# Patient Record
Sex: Male | Born: 1945 | Race: White | Hispanic: No | Marital: Married | Smoking: Never smoker
Health system: Southern US, Community
[De-identification: ages and names within clinical notes are randomized; demographics above are authoritative.]

## PROBLEM LIST (undated history)

## (undated) DIAGNOSIS — T4145XA Adverse effect of unspecified anesthetic, initial encounter: Secondary | ICD-10-CM

## (undated) DIAGNOSIS — R112 Nausea with vomiting, unspecified: Secondary | ICD-10-CM

## (undated) DIAGNOSIS — K8020 Calculus of gallbladder without cholecystitis without obstruction: Secondary | ICD-10-CM

## (undated) DIAGNOSIS — K219 Gastro-esophageal reflux disease without esophagitis: Secondary | ICD-10-CM

## (undated) DIAGNOSIS — Z9889 Other specified postprocedural states: Secondary | ICD-10-CM

## (undated) DIAGNOSIS — T8859XA Other complications of anesthesia, initial encounter: Secondary | ICD-10-CM

## (undated) DIAGNOSIS — B20 Human immunodeficiency virus [HIV] disease: Secondary | ICD-10-CM

## (undated) HISTORY — PX: MELANOMA EXCISION: SHX5266

## (undated) HISTORY — PX: EYE SURGERY: SHX253

## (undated) HISTORY — PX: INNER EAR SURGERY: SHX679

## (undated) HISTORY — PX: HERNIA REPAIR: SHX51

---

## 2001-11-27 ENCOUNTER — Encounter (INDEPENDENT_AMBULATORY_CARE_PROVIDER_SITE_OTHER): Payer: Self-pay | Admitting: *Deleted

## 2001-11-27 ENCOUNTER — Ambulatory Visit (HOSPITAL_COMMUNITY): Admission: RE | Admit: 2001-11-27 | Discharge: 2001-11-27 | Payer: Self-pay | Admitting: Internal Medicine

## 2001-11-27 ENCOUNTER — Encounter: Payer: Self-pay | Admitting: Internal Medicine

## 2001-11-27 ENCOUNTER — Encounter: Admission: RE | Admit: 2001-11-27 | Discharge: 2001-11-27 | Payer: Self-pay | Admitting: Internal Medicine

## 2001-11-27 LAB — CONVERTED CEMR LAB: CD4 Count: 110 microliters

## 2001-12-25 ENCOUNTER — Encounter: Admission: RE | Admit: 2001-12-25 | Discharge: 2001-12-25 | Payer: Self-pay | Admitting: Internal Medicine

## 2001-12-25 ENCOUNTER — Ambulatory Visit (HOSPITAL_COMMUNITY): Admission: RE | Admit: 2001-12-25 | Discharge: 2001-12-25 | Payer: Self-pay | Admitting: Internal Medicine

## 2002-01-08 ENCOUNTER — Encounter: Admission: RE | Admit: 2002-01-08 | Discharge: 2002-01-08 | Payer: Self-pay | Admitting: Internal Medicine

## 2002-03-19 ENCOUNTER — Encounter: Admission: RE | Admit: 2002-03-19 | Discharge: 2002-03-19 | Payer: Self-pay | Admitting: Internal Medicine

## 2002-03-19 ENCOUNTER — Ambulatory Visit (HOSPITAL_COMMUNITY): Admission: RE | Admit: 2002-03-19 | Discharge: 2002-03-19 | Payer: Self-pay | Admitting: Internal Medicine

## 2002-04-02 ENCOUNTER — Encounter: Admission: RE | Admit: 2002-04-02 | Discharge: 2002-04-02 | Payer: Self-pay | Admitting: Internal Medicine

## 2002-05-16 ENCOUNTER — Ambulatory Visit (HOSPITAL_COMMUNITY): Admission: RE | Admit: 2002-05-16 | Discharge: 2002-05-16 | Payer: Self-pay | Admitting: Internal Medicine

## 2002-05-16 ENCOUNTER — Encounter: Admission: RE | Admit: 2002-05-16 | Discharge: 2002-05-16 | Payer: Self-pay | Admitting: Internal Medicine

## 2002-06-04 ENCOUNTER — Encounter: Admission: RE | Admit: 2002-06-04 | Discharge: 2002-06-04 | Payer: Self-pay | Admitting: Internal Medicine

## 2002-07-16 ENCOUNTER — Ambulatory Visit (HOSPITAL_COMMUNITY): Admission: RE | Admit: 2002-07-16 | Discharge: 2002-07-16 | Payer: Self-pay | Admitting: Internal Medicine

## 2002-07-16 ENCOUNTER — Encounter: Admission: RE | Admit: 2002-07-16 | Discharge: 2002-07-16 | Payer: Self-pay | Admitting: Internal Medicine

## 2002-07-30 ENCOUNTER — Encounter: Admission: RE | Admit: 2002-07-30 | Discharge: 2002-07-30 | Payer: Self-pay | Admitting: Internal Medicine

## 2002-10-15 ENCOUNTER — Encounter: Admission: RE | Admit: 2002-10-15 | Discharge: 2002-10-15 | Payer: Self-pay | Admitting: Internal Medicine

## 2002-10-15 ENCOUNTER — Ambulatory Visit (HOSPITAL_COMMUNITY): Admission: RE | Admit: 2002-10-15 | Discharge: 2002-10-15 | Payer: Self-pay | Admitting: Internal Medicine

## 2002-10-29 ENCOUNTER — Encounter: Admission: RE | Admit: 2002-10-29 | Discharge: 2002-10-29 | Payer: Self-pay | Admitting: Internal Medicine

## 2003-02-11 ENCOUNTER — Encounter: Payer: Self-pay | Admitting: Internal Medicine

## 2003-02-11 ENCOUNTER — Encounter: Admission: RE | Admit: 2003-02-11 | Discharge: 2003-02-11 | Payer: Self-pay | Admitting: Internal Medicine

## 2003-02-25 ENCOUNTER — Encounter: Admission: RE | Admit: 2003-02-25 | Discharge: 2003-02-25 | Payer: Self-pay | Admitting: Internal Medicine

## 2003-06-24 ENCOUNTER — Encounter: Payer: Self-pay | Admitting: Internal Medicine

## 2003-06-24 ENCOUNTER — Ambulatory Visit (HOSPITAL_COMMUNITY): Admission: RE | Admit: 2003-06-24 | Discharge: 2003-06-24 | Payer: Self-pay | Admitting: Internal Medicine

## 2003-06-24 ENCOUNTER — Encounter: Admission: RE | Admit: 2003-06-24 | Discharge: 2003-06-24 | Payer: Self-pay | Admitting: Internal Medicine

## 2003-10-09 ENCOUNTER — Encounter (INDEPENDENT_AMBULATORY_CARE_PROVIDER_SITE_OTHER): Payer: Self-pay | Admitting: Specialist

## 2003-10-09 ENCOUNTER — Ambulatory Visit (HOSPITAL_COMMUNITY): Admission: RE | Admit: 2003-10-09 | Discharge: 2003-10-09 | Payer: Self-pay | Admitting: Gastroenterology

## 2003-11-18 ENCOUNTER — Ambulatory Visit (HOSPITAL_COMMUNITY): Admission: RE | Admit: 2003-11-18 | Discharge: 2003-11-18 | Payer: Self-pay | Admitting: Internal Medicine

## 2003-11-18 ENCOUNTER — Encounter: Admission: RE | Admit: 2003-11-18 | Discharge: 2003-11-18 | Payer: Self-pay | Admitting: Infectious Diseases

## 2003-12-02 ENCOUNTER — Encounter: Admission: RE | Admit: 2003-12-02 | Discharge: 2003-12-02 | Payer: Self-pay | Admitting: Internal Medicine

## 2004-06-08 ENCOUNTER — Encounter: Admission: RE | Admit: 2004-06-08 | Discharge: 2004-06-08 | Payer: Self-pay | Admitting: Internal Medicine

## 2004-06-08 ENCOUNTER — Ambulatory Visit (HOSPITAL_COMMUNITY): Admission: RE | Admit: 2004-06-08 | Discharge: 2004-06-08 | Payer: Self-pay | Admitting: Internal Medicine

## 2004-06-29 ENCOUNTER — Ambulatory Visit: Payer: Self-pay | Admitting: Internal Medicine

## 2004-11-16 ENCOUNTER — Ambulatory Visit (HOSPITAL_COMMUNITY): Admission: RE | Admit: 2004-11-16 | Discharge: 2004-11-16 | Payer: Self-pay | Admitting: Internal Medicine

## 2004-11-16 ENCOUNTER — Ambulatory Visit: Payer: Self-pay | Admitting: Internal Medicine

## 2004-11-30 ENCOUNTER — Ambulatory Visit: Payer: Self-pay | Admitting: Internal Medicine

## 2005-05-17 ENCOUNTER — Ambulatory Visit: Payer: Self-pay | Admitting: Internal Medicine

## 2005-05-17 ENCOUNTER — Ambulatory Visit (HOSPITAL_COMMUNITY): Admission: RE | Admit: 2005-05-17 | Discharge: 2005-05-17 | Payer: Self-pay | Admitting: Internal Medicine

## 2005-05-31 ENCOUNTER — Ambulatory Visit: Payer: Self-pay | Admitting: Internal Medicine

## 2005-08-30 ENCOUNTER — Encounter (INDEPENDENT_AMBULATORY_CARE_PROVIDER_SITE_OTHER): Payer: Self-pay | Admitting: Specialist

## 2005-08-30 ENCOUNTER — Ambulatory Visit (HOSPITAL_COMMUNITY): Admission: RE | Admit: 2005-08-30 | Discharge: 2005-08-30 | Payer: Self-pay | Admitting: Gastroenterology

## 2005-11-08 ENCOUNTER — Ambulatory Visit: Payer: Self-pay | Admitting: Internal Medicine

## 2005-11-08 ENCOUNTER — Encounter (INDEPENDENT_AMBULATORY_CARE_PROVIDER_SITE_OTHER): Payer: Self-pay | Admitting: *Deleted

## 2005-11-08 LAB — CONVERTED CEMR LAB
CD4 Count: 220 microliters
HIV 1 RNA Quant: 399 copies/mL

## 2005-11-22 ENCOUNTER — Ambulatory Visit: Payer: Self-pay | Admitting: Internal Medicine

## 2006-02-27 ENCOUNTER — Inpatient Hospital Stay (HOSPITAL_COMMUNITY): Admission: RE | Admit: 2006-02-27 | Discharge: 2006-03-05 | Payer: Self-pay | Admitting: Surgery

## 2006-02-27 ENCOUNTER — Encounter (INDEPENDENT_AMBULATORY_CARE_PROVIDER_SITE_OTHER): Payer: Self-pay | Admitting: *Deleted

## 2006-03-01 ENCOUNTER — Ambulatory Visit: Payer: Self-pay | Admitting: Infectious Diseases

## 2006-05-22 ENCOUNTER — Encounter (INDEPENDENT_AMBULATORY_CARE_PROVIDER_SITE_OTHER): Payer: Self-pay | Admitting: *Deleted

## 2006-05-22 ENCOUNTER — Encounter: Admission: RE | Admit: 2006-05-22 | Discharge: 2006-05-22 | Payer: Self-pay | Admitting: Internal Medicine

## 2006-05-22 ENCOUNTER — Ambulatory Visit: Payer: Self-pay | Admitting: Internal Medicine

## 2006-05-22 LAB — CONVERTED CEMR LAB
CD4 Count: 270 microliters
HIV 1 RNA Quant: 49 copies/mL

## 2006-06-13 ENCOUNTER — Ambulatory Visit: Payer: Self-pay | Admitting: Internal Medicine

## 2006-06-13 DIAGNOSIS — D126 Benign neoplasm of colon, unspecified: Secondary | ICD-10-CM

## 2006-11-07 ENCOUNTER — Encounter: Admission: RE | Admit: 2006-11-07 | Discharge: 2006-11-07 | Payer: Self-pay | Admitting: Internal Medicine

## 2006-11-07 ENCOUNTER — Ambulatory Visit: Payer: Self-pay | Admitting: Internal Medicine

## 2006-11-07 ENCOUNTER — Encounter (INDEPENDENT_AMBULATORY_CARE_PROVIDER_SITE_OTHER): Payer: Self-pay | Admitting: *Deleted

## 2006-11-07 LAB — CONVERTED CEMR LAB
ALT: 35 units/L (ref 0–53)
AST: 26 units/L (ref 0–37)
Albumin: 4.4 g/dL (ref 3.5–5.2)
Alkaline Phosphatase: 90 units/L (ref 39–117)
BUN: 10 mg/dL (ref 6–23)
Basophils Absolute: 0 10*3/uL (ref 0.0–0.1)
Basophils Relative: 0 % (ref 0–1)
CD4 Count: 210 microliters
CO2: 24 meq/L (ref 19–32)
Calcium: 8.7 mg/dL (ref 8.4–10.5)
Chloride: 106 meq/L (ref 96–112)
Cholesterol: 139 mg/dL (ref 0–200)
Creatinine, Ser: 1.12 mg/dL (ref 0.40–1.50)
Eosinophils Absolute: 0.1 10*3/uL (ref 0.0–0.7)
Eosinophils Relative: 2 % (ref 0–5)
Glucose, Bld: 107 mg/dL — ABNORMAL HIGH (ref 70–99)
HCT: 38.3 % — ABNORMAL LOW (ref 39.0–52.0)
HDL: 37 mg/dL — ABNORMAL LOW (ref >39)
HIV 1 RNA Quant: 86 copies/mL
HIV 1 RNA Quant: 86 copies/mL — ABNORMAL HIGH (ref <50)
HIV-1 RNA Quant, Log: 1.93 — ABNORMAL HIGH (ref <1.70)
Hemoglobin: 13.4 g/dL (ref 13.0–17.0)
LDL Cholesterol: 40 mg/dL (ref 0–99)
Lymphocytes Relative: 21 % (ref 12–46)
Lymphs Abs: 0.8 10*3/uL (ref 0.7–3.3)
MCHC: 35 g/dL (ref 30.0–36.0)
MCV: 102.7 fL — ABNORMAL HIGH (ref 78.0–100.0)
Monocytes Absolute: 0.2 10*3/uL (ref 0.2–0.7)
Monocytes Relative: 5 % (ref 3–11)
Neutro Abs: 2.8 10*3/uL (ref 1.7–7.7)
Neutrophils Relative %: 71 % (ref 43–77)
Platelets: 138 10*3/uL — ABNORMAL LOW (ref 150–400)
Potassium: 4.3 meq/L (ref 3.5–5.3)
RBC: 3.73 M/uL — ABNORMAL LOW (ref 4.22–5.81)
RDW: 14.9 % — ABNORMAL HIGH (ref 11.5–14.0)
Sodium: 143 meq/L (ref 135–145)
Total Bilirubin: 0.8 mg/dL (ref 0.3–1.2)
Total CHOL/HDL Ratio: 3.8
Total Protein: 6.7 g/dL (ref 6.0–8.3)
Triglycerides: 309 mg/dL — ABNORMAL HIGH (ref <150)
VLDL: 62 mg/dL — ABNORMAL HIGH (ref 0–40)
WBC: 3.9 10*3/uL — ABNORMAL LOW (ref 4.0–10.5)

## 2006-11-21 ENCOUNTER — Ambulatory Visit: Payer: Self-pay | Admitting: Internal Medicine

## 2006-12-04 ENCOUNTER — Encounter (INDEPENDENT_AMBULATORY_CARE_PROVIDER_SITE_OTHER): Payer: Self-pay | Admitting: *Deleted

## 2006-12-04 LAB — CONVERTED CEMR LAB

## 2006-12-17 ENCOUNTER — Encounter (INDEPENDENT_AMBULATORY_CARE_PROVIDER_SITE_OTHER): Payer: Self-pay | Admitting: *Deleted

## 2007-02-05 ENCOUNTER — Telehealth: Payer: Self-pay | Admitting: Internal Medicine

## 2007-06-12 ENCOUNTER — Ambulatory Visit: Payer: Self-pay | Admitting: Internal Medicine

## 2007-06-12 ENCOUNTER — Encounter: Admission: RE | Admit: 2007-06-12 | Discharge: 2007-06-12 | Payer: Self-pay | Admitting: Internal Medicine

## 2007-06-12 DIAGNOSIS — K219 Gastro-esophageal reflux disease without esophagitis: Secondary | ICD-10-CM | POA: Insufficient documentation

## 2007-06-12 DIAGNOSIS — Z21 Asymptomatic human immunodeficiency virus [HIV] infection status: Secondary | ICD-10-CM | POA: Insufficient documentation

## 2007-06-12 DIAGNOSIS — Z9889 Other specified postprocedural states: Secondary | ICD-10-CM | POA: Insufficient documentation

## 2007-06-12 DIAGNOSIS — B20 Human immunodeficiency virus [HIV] disease: Secondary | ICD-10-CM

## 2007-06-12 DIAGNOSIS — N400 Enlarged prostate without lower urinary tract symptoms: Secondary | ICD-10-CM

## 2007-06-12 DIAGNOSIS — Z8709 Personal history of other diseases of the respiratory system: Secondary | ICD-10-CM | POA: Insufficient documentation

## 2007-06-12 LAB — CONVERTED CEMR LAB
Albumin: 4.7 g/dL (ref 3.5–5.2)
BUN: 20 mg/dL (ref 6–23)
CO2: 24 meq/L (ref 19–32)
Calcium: 8.6 mg/dL (ref 8.4–10.5)
Chloride: 102 meq/L (ref 96–112)
Glucose, Bld: 109 mg/dL — ABNORMAL HIGH (ref 70–99)
HIV 1 RNA Quant: 140 copies/mL — ABNORMAL HIGH (ref <50)
HIV-1 RNA Quant, Log: 2.15 — ABNORMAL HIGH (ref <1.70)
Hemoglobin: 13.1 g/dL (ref 13.0–17.0)
Lymphs Abs: 0.8 10*3/uL (ref 0.7–3.3)
Monocytes Relative: 6 % (ref 3–11)
Neutro Abs: 3.1 10*3/uL (ref 1.7–7.7)
Neutrophils Relative %: 75 % (ref 43–77)
Potassium: 4.2 meq/L (ref 3.5–5.3)
RBC: 3.56 M/uL — ABNORMAL LOW (ref 4.22–5.81)
WBC: 4.1 10*3/uL (ref 4.0–10.5)

## 2007-06-26 ENCOUNTER — Ambulatory Visit: Payer: Self-pay | Admitting: Internal Medicine

## 2007-08-13 ENCOUNTER — Telehealth: Payer: Self-pay | Admitting: Internal Medicine

## 2007-11-27 ENCOUNTER — Ambulatory Visit: Payer: Self-pay | Admitting: Internal Medicine

## 2007-11-27 ENCOUNTER — Encounter: Admission: RE | Admit: 2007-11-27 | Discharge: 2007-11-27 | Payer: Self-pay | Admitting: Internal Medicine

## 2007-11-27 LAB — CONVERTED CEMR LAB
AST: 27 units/L (ref 0–37)
Albumin: 4.5 g/dL (ref 3.5–5.2)
Alkaline Phosphatase: 77 units/L (ref 39–117)
BUN: 15 mg/dL (ref 6–23)
Calcium: 8.8 mg/dL (ref 8.4–10.5)
Chloride: 103 meq/L (ref 96–112)
Creatinine, Ser: 1.23 mg/dL (ref 0.40–1.50)
Glucose, Bld: 116 mg/dL — ABNORMAL HIGH (ref 70–99)
HDL: 32 mg/dL — ABNORMAL LOW (ref >39)
MCV: 101.9 fL — ABNORMAL HIGH (ref 78.0–100.0)
Platelets: 139 10*3/uL — ABNORMAL LOW (ref 150–400)
Sodium: 142 meq/L (ref 135–145)
Total CHOL/HDL Ratio: 4.6
Triglycerides: 402 mg/dL — ABNORMAL HIGH (ref <150)
WBC: 4.3 10*3/uL (ref 4.0–10.5)

## 2007-12-06 ENCOUNTER — Encounter (INDEPENDENT_AMBULATORY_CARE_PROVIDER_SITE_OTHER): Payer: Self-pay | Admitting: *Deleted

## 2007-12-31 ENCOUNTER — Ambulatory Visit: Payer: Self-pay | Admitting: Internal Medicine

## 2007-12-31 DIAGNOSIS — E785 Hyperlipidemia, unspecified: Secondary | ICD-10-CM

## 2008-01-04 ENCOUNTER — Encounter: Payer: Self-pay | Admitting: Internal Medicine

## 2008-01-14 ENCOUNTER — Telehealth: Payer: Self-pay | Admitting: Internal Medicine

## 2008-02-05 ENCOUNTER — Encounter: Payer: Self-pay | Admitting: Internal Medicine

## 2008-06-17 ENCOUNTER — Ambulatory Visit: Payer: Self-pay | Admitting: Internal Medicine

## 2008-06-17 LAB — CONVERTED CEMR LAB
BUN: 23 mg/dL (ref 6–23)
CO2: 26 meq/L (ref 19–32)
Glucose, Bld: 99 mg/dL (ref 70–99)
HIV 1 RNA Quant: 460 copies/mL — ABNORMAL HIGH (ref <50)
HIV-1 RNA Quant, Log: 2.66 — ABNORMAL HIGH (ref <1.70)
LDL Cholesterol: 52 mg/dL (ref 0–99)
Potassium: 4.2 meq/L (ref 3.5–5.3)
Sodium: 141 meq/L (ref 135–145)
Total CHOL/HDL Ratio: 4.6
VLDL: 66 mg/dL — ABNORMAL HIGH (ref 0–40)

## 2008-07-01 ENCOUNTER — Ambulatory Visit: Payer: Self-pay | Admitting: Internal Medicine

## 2008-09-24 ENCOUNTER — Ambulatory Visit: Payer: Self-pay | Admitting: Internal Medicine

## 2008-09-24 LAB — CONVERTED CEMR LAB
HIV 1 RNA Quant: 173 copies/mL — ABNORMAL HIGH (ref <48)
HIV-1 RNA Quant, Log: 2.24 — ABNORMAL HIGH (ref <1.68)

## 2008-10-10 HISTORY — PX: POLYPECTOMY: SHX149

## 2008-10-21 ENCOUNTER — Ambulatory Visit: Payer: Self-pay | Admitting: Internal Medicine

## 2009-03-27 ENCOUNTER — Ambulatory Visit: Payer: Self-pay | Admitting: Internal Medicine

## 2009-03-27 LAB — CONVERTED CEMR LAB
AST: 27 units/L (ref 0–37)
Albumin: 4.9 g/dL (ref 3.5–5.2)
Alkaline Phosphatase: 81 units/L (ref 39–117)
BUN: 16 mg/dL (ref 6–23)
Creatinine, Ser: 1.37 mg/dL (ref 0.40–1.50)
Eosinophils Relative: 1 % (ref 0–5)
GFR calc non Af Amer: 53 mL/min — ABNORMAL LOW (ref >60)
Glucose, Bld: 137 mg/dL — ABNORMAL HIGH (ref 70–99)
HCT: 36 % — ABNORMAL LOW (ref 39.0–52.0)
HDL: 38 mg/dL — ABNORMAL LOW (ref >39)
Hemoglobin: 12.6 g/dL — ABNORMAL LOW (ref 13.0–17.0)
LDL Cholesterol: 51 mg/dL (ref 0–99)
Lymphocytes Relative: 20 % (ref 12–46)
Lymphs Abs: 0.8 10*3/uL (ref 0.7–4.0)
Monocytes Relative: 5 % (ref 3–12)
Platelets: 120 10*3/uL — ABNORMAL LOW (ref 150–400)
RBC: 3.44 M/uL — ABNORMAL LOW (ref 4.22–5.81)
Total Bilirubin: 0.8 mg/dL (ref 0.3–1.2)
Total CHOL/HDL Ratio: 3.5
Triglycerides: 219 mg/dL — ABNORMAL HIGH (ref <150)
WBC: 3.9 10*3/uL — ABNORMAL LOW (ref 4.0–10.5)

## 2009-04-09 ENCOUNTER — Ambulatory Visit: Payer: Self-pay | Admitting: Internal Medicine

## 2009-04-09 DIAGNOSIS — R7309 Other abnormal glucose: Secondary | ICD-10-CM

## 2009-10-15 ENCOUNTER — Ambulatory Visit: Payer: Self-pay | Admitting: Internal Medicine

## 2009-10-15 LAB — CONVERTED CEMR LAB
Albumin: 4.1 g/dL (ref 3.5–5.2)
CO2: 23 meq/L (ref 19–32)
Cholesterol: 125 mg/dL (ref 0–200)
Glucose, Bld: 118 mg/dL — ABNORMAL HIGH (ref 70–99)
HDL: 31 mg/dL — ABNORMAL LOW (ref >39)
HIV 1 RNA Quant: <48 copies/mL (ref <48)
Lymphocytes Relative: 22 % (ref 12–46)
Lymphs Abs: 0.8 10*3/uL (ref 0.7–4.0)
Monocytes Relative: 6 % (ref 3–12)
Neutro Abs: 2.8 10*3/uL (ref 1.7–7.7)
Neutrophils Relative %: 71 % (ref 43–77)
Potassium: 4.5 meq/L (ref 3.5–5.3)
RBC: 3.77 M/uL — ABNORMAL LOW (ref 4.22–5.81)
Sodium: 139 meq/L (ref 135–145)
Total Bilirubin: 0.7 mg/dL (ref 0.3–1.2)
Total CHOL/HDL Ratio: 4
Total Protein: 6.6 g/dL (ref 6.0–8.3)
Triglycerides: 286 mg/dL — ABNORMAL HIGH (ref <150)
WBC: 3.9 10*3/uL — ABNORMAL LOW (ref 4.0–10.5)

## 2009-11-10 ENCOUNTER — Ambulatory Visit: Payer: Self-pay | Admitting: Internal Medicine

## 2010-03-16 ENCOUNTER — Encounter (INDEPENDENT_AMBULATORY_CARE_PROVIDER_SITE_OTHER): Payer: Self-pay | Admitting: *Deleted

## 2010-07-07 ENCOUNTER — Encounter: Payer: Self-pay | Admitting: Internal Medicine

## 2010-07-08 ENCOUNTER — Encounter: Payer: Self-pay | Admitting: Internal Medicine

## 2010-07-13 ENCOUNTER — Ambulatory Visit: Payer: Self-pay | Admitting: Internal Medicine

## 2010-07-13 LAB — CONVERTED CEMR LAB
BUN: 15 mg/dL (ref 6–23)
Chloride: 108 meq/L (ref 96–112)
Glucose, Bld: 96 mg/dL (ref 70–99)
LDL Cholesterol: 32 mg/dL (ref 0–99)
Potassium: 4 meq/L (ref 3.5–5.3)
Triglycerides: 305 mg/dL — ABNORMAL HIGH (ref <150)
VLDL: 61 mg/dL — ABNORMAL HIGH (ref 0–40)

## 2010-07-27 ENCOUNTER — Ambulatory Visit: Payer: Self-pay | Admitting: Internal Medicine

## 2010-08-30 ENCOUNTER — Encounter (INDEPENDENT_AMBULATORY_CARE_PROVIDER_SITE_OTHER): Payer: Self-pay | Admitting: *Deleted

## 2010-11-09 NOTE — Miscellaneous (Signed)
Summary: clinical update/ryan white  Clinical Lists Changes  Observations: Added new observation of RW VITAL STA: Active (03/16/2010 16:30) Added new observation of PCTFPL: 587.53  (03/16/2010 16:30) Added new observation of YEARLYEXPEN: 61443  (03/16/2010 16:30) Added new observation of HOUSEINCOME: 15400  (03/16/2010 16:30) Added new observation of FINASSESSDT: 11/20/2009  (03/16/2010 16:30)

## 2010-11-09 NOTE — Miscellaneous (Signed)
  Clinical Lists Changes  Observations: Added new observation of YEARAIDSPOS: 2003  (08/30/2010 13:42)

## 2010-11-09 NOTE — Assessment & Plan Note (Signed)
Summary: Brett Sims   CC:  follow-up visit.  History of Present Illness:   He is in for his routine visit.  He has not changed any of his medications and is tolerating them well.  He does not recall missing a single dose since his last visit he continues to take one dose of Phenergan daily two counteract nausea related to his medications.  He also continues to take his Aciphex which he says controls his GERD symptoms.  Preventive Screening-Counseling & Management  Alcohol-Tobacco     Alcohol drinks/day: <1     Alcohol type: wine     Smoking Status: never     Passive Smoke Exposure: no  Caffeine-Diet-Exercise     Caffeine use/day: yes     Does Patient Exercise: yes     Type of exercise: treadmilkl, weights     Exercise (avg: min/session): >60     Times/week: 3  Hep-HIV-STD-Contraception     HIV Risk: no risk noted  Safety-Violence-Falls     Seat Belt Use: yes  Comments: condoms declines      Sexual History:  currently monogamous and no change.        Drug Use:  never.     Prior Medication List:  ZERIT 40 MG CAPS (STAVUDINE) Take 1 capsule by mouth two times a day TRUVADA 200-300 MG TABS (EMTRICITABINE-TENOFOVIR) Take 1 tablet by mouth once a day VIRAMUNE 200 MG  TABS (NEVIRAPINE) Take 1 tablet by mouth two times a day ACIPHEX 20 MG  TBEC (RABEPRAZOLE SODIUM) Take 1 tablet by mouth once a day PROMETHAZINE HCL 12.5 MG  TABS (PROMETHAZINE HCL) Take 1 tablet by mouth four times a day as needed IMODIUM A-D 2 MG  TABS (LOPERAMIDE HCL) Take 1 tablet by mouth once a day as needed PROCHLORPERAZINE MALEATE 5 MG  TABS (PROCHLORPERAZINE MALEATE) Take 1 tablet by mouth at bedtime as needed   Updated Prior Medication List: ZERIT 40 MG CAPS (STAVUDINE) Take 1 capsule by mouth two times a day TRUVADA 200-300 MG TABS (EMTRICITABINE-TENOFOVIR) Take 1 tablet by mouth once a day VIRAMUNE 200 MG  TABS (NEVIRAPINE) Take 1 tablet by mouth two times a day ACIPHEX 20 MG  TBEC (RABEPRAZOLE SODIUM)  Take 1 tablet by mouth once a day PROMETHAZINE HCL 12.5 MG  TABS (PROMETHAZINE HCL) Take 1 tablet by mouth four times a day as needed IMODIUM A-D 2 MG  TABS (LOPERAMIDE HCL) Take 1 tablet by mouth once a day as needed PROCHLORPERAZINE MALEATE 5 MG  TABS (PROCHLORPERAZINE MALEATE) Take 1 tablet by mouth at bedtime as needed  Current Allergies (reviewed today): ! ERYTHROMYCIN ! SULFA ! CODEINE ! * SEAFOOD Social History: Sexual History:  currently monogamous, no change  Vital Signs:  Patient profile:   65 year old male Height:      71 inches (180.34 cm) Weight:      177.4 pounds (80.64 kg) BMI:     24.83 Temp:     97.1 degrees F (36.17 degrees C) oral Pulse rate:   87 / minute BP sitting:   137 / 87  (right arm) Cuff size:   large  Vitals Entered By: Jennet Maduro RN (November 10, 2009 10:05 AM) CC: follow-up visit Is Patient Diabetic? No Pain Assessment Patient in pain? no      Nutritional Status BMI of 19 -24 = normal Nutritional Status Detail appetite "good"  Have you ever been in a relationship where you felt threatened, Forti or afraid?No   Does patient  need assistance? Functional Status Self care Ambulation Normal Comments missed one dose of rx two wks ago.   Physical Exam  General:  alert and well-nourished.   Mouth:  good dentition and pharynx pink and moist.   Lungs:  normal breath sounds.  no crackles and no wheezes.   Heart:  normal rate, regular rhythm, and no murmur.   Abdomen:  soft, non-tender, normal bowel sounds, and no masses.   Skin:  no rashes.          Medication Adherence: 11/10/2009   Adherence to medications reviewed with patient. Counseling to provide adequate adherence provided                                 Impression & Recommendations:  Problem # 1:  HIV DISEASE (ICD-042) He has not had significant CD4 reconstitution in the last several years but his viral load is low and stable so will not make any changes.  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 Diagnostics Reviewed:  HIV: CDC-defined AIDS (07/01/2008)   CD4: 210 (10/16/2009)   WBC: 3.9 (10/15/2009)   Hgb: 13.8 (10/15/2009)   HCT: 42.7 (10/15/2009)   Platelets: 102 (10/15/2009) HIV-1 RNA: <48 copies/mL (10/15/2009)   HBSAg: NO (12/04/2006)  Problem # 2:  HYPERGLYCEMIA (ICD-790.29)  He has mild chronic hyperglycemia without evidence of diabetes.  His hemoglobin A1c last summer was 4.2. Orders: Est. Patient Level IV (13086)  Labs Reviewed: Creat: 1.16 (10/15/2009)     Problem # 3:  HYPERLIPIDEMIA (ICD-272.4) He has mild dyslipidemia. We will follow this at this time. Orders: Est. Patient Level IV (57846)  Other Orders: Future Orders: T-CD4SP ([facility]) (CD4SP) ... 05/09/2010 T-HIV Viral Load [id number]) ... 05/09/2010 T-Basic Metabolic Panel [id number]) ... 05/09/2010 T-Lipid Profile [id number]) ... 05/09/2010  Patient Instructions: 1)  Please schedule a follow-up appointment in 6 months. Process Orders Check Orders Results:     [facility]: ABN not required for this insurance Tests Sent for requisitioning (November 10, 2009 10:23 AM):     05/09/2010: [facility] -- T-HIV Viral Load [phone] (signed)     05/09/2010: [facility] -- T-Basic Metabolic Panel [phone] (signed)     05/09/2010: [facility] -- T-Lipid Profile [phone] (signed)         Medication Adherence: 11/10/2009   Adherence to medications reviewed with patient. Counseling to provide adequate adherence provided                                Influenza Immunization History:    Influenza # 1:  Historical (07/06/2009)

## 2010-11-09 NOTE — Assessment & Plan Note (Signed)
Summary: f/u [mkj]   CC:  follow-up visit.  History of Present Illness: Brett Sims is in for his routine visit.  As usual he never misses a single dose of his medications.  He is enjoying his retirement.  He remains very active with traveling and is a regular visitor to the gym.  Preventive Screening-Counseling & Management  Alcohol-Tobacco     Alcohol drinks/day: <1     Alcohol type: wine     Smoking Status: never     Passive Smoke Exposure: no  Caffeine-Diet-Exercise     Caffeine use/day: yes     Does Patient Exercise: yes     Type of exercise: treadmilkl, weights     Exercise (avg: min/session): >60     Times/week: 3  Hep-HIV-STD-Contraception     HIV Risk: no risk noted  Safety-Violence-Falls     Seat Belt Use: yes  Comments: declined condoms      Drug Use:  never.     Prior Medication List:  ZERIT 40 MG CAPS (STAVUDINE) Take 1 capsule by mouth two times a day TRUVADA 200-300 MG TABS (EMTRICITABINE-TENOFOVIR) Take 1 tablet by mouth once a day VIRAMUNE 200 MG  TABS (NEVIRAPINE) Take 1 tablet by mouth two times a day ACIPHEX 20 MG  TBEC (RABEPRAZOLE SODIUM) Take 1 tablet by mouth once a day PROMETHAZINE HCL 12.5 MG  TABS (PROMETHAZINE HCL) Take 1 tablet by mouth four times a day as needed IMODIUM A-D 2 MG  TABS (LOPERAMIDE HCL) Take 1 tablet by mouth once a day as needed PROCHLORPERAZINE MALEATE 5 MG  TABS (PROCHLORPERAZINE MALEATE) Take 1 tablet by mouth at bedtime as needed   Current Allergies (reviewed today): ! ERYTHROMYCIN ! SULFA ! CODEINE ! * SEAFOOD Vital Signs:  Patient profile:   65 year old male Height:      71 inches (180.34 cm) Weight:      176.25 pounds (80.11 kg) BMI:     24.67 Temp:     98. degrees F (36.67 degrees C) oral Pulse rate:   91 / minute BP sitting:   143 / 85  (left arm) Cuff size:   regular  Vitals Entered By: Jennet Maduro RN (July 27, 2010 10:26 AM) CC: follow-up visit Is Patient Diabetic? No Pain Assessment Patient in  pain? no      Nutritional Status BMI of 19 -24 = normal Nutritional Status Detail appetite "very good"  Have you ever been in a relationship where you felt threatened, Sims or afraid?No   Does patient need assistance? Functional Status Self care Ambulation Normal Comments no missed doses of rxes   Physical Exam  General:  alert and overweight-appearing.  he has a little more central adiposity. Mouth:  good dentition and pharynx pink and moist.   Lungs:  normal breath sounds.  no crackles and no wheezes.   Heart:  normal rate, regular rhythm, and no murmur.      Impression & Recommendations:  Problem # 1:  HIV DISEASE (ICD-042) His infection remains under excellent control.  I will not make any changes today. Diagnostics Reviewed:  HIV: CDC-defined AIDS (07/01/2008)   CD4: 310 (07/15/2010)   WBC: 3.9 (10/15/2009)   Hgb: 13.8 (10/15/2009)   HCT: 42.7 (10/15/2009)   Platelets: 102 (10/15/2009) HIV-1 RNA: <20 copies/mL (07/13/2010)   HBSAg: NO (12/04/2006)  Problem # 2:  HYPERLIPIDEMIA (ICD-272.4) His triglyceride level remains elevated.  I suggested that he try fish oil capsules.  I will repeat his levels before his next  visit. Orders: Est. Patient Level III (86578)  Other Orders: Influenza Vaccine NON MCR (46962) Future Orders: T-CD4SP ([facility]) (CD4SP) ... 01/23/2011 T-HIV Viral Load [id number]) ... 01/23/2011 T-Comprehensive Metabolic Panel [id number]) ... 01/23/2011 T-CBC w/Diff ([id number]) ... 01/23/2011 T-RPR (Syphilis) [id number]) ... 01/23/2011 T-Lipid Profile [id number]) ... 01/23/2011  Patient Instructions: 1)  Please schedule a follow-up appointment in 6 months.    Influenza Vaccine    Vaccine Type: Fluvax Non-MCR    Site: left deltoid    Mfr: novartis    Dose: 0.5 ml    Route: IM    Given by: Jennet Maduro RN    Exp. Date: 01/09/2011    Lot #: [id number]  Flu Vaccine Consent Questions    Do you have a history of severe allergic reactions  to this vaccine? no    Any prior history of allergic reactions to egg and/or gelatin? no    Do you have a sensitivity to the preservative Thimersol? no    Do you have a past history of Guillan-Barre Syndrome? no    Do you currently have an acute febrile illness? no    Have you ever had a severe reaction to latex? no    Vaccine information given and explained to patient? yes

## 2010-11-09 NOTE — Medication Information (Signed)
Summary: [facility]   Imported By: Florinda Marker 07/08/2010 16:03:04  _____________________________________________________________________  External Attachment:    Type:   Image     Comment:   External Document

## 2010-11-09 NOTE — Medication Information (Signed)
Summary: [employer]   Imported By: Florinda Marker 07/08/2010 15:07:41  _____________________________________________________________________  External Attachment:    Type:   Image     Comment:   External Document

## 2010-12-22 LAB — T-HELPER CELL (CD4) - (RCID CLINIC ONLY): CD4 T Cell Abs: 310 uL — ABNORMAL LOW (ref 400–2700)

## 2011-01-16 LAB — T-HELPER CELL (CD4) - (RCID CLINIC ONLY): CD4 T Cell Abs: 210 uL — ABNORMAL LOW (ref 400–2700)

## 2011-01-17 LAB — T-HELPER CELL (CD4) - (RCID CLINIC ONLY): CD4 % Helper T Cell: 24 % — ABNORMAL LOW (ref 33–55)

## 2011-02-25 NOTE — Op Note (Signed)
NAME:  Brett Sims, Brett Sims NO.:  [id number]   MEDICAL RECORD NO.:  [id number]          PATIENT TYPE:  INP   LOCATION:  1421                         FACILITY:  [facility]   PHYSICIAN:  Thornton Park. Daphine Deutscher, MD  DATE OF BIRTH:  02-Jun-1946   DATE OF PROCEDURE:  02/27/2006  DATE OF DISCHARGE:                                 OPERATIVE REPORT   PREOPERATIVE DIAGNOSIS:  Villous adenoma of the transverse colon.   POSTOPERATIVE DIAGNOSIS:  Villous adenoma of the transverse colon.   PROCEDURE:  Laparoscopic partial transverse colectomy.   SURGEON:  Danise Edge, M.D.   ASSISTANT:  None.   DESCRIPTION OF PROCEDURE:  Brett Sims underwent a colonoscopic  localization both with tattooing and an x-ray today  prior to this procedure  which was sort of put off until later in the day in the day so we could get  the repeat colonoscopy.  This area was tattooed, and x-ray showed this to  probably be in the transverse colon more toward the hepatic flexure than the  splenic flexure were it was initially thought.   Access was obtained in the abdomen using an OptiVu technique through a 5-mm  left subcostal port and then two were placed below that.  The abdomen was  insufflated.  This was accomplished without difficulty.  The tattooed area  was noted and was very prominent in the transverse colon.  This tended to be  more toward the hepatic flexure, and I went ahead and took down the hepatic  flexure using a harmonic scalpel.  The patient has very friable tissue, and  this may reflect his underlying disease.  However, I did mobilize the  transverse colon in took the omentum down off the area of the tattooing.  We  went ahead and mobilized colon and felt like it was fairly redundant and  would move and easily come up to the anterior abdominal wall, even with the  abdomen distended.  I then marked an area overlying this where I deflated  the abdomen and made a small transverse incision.  I  put in the wound  retractor which also served as a barrier to the fascia.  I brought out the  colon easily.  I used a harmonic scalpel proximally and distally to free the  ends and get beneath it and then it divide with a GIA.  I felt like I had at  least a 2-cm margin on either side of the area.  As it turned out, when it  was inspected, it was probably about a 2.5-cm proximal margin and about a 4-  cm distal margin.  I went through the mesentery with Harmonic scalpel, and  bleeding was controlled.  Specimen was removed, opened and checked, and the  lesion was seen within it.   The bowel ends were then approximated with 3-0 silks posteriorly.  The  staple lines were I divided with the GIA were cut.  An inner layer of  running 4-0 PDS was used in a running locking fashion, carried anteriorly in  a canal Mayo fashion to complete  an inner layer anastomosis.  The anterior  layer of 4-0 silks were applied.  It was essentially not much of mesenteric  defect to close.  Two-layer anastomosis was completed without spillage and  looked hemostatic and appeared to be airtight as well.   I irrigated, and there was no bleeding noted.  I removed the wound protector  and changed my gloves.  I then irrigated again.  I injected the subfascial  region with 10 cc of Marcaine.  I then closed the wound with a running #1  PDS from either side and in two layers of #1 PDS.  Wounds were then closed  with staples.  I had previously withdrawn the 5-mm trocars, and these wounds  were closed with staples as well.  The patient seemed to tolerate the  procedure well and was taken to recovery room in satisfactory condition.      Thornton Park Daphine Deutscher, MD  Electronically Signed     MBM/MEDQ  D:  02/27/2006  T:  02/28/2006  Job:  [id number]   cc:   Danise Edge, M.D.  Fax: [phone]   Cliffton Asters, M.D.  Fax: [phone]

## 2011-02-25 NOTE — Discharge Summary (Signed)
NAME:  Brett Sims, Brett Sims NO.:  [id number]   MEDICAL RECORD NO.:  [id number]          PATIENT TYPE:  INP   LOCATION:  1421                         FACILITY:  [facility]   PHYSICIAN:  Thornton Park. Daphine Deutscher, MD  DATE OF BIRTH:  03/05/1946   DATE OF ADMISSION:  02/27/2006  DATE OF DISCHARGE:  03/05/2006                                 DISCHARGE SUMMARY   PREOPERATIVE DIAGNOSIS:  Villous adenoma of the transverse colon.   DISCHARGE DIAGNOSIS:  Villous adenoma 1.5 cm, no high-grade dysplasia or  malignancy identified, with negative surgical margins.   PROCEDURE:  Laparoscopically assisted transverse colectomy.   COURSE IN THE HOSPITAL:  Brett Sims is a 65 year old gentleman who is  being successfully treated for HIV disease, but on colonoscopy found to have  a mass in the transverse colon.  This area was tattooed, as it was felt to  be unresectable colonoscopically.  The patient was brought in to the  hospital and underwent a laparoscopically assisted transverse colectomy.  He  did have some fevers postop, and Dr. Lenn Sink treated with vancomycin  and Primaxin.  He stayed on these for few days, and actually there was some  question of whether he had cellulitis in the wound, but this seemed to clear  up.  He began passing gas and actually did very well.  He was discharged  home on Mar 05, 2006 with no erythema of the wound and off antibiotics, and  he was passing gas and taking p.o.'s.  He will be followed up in the office  in 7-10 days.      Thornton Park Daphine Deutscher, MD  Electronically Signed     MBM/MEDQ  D:  04/10/2006  T:  04/10/2006  Job:  [id number]

## 2011-02-25 NOTE — Op Note (Signed)
NAME:  Brett Sims, Brett Sims NO.:  [id number]   MEDICAL RECORD NO.:  [id number]                   PATIENT TYPE:  AMB   LOCATION:  ENDO                                 FACILITY:  [facility]   PHYSICIAN:  Danise Edge, M.D.                DATE OF BIRTH:  11-08-1945   DATE OF PROCEDURE:  10/09/2003  DATE OF DISCHARGE:                                 OPERATIVE REPORT   PROCEDURE PERFORMED:  Esophagogastroduodenoscopy.   ENDOSCOPIST:  Charolett Bumpers, M.D.   INDICATIONS FOR PROCEDURE:  Brett Sims is a 65 year old male born 1945/12/19.  Brett Sims has unexplained nausea and vomiting.  He has chronic  gastroesophageal reflux and is currently taking double dose proton pump  inhibitor therapy.  He denies heartburn, dysphagia or odynophagia.   PREMEDICATION:  Versed 7.5 mg, Demerol 50 mg.   DESCRIPTION OF PROCEDURE:  After obtaining informed consent, Brett Sims was  placed in the left lateral decubitus position.  I administered intravenous  Demerol and intravenous Versed to achieve conscious sedation for the  procedure.  The patient's blood pressure, oxygen saturations and cardiac  rhythm were monitored throughout the procedure and documented in the medical  record.   The Olympus gastroscope was passed through the posterior hypopharynx and the  proximal esophagus without difficulty.  The hypopharynx, larynx and vocal  cords appeared normal.   Esophagoscopy:  The proximal, mid and lower segments of the esophageal  mucosa appear normal.  The squamocolumnar junction and the esophagogastric  junction are noted at 40 cm from the incisor teeth.   Gastroscopy:  Retroflex view of the gastric cardia reveals an inflamed  appearing isolated fold in the gastric cardia which was biopsied.  The  gastric fundus appears normal on retroflexion.  The gastric body, antrum and  pylorus appear normal.  A biopsy was taken from the distal gastric antrum  for CLO test.   Duodenoscopy:  The duodenal bulb, mid duodenum and distal duodenum appear  normal.  Multiple biopsies were taken from the second-third portion of the  duodenum.   ASSESSMENT:  Essentially normal esophagogastroduodenoscopy.  A small  inflamed appearing fold in the gastric cardia was biopsied but I suspect it  has no clinical significance.  A biopsy was taken from the distal gastric  antrum for CLO test.  Small bowel biopsies were obtained.                                               Danise Edge, M.D.    MJ/MEDQ  D:  10/09/2003  T:  10/09/2003  Job:  [id number]   cc:   Cliffton Asters, M.D.  [address] [place]  [place] [address]  Fax: [phone]

## 2011-02-25 NOTE — Op Note (Signed)
NAME:  Brett Sims, Brett Sims NO.:  [id number]   MEDICAL RECORD NO.:  [id number]          PATIENT TYPE:  INP   LOCATION:  1421                         FACILITY:  [facility]   PHYSICIAN:  Danise Edge, M.D.   DATE OF BIRTH:  November 28, 1945   DATE OF PROCEDURE:  02/27/2006  DATE OF DISCHARGE:                                 OPERATIVE REPORT   PROCEDURE:  Proctocolonoscopy to the hepatic flexure.   PROCEDURE INDICATIONS:  Brett Sims is a 65 year old male, born  June 23, 1943, __________ with polypectomy to prevent colon cancer.  At  70 cm from the anal verge, probably in the area of the splenic flexure, a 3-  cm sessile polyp was lifted by submucosal saline injection and removed in  piecemeal fashion with the electrocautery snare, __________.   At 1 year, a repeat colonoscopy was performed revealing residual polypoid  tissue at 70 cm from the anal verge.  As much of the polyp as I could was  removed with the electrocautery snare and again returned villous adenoma.   A third __________ villous tissue, which I felt could not be completely  removed endoscopically, and the area was tattooed with [place] ink in  preparation for surgery.   Brett Sims is scheduled for surgery today.  Preoperatively, Dr. Wenda Low  requested to be a repeat tattooing at a polyp site and requested that an x-  ray be done of the colonoscope at the level of the polyp.   ENDOSCOPIST:  Danise Edge, M.D.   PREMEDICATION:  Fentanyl 100 mcg, Versed 10 mg.   PROCEDURE:  After obtaining informed consent, Brett Sims was placed in the  left lateral decubitus position.  I administered intravenous fentanyl and  intravenous Versed to achieve conscious sedation for the procedure.  The  patient's blood pressure, oxygen saturation and cardiac rhythm were  monitored throughout the procedure and documented in the medical record.   Anal inspection was normal.  The Olympus adjustable pediatric colonoscope  was introduced into the rectum and advanced to the hepatic flexure.  Colonic  preparation for the exam today was satisfactory.   RECTUM:  Normal.   SIGMOID COLON AND DESCENDING COLON:  Left colonic diverticulosis.   SPLENIC FLEXURE:  At approximately 70 cm from the anal verge, in the area of  the splenic flexure, a 1.5-cm sessile polyp was encountered associated with  [place] ink tattooing.  Further tattooing was performed in a circumferential  manner at the level of the polyp.  A KUB x-ray was then obtained with the  tip of the endoscope at the level of the polyp.   TRANSVERSE COLON:  Normal.   HEPATIC FLEXURE:  Normal.   ASSESSMENT:  Villous adenoma of the colon at the splenic flexure.           ______________________________  Danise Edge, M.D.     MJ/MEDQ  D:  02/27/2006  T:  02/28/2006  Job:  [id number]   cc:   Thornton Park Daphine Deutscher, MD  [address]  [place]  [place] [address]

## 2011-02-25 NOTE — Op Note (Signed)
NAME:  Brett Sims, Brett Sims NO.:  [id number]   MEDICAL RECORD NO.:  [id number]          PATIENT TYPE:  AMB   LOCATION:  ENDO                         FACILITY:  [facility]   PHYSICIAN:  Danise Edge, M.D.   DATE OF BIRTH:  [birth year]   DATE OF PROCEDURE:  08/30/2005  DATE OF DISCHARGE:                                 OPERATIVE REPORT   PROCEDURE:  Colonoscopy with polypectomy.   INDICATIONS FOR PROCEDURE:  Mr. Inri Sobieski is a 65 year old male born 1946/08/03.  Mr. Grandison is scheduled to undergo a screening colonoscopy with  polypectomy to prevent colon cancer.   ENDOSCOPIST:  Danise Edge, M.D.   PREMEDICATION:  Versed 10 mg, Demerol 100 mg.   PROCEDURE:  After obtaining informed consent, Mr. Stepanek was placed in the  left lateral decubitus position.  I administered intravenous Demerol and  intravenous Versed to achieve conscious sedation for the procedure.  The  patient's blood pressure, oxygen saturation, and cardiac rhythm were  monitored throughout the procedure and documented in the medical record.   Anal inspection was normal.  Digital rectal exam reveals a nonnodular  prostate.  The Olympus adjustable pediatric colonoscope was introduced into  the rectum and easily advanced to the cecum.  A normal-appearing ileocecal  valve and appendiceal orifice were identified.  Colonic preparation for the  exam today was excellent.   RECTUM:  Normal.  Retroflexed view of the distal rectum normal.   SIGMOID COLON/DESCENDING COLON:  At 60-70 cm from the anal verge, a 3 cm  sessile polyp behind a mucosal fold was visualized.  The polyp was lifted by  submucosal saline injection.  The polyp was removed with the electrocautery  snare in a piecemeal fashion.  The polypectomy site was tattooed with [place]  ink.   SPLENIC FLEXURE:  Normal.   TRANSVERSE COLON:  Normal.   HEPATIC FLEXURE:  Normal.   ASCENDING COLON:  Normal.   CECUM AND ILEOCECAL VALVE:  Normal.   ASSESSMENT:  A 3 cm sessile polyp at 60-70 cm from the anal verge in the  left colon was lifted by submucosal saline injection.  Removed with the  electrocautery snare in a piecemeal fashion and tattooed with [place] ink.           ______________________________  Danise Edge, M.D.     MJ/MEDQ  D:  08/30/2005  T:  08/30/2005  Job:  [id number]   cc:   Cliffton Asters, M.D.  Fax: [phone]   Thora Lance, M.D.  Fax: [phone]

## 2011-03-15 ENCOUNTER — Other Ambulatory Visit (INDEPENDENT_AMBULATORY_CARE_PROVIDER_SITE_OTHER): Payer: BC Managed Care – PPO

## 2011-03-15 ENCOUNTER — Other Ambulatory Visit: Payer: Self-pay | Admitting: Internal Medicine

## 2011-03-15 DIAGNOSIS — B20 Human immunodeficiency virus [HIV] disease: Secondary | ICD-10-CM

## 2011-03-15 DIAGNOSIS — Z113 Encounter for screening for infections with a predominantly sexual mode of transmission: Secondary | ICD-10-CM

## 2011-03-15 LAB — URINALYSIS, ROUTINE W REFLEX MICROSCOPIC
Bilirubin Urine: NEGATIVE
Hgb urine dipstick: NEGATIVE
Ketones, ur: NEGATIVE mg/dL
Protein, ur: NEGATIVE mg/dL
Urobilinogen, UA: 0.2 mg/dL (ref 0.0–1.0)

## 2011-03-16 LAB — COMPLETE METABOLIC PANEL WITH GFR
ALT: 35 U/L (ref 0–53)
Alkaline Phosphatase: 74 U/L (ref 39–117)
CO2: 28 mEq/L (ref 19–32)
Creat: 1.18 mg/dL (ref 0.50–1.35)
GFR, Est African American: >60 mL/min (ref >60)
Total Bilirubin: 0.9 mg/dL (ref 0.3–1.2)

## 2011-03-16 LAB — CBC WITH DIFFERENTIAL/PLATELET
Eosinophils Absolute: 0 10*3/uL (ref 0.0–0.7)
Hemoglobin: 13.1 g/dL (ref 13.0–17.0)
Lymphs Abs: 1 10*3/uL (ref 0.7–4.0)
MCH: 37.4 pg — ABNORMAL HIGH (ref 26.0–34.0)
Monocytes Relative: 6 % (ref 3–12)
Neutro Abs: 2.6 10*3/uL (ref 1.7–7.7)
Neutrophils Relative %: 67 % (ref 43–77)
RBC: 3.5 MIL/uL — ABNORMAL LOW (ref 4.22–5.81)

## 2011-03-16 LAB — LIPID PANEL
Cholesterol: 120 mg/dL (ref 0–200)
Total CHOL/HDL Ratio: 3.9 Ratio
Triglycerides: 272 mg/dL — ABNORMAL HIGH (ref <150)
VLDL: 54 mg/dL — ABNORMAL HIGH (ref 0–40)

## 2011-03-16 LAB — T-HELPER CELL (CD4) - (RCID CLINIC ONLY): CD4 % Helper T Cell: 27 % — ABNORMAL LOW (ref 33–55)

## 2011-03-16 LAB — URINALYSIS, MICROSCOPIC ONLY
Bacteria, UA: NONE SEEN
Casts: NONE SEEN

## 2011-03-16 LAB — GC/CHLAMYDIA PROBE AMP, URINE
Chlamydia, Swab/Urine, PCR: NEGATIVE
GC Probe Amp, Urine: NEGATIVE

## 2011-03-16 LAB — RPR

## 2011-03-17 ENCOUNTER — Other Ambulatory Visit: Payer: Self-pay | Admitting: *Deleted

## 2011-03-17 DIAGNOSIS — B20 Human immunodeficiency virus [HIV] disease: Secondary | ICD-10-CM

## 2011-03-17 LAB — HIV-1 RNA QUANT-NO REFLEX-BLD: HIV-1 RNA Quant, Log: <1.3 {Log} (ref <1.30)

## 2011-03-17 MED ORDER — STAVUDINE 40 MG PO CAPS: 40.0000 mg | ORAL_CAPSULE | Freq: Two times a day (BID) | ORAL | Status: DC

## 2011-03-29 ENCOUNTER — Ambulatory Visit (INDEPENDENT_AMBULATORY_CARE_PROVIDER_SITE_OTHER): Payer: BC Managed Care – PPO | Admitting: Internal Medicine

## 2011-03-29 ENCOUNTER — Encounter: Payer: Self-pay | Admitting: Internal Medicine

## 2011-03-29 VITALS — BP 124/82 | HR 81 | Temp 97.9°F | Ht 70.0 in | Wt 176.5 lb

## 2011-03-29 DIAGNOSIS — B20 Human immunodeficiency virus [HIV] disease: Secondary | ICD-10-CM

## 2011-03-29 DIAGNOSIS — E785 Hyperlipidemia, unspecified: Secondary | ICD-10-CM

## 2011-03-29 DIAGNOSIS — K219 Gastro-esophageal reflux disease without esophagitis: Secondary | ICD-10-CM

## 2011-03-29 NOTE — Assessment & Plan Note (Signed)
His infection is under good control when he is having slow CD4 reconstitution. I will continue his current regimen.

## 2011-03-29 NOTE — Progress Notes (Signed)
  Subjective:    Patient ID: Brett Sims, male    DOB: 1946-01-29, 65 y.o.   MRN: [id number]  HPI Brett Sims is in for his routine visit. He denies missing any doses of his HIV medications since his last visit. He denies having any new problems or concerns. He has been enjoying retirement. He recently traveled to [place] and visited Brett Sims. He enjoys working in his garden and working out.    Review of Systems     Objective:   Physical Exam  Constitutional: No distress.       His peripheral lipoatrophy and central adiposity is more noticeable.  HENT:  Mouth/Throat: Oropharynx is clear and moist. No oropharyngeal exudate.  Cardiovascular: Normal rate, regular rhythm and normal heart sounds.   No murmur heard. Pulmonary/Chest: Breath sounds normal. He has no wheezes. He has no rales.  Psychiatric: He has a normal mood and affect.          Assessment & Plan:

## 2011-03-29 NOTE — Assessment & Plan Note (Signed)
His triglycerides are improving since the institution of fish oil.

## 2011-03-29 NOTE — Assessment & Plan Note (Signed)
His reflux is well controlled with AcipHex and his morning nausea has improved significantly since he retired.

## 2011-04-25 ENCOUNTER — Other Ambulatory Visit: Payer: Self-pay | Admitting: *Deleted

## 2011-04-25 DIAGNOSIS — G4700 Insomnia, unspecified: Secondary | ICD-10-CM

## 2011-04-25 MED ORDER — PROCHLORPERAZINE MALEATE 5 MG PO TABS: 5.0000 mg | ORAL_TABLET | Freq: Every evening | ORAL | Status: DC | PRN

## 2011-07-14 LAB — T-HELPER CELL (CD4) - (RCID CLINIC ONLY): CD4 % Helper T Cell: 24 % — ABNORMAL LOW (ref 33–55)

## 2011-07-15 ENCOUNTER — Other Ambulatory Visit: Payer: Self-pay | Admitting: Internal Medicine

## 2011-07-22 LAB — T-HELPER CELL (CD4) - (RCID CLINIC ONLY): CD4 T Cell Abs: 200 — ABNORMAL LOW

## 2011-08-17 ENCOUNTER — Other Ambulatory Visit: Payer: Self-pay | Admitting: Internal Medicine

## 2011-08-17 DIAGNOSIS — R110 Nausea: Secondary | ICD-10-CM

## 2011-09-19 ENCOUNTER — Other Ambulatory Visit: Payer: Self-pay | Admitting: Internal Medicine

## 2011-09-19 DIAGNOSIS — B20 Human immunodeficiency virus [HIV] disease: Secondary | ICD-10-CM

## 2011-10-21 ENCOUNTER — Other Ambulatory Visit: Payer: Self-pay | Admitting: *Deleted

## 2011-10-21 DIAGNOSIS — B20 Human immunodeficiency virus [HIV] disease: Secondary | ICD-10-CM

## 2011-10-21 MED ORDER — STAVUDINE 40 MG PO CAPS: 40.0000 mg | ORAL_CAPSULE | Freq: Two times a day (BID) | ORAL | Status: DC

## 2011-10-21 NOTE — Telephone Encounter (Signed)
Pt states his pharmacy told him he was no longer a pt with Dr. Orvan Falconer. He needed a refill. I refilled it & told him I would call the pharmacy as well. I did call them & gave the refill. The pharmacist does not know who told him that

## 2011-11-02 ENCOUNTER — Other Ambulatory Visit (INDEPENDENT_AMBULATORY_CARE_PROVIDER_SITE_OTHER): Payer: BC Managed Care – PPO

## 2011-11-02 DIAGNOSIS — B20 Human immunodeficiency virus [HIV] disease: Secondary | ICD-10-CM

## 2011-11-02 DIAGNOSIS — Z113 Encounter for screening for infections with a predominantly sexual mode of transmission: Secondary | ICD-10-CM

## 2011-11-02 DIAGNOSIS — Z79899 Other long term (current) drug therapy: Secondary | ICD-10-CM

## 2011-11-02 LAB — CBC
HCT: 38.9 % — ABNORMAL LOW (ref 39.0–52.0)
RDW: 14.8 % (ref 11.5–15.5)
WBC: 3.9 10*3/uL — ABNORMAL LOW (ref 4.0–10.5)

## 2011-11-02 LAB — RPR

## 2011-11-02 LAB — COMPREHENSIVE METABOLIC PANEL
ALT: 42 U/L (ref 0–53)
AST: 31 U/L (ref 0–37)
Albumin: 4.8 g/dL (ref 3.5–5.2)
BUN: 13 mg/dL (ref 6–23)
Calcium: 9.2 mg/dL (ref 8.4–10.5)
Chloride: 102 mEq/L (ref 96–112)
Potassium: 4.4 mEq/L (ref 3.5–5.3)
Total Protein: 6.7 g/dL (ref 6.0–8.3)

## 2011-11-02 LAB — LIPID PANEL: HDL: 34 mg/dL — ABNORMAL LOW (ref >39)

## 2011-11-03 LAB — T-HELPER CELL (CD4) - (RCID CLINIC ONLY)
CD4 % Helper T Cell: 27 % — ABNORMAL LOW (ref 33–55)
CD4 T Cell Abs: 210 uL — ABNORMAL LOW (ref 400–2700)

## 2011-11-04 LAB — HIV-1 RNA QUANT-NO REFLEX-BLD: HIV-1 RNA Quant, Log: <1.3 {Log} (ref <1.30)

## 2011-11-16 ENCOUNTER — Encounter: Payer: Self-pay | Admitting: Internal Medicine

## 2011-11-16 ENCOUNTER — Ambulatory Visit (INDEPENDENT_AMBULATORY_CARE_PROVIDER_SITE_OTHER): Payer: BC Managed Care – PPO | Admitting: Internal Medicine

## 2011-11-16 ENCOUNTER — Other Ambulatory Visit: Payer: Self-pay | Admitting: *Deleted

## 2011-11-16 VITALS — BP 136/88 | HR 91 | Temp 97.8°F | Ht 70.0 in | Wt 181.5 lb

## 2011-11-16 DIAGNOSIS — B20 Human immunodeficiency virus [HIV] disease: Secondary | ICD-10-CM

## 2011-11-16 MED ORDER — PROMETHAZINE HCL 12.5 MG PO TABS
12.5000 mg | ORAL_TABLET | Freq: Four times a day (QID) | ORAL | Status: DC | PRN
Start: 2011-11-16 — End: 2012-07-16

## 2011-11-16 NOTE — Progress Notes (Signed)
Patient ID: Brett Sims, male   DOB: 1945/10/12, 66 y.o.   MRN: [id number]  INFECTIOUS DISEASE PROGRESS NOTE    Subjective: La is in for his routine visit. He is a little bit concerned about his recent increase in belly fat. However he admits that he did eat more over the holidays and has been exercising a little bit less. He has not missed any doses of his medications. He notes that his acid reflux symptoms are improved since he is trying to cut down on fried foods. He still has problems with some morning nausea for which she takes Phenergan and then daily mild diarrhea for which he takes Imodium. His partner remain sexually active. He always uses a condom but his partner sometimes does not. His partner is still HIV negative.  Objective:   General: He is in good spirits Skin: No rash Lungs: Clear Cor: Regular S1 and S2 no murmurs Abdomen: He does have some central adiposity that is increased.   Lab Results Lab Results  Component Value Date   WBC 3.9* 11/02/2011   HGB 14.3 11/02/2011   HCT 38.9* 11/02/2011   MCV 101.0* 11/02/2011   PLT 104* 11/02/2011    Lab Results  Component Value Date   CREATININE 1.15 11/02/2011   BUN 13 11/02/2011   NA 137 11/02/2011   K 4.4 11/02/2011   CL 102 11/02/2011   CO2 24 11/02/2011    Lab Results  Component Value Date   ALT 42 11/02/2011   AST 31 11/02/2011   ALKPHOS 77 11/02/2011   BILITOT 0.8 11/02/2011    HIV 1 RNA Quant (copies/mL)  Date Value  11/02/2011 <20   03/15/2011 <20   07/13/2010 <20 copies/mL      CD4 T Cell Abs (cmm)  Date Value  11/02/2011 210*  03/15/2011 290*  07/13/2010 310*      Assessment: His HIV remains under excellent control. We'll continue his current regimen.  His triglyceride levels have improved but still remain just below 300. I believe he is taking less than a suboptimal regimen the fish oil. I've asked him to try to increase to about 4 g daily.  I will continue his Phenergan and Imodium for symptom control.  I  had a discussion with him about the potential risks of unprotected receptive intercourse. Although his undetectable viral load greatly decreases transmission it is certainly not likely to be impossible.  Plan: 1. Continue current HIV regimen 2. Increase fish oil supplements to 4 g daily 3. Return to clinic after lab work in 6 months   Cliffton Asters, MD [facility] for Infectious Diseases [facility] [phone] pager   [phone] cell 11/16/2011, 3:00 PM

## 2012-01-12 ENCOUNTER — Telehealth: Payer: Self-pay | Admitting: *Deleted

## 2012-01-12 NOTE — Telephone Encounter (Signed)
States he bought a herbal supplement called Rhodiola. Wanted to make sure it did not interfere with his meds. I asked that he bring the bottle to his pharmacist & ask them to add it to his profile. They can then run it checking for any interactions. It has rhodiola 250mg  & Holy Basil 50mg , milky seed & wild oats.  He started yesterday & geels wonderful. This was recommended by Dr. Neil Crouch, the TV host   fyi to md

## 2012-01-12 NOTE — Telephone Encounter (Signed)
I called & left him a message that md did not recommend this supplement. To call me back for more details

## 2012-01-12 NOTE — Telephone Encounter (Signed)
Please tell me he'll that these types of supplements are largely unregulated and aren't tested meaning that I have no way of knowing if there are significant interactions with his other medications or if the supplements are likely to be safe and effective.  In general, I do not recommend unregulated supplements for these reasons.

## 2012-02-16 ENCOUNTER — Other Ambulatory Visit: Payer: Self-pay | Admitting: Internal Medicine

## 2012-02-20 ENCOUNTER — Other Ambulatory Visit: Payer: Self-pay | Admitting: Internal Medicine

## 2012-02-20 DIAGNOSIS — B20 Human immunodeficiency virus [HIV] disease: Secondary | ICD-10-CM

## 2012-05-24 ENCOUNTER — Other Ambulatory Visit: Payer: BC Managed Care – PPO

## 2012-05-24 DIAGNOSIS — B20 Human immunodeficiency virus [HIV] disease: Secondary | ICD-10-CM

## 2012-05-24 LAB — COMPREHENSIVE METABOLIC PANEL
AST: 41 U/L — ABNORMAL HIGH (ref 0–37)
Albumin: 4.5 g/dL (ref 3.5–5.2)
Alkaline Phosphatase: 71 U/L (ref 39–117)
Potassium: 4.5 mEq/L (ref 3.5–5.3)
Sodium: 141 mEq/L (ref 135–145)
Total Bilirubin: 0.9 mg/dL (ref 0.3–1.2)
Total Protein: 6.5 g/dL (ref 6.0–8.3)

## 2012-05-24 LAB — CBC
MCHC: 36.4 g/dL — ABNORMAL HIGH (ref 30.0–36.0)
Platelets: 101 10*3/uL — ABNORMAL LOW (ref 150–400)
RDW: 14.5 % (ref 11.5–15.5)

## 2012-05-24 LAB — RPR

## 2012-05-24 LAB — LIPID PANEL
LDL Cholesterol: 57 mg/dL (ref 0–99)
VLDL: 39 mg/dL (ref 0–40)

## 2012-05-25 LAB — T-HELPER CELL (CD4) - (RCID CLINIC ONLY): CD4 % Helper T Cell: 28 % — ABNORMAL LOW (ref 33–55)

## 2012-05-25 LAB — HIV-1 RNA QUANT-NO REFLEX-BLD
HIV 1 RNA Quant: <20 copies/mL (ref <20)
HIV-1 RNA Quant, Log: <1.3 {Log} (ref <1.30)

## 2012-06-06 ENCOUNTER — Ambulatory Visit (INDEPENDENT_AMBULATORY_CARE_PROVIDER_SITE_OTHER): Payer: BC Managed Care – PPO | Admitting: Internal Medicine

## 2012-06-06 ENCOUNTER — Encounter: Payer: Self-pay | Admitting: Internal Medicine

## 2012-06-06 VITALS — BP 155/85 | HR 77 | Temp 98.1°F | Ht 68.0 in | Wt 179.8 lb

## 2012-06-06 DIAGNOSIS — B20 Human immunodeficiency virus [HIV] disease: Secondary | ICD-10-CM

## 2012-06-06 NOTE — Progress Notes (Signed)
Patient ID: Brett Sims, male   DOB: 29-Mar-1946, 66 y.o.   MRN: [id number]     [facility] for Infectious Disease  Patient Active Problem List  Diagnosis  . HIV DISEASE  . NEOPLASM, COLON  . HYPERLIPIDEMIA  . GERD  . BENIGN PROSTATIC HYPERTROPHY  . HYPERGLYCEMIA  . PNEUMONIA, HX OF  . POSTPROCEDURAL STATUS NEC    Patient's Medications  New Prescriptions   No medications on file  Previous Medications   LOPERAMIDE (IMODIUM A-D) 2 MG TABLET    Take 2 mg by mouth daily as needed.     MULTIPLE VITAMIN (MULTIVITAMIN) TABLET    Take 1 tablet by mouth daily.   NEVIRAPINE (VIRAMUNE) 200 MG TABLET    TAKE 1 TABLET TWICE DAILY   OMEGA-3 FATTY ACIDS (FISH OIL) 500 MG CAPS    Take 1,200 mg by mouth 3 (three) times daily.    PROMETHAZINE (PHENERGAN) 12.5 MG TABLET    Take 1 tablet (12.5 mg total) by mouth 4 (four) times daily as needed.   RABEPRAZOLE (ACIPHEX) 20 MG TABLET    Take 20 mg by mouth daily.     STAVUDINE (ZERIT) 40 MG CAPSULE    TAKE 1 CASPULE BY MOUTH TWICE DAILY   TRUVADA 200-300 MG PER TABLET    TAKE 1 TABLET EVERY DAY  Modified Medications   No medications on file  Discontinued Medications   DOXYCYCLINE (VIBRAMYCIN) 100 MG CAPSULE    Take 100 mg by mouth every other day. May take up to BID    Subjective: Brett Sims is in for his routine visit. As usual he never misses a single dose of his HIV medications. He still has occasional morning nausea and occasional acid reflux but overall these have improved.  Objective: Temp: 98.1 F (36.7 C) (08/28 1427) Temp src: Oral (08/28 1427) BP: 155/85 mmHg (08/28 1427) Pulse Rate: 77  (08/28 1427)  General: he is in good spirits his usual Skin: no rash. Lungs: clear Cor: regular S1 and S2 and no murmur  Lab Results HIV 1 RNA Quant (copies/mL)  Date Value  05/24/2012 <20   11/02/2011 <20   03/15/2011 <20      CD4 T Cell Abs (cmm)  Date Value  05/24/2012 300*  11/02/2011 210*  03/15/2011 290*     Assessment: His HIV  infection remains under excellent control. I will continue his current antiretroviral regimen and see him back after blood work in 6 months.  Plan: 1. Continue current antiretroviral regimen 2. Follow up after blood work in 6 months   Cliffton Asters, MD [facility] for Infectious Disease [facility] [phone] pager   [phone] cell 06/06/2012, 3:11 PM

## 2012-07-07 ENCOUNTER — Other Ambulatory Visit: Payer: Self-pay | Admitting: Internal Medicine

## 2012-07-16 ENCOUNTER — Other Ambulatory Visit: Payer: Self-pay | Admitting: Internal Medicine

## 2012-07-16 DIAGNOSIS — R110 Nausea: Secondary | ICD-10-CM

## 2012-08-10 ENCOUNTER — Other Ambulatory Visit: Payer: Self-pay | Admitting: Internal Medicine

## 2012-11-14 ENCOUNTER — Other Ambulatory Visit: Payer: Self-pay | Admitting: Internal Medicine

## 2012-11-14 DIAGNOSIS — B20 Human immunodeficiency virus [HIV] disease: Secondary | ICD-10-CM

## 2012-11-26 ENCOUNTER — Other Ambulatory Visit (INDEPENDENT_AMBULATORY_CARE_PROVIDER_SITE_OTHER): Payer: BC Managed Care – PPO

## 2012-11-26 DIAGNOSIS — B20 Human immunodeficiency virus [HIV] disease: Secondary | ICD-10-CM

## 2012-11-27 LAB — COMPREHENSIVE METABOLIC PANEL
Alkaline Phosphatase: 73 U/L (ref 39–117)
CO2: 26 mEq/L (ref 19–32)
Creat: 1.08 mg/dL (ref 0.50–1.35)
Glucose, Bld: 102 mg/dL — ABNORMAL HIGH (ref 70–99)
Sodium: 141 mEq/L (ref 135–145)
Total Bilirubin: 0.7 mg/dL (ref 0.3–1.2)
Total Protein: 6.6 g/dL (ref 6.0–8.3)

## 2012-11-27 LAB — CBC
Hemoglobin: 13.6 g/dL (ref 13.0–17.0)
MCH: 37.6 pg — ABNORMAL HIGH (ref 26.0–34.0)
MCHC: 35.9 g/dL (ref 30.0–36.0)
MCV: 104.7 fL — ABNORMAL HIGH (ref 78.0–100.0)
Platelets: 112 10*3/uL — ABNORMAL LOW (ref 150–400)

## 2012-11-27 LAB — LIPID PANEL
Cholesterol: 129 mg/dL (ref 0–200)
HDL: 28 mg/dL — ABNORMAL LOW (ref >39)
LDL Cholesterol: 27 mg/dL (ref 0–99)
Total CHOL/HDL Ratio: 4.6 Ratio
Triglycerides: 371 mg/dL — ABNORMAL HIGH (ref <150)
VLDL: 74 mg/dL — ABNORMAL HIGH (ref 0–40)

## 2012-11-27 LAB — HIV-1 RNA QUANT-NO REFLEX-BLD: HIV 1 RNA Quant: <20 copies/mL (ref <20)

## 2012-11-27 LAB — T-HELPER CELL (CD4) - (RCID CLINIC ONLY): CD4 % Helper T Cell: 31 % — ABNORMAL LOW (ref 33–55)

## 2012-12-09 ENCOUNTER — Other Ambulatory Visit: Payer: Self-pay | Admitting: Internal Medicine

## 2012-12-10 ENCOUNTER — Encounter: Payer: Self-pay | Admitting: Internal Medicine

## 2012-12-10 ENCOUNTER — Ambulatory Visit (INDEPENDENT_AMBULATORY_CARE_PROVIDER_SITE_OTHER): Payer: BC Managed Care – PPO | Admitting: Internal Medicine

## 2012-12-10 VITALS — BP 169/82 | HR 81 | Temp 97.8°F | Ht 70.0 in | Wt 184.0 lb

## 2012-12-10 DIAGNOSIS — E785 Hyperlipidemia, unspecified: Secondary | ICD-10-CM

## 2012-12-10 DIAGNOSIS — B20 Human immunodeficiency virus [HIV] disease: Secondary | ICD-10-CM

## 2012-12-10 NOTE — Progress Notes (Addendum)
Patient ID: Brett Sims, male   DOB: 1946/06/26, 67 y.o.   MRN: [id number]          [facility] for Infectious Disease  Patient Active Problem List  Diagnosis  . HIV DISEASE  . NEOPLASM, COLON  . HYPERLIPIDEMIA  . GERD  . BENIGN PROSTATIC HYPERTROPHY  . HYPERGLYCEMIA  . PNEUMONIA, HX OF  . POSTPROCEDURAL STATUS NEC    Patient's Medications  New Prescriptions   No medications on file  Previous Medications   LOPERAMIDE (IMODIUM A-D) 2 MG TABLET    Take 2 mg by mouth daily as needed.     MULTIPLE VITAMIN (MULTIVITAMIN) TABLET    Take 1 tablet by mouth daily.   NEVIRAPINE (VIRAMUNE) 200 MG TABLET    TAKE 1 TABLET TWICE DAILY   OMEGA-3 FATTY ACIDS (FISH OIL) 500 MG CAPS    Take 1,200 mg by mouth 3 (three) times daily.    PROMETHAZINE (PHENERGAN) 12.5 MG TABLET    TAKE 1 TABLET BY MOUTH 4 TIMES A DAY AS NEEDED   RABEPRAZOLE (ACIPHEX) 20 MG TABLET    Take 20 mg by mouth daily.     STAVUDINE (ZERIT) 40 MG CAPSULE    TAKE 1 CASPULE BY MOUTH TWICE DAILY   TRUVADA 200-300 MG PER TABLET    TAKE 1 TABLET EVERY DAY  Modified Medications   No medications on file  Discontinued Medications   No medications on file    Subjective: Brett Sims is in for his routine visit. He has not missed a single dose of his Truvada, Zerit, or Viramune since his last visit. He continues to take 1 Phenergan and one Imodium daily to help with his nausea and diarrhea. He also remains on generic AcipHex for acid reflux. He feels like his nausea, diarrhea and acid reflux are all related to some degree to his HIV medications. He had left cataract surgery recently and plans on the right eye cataract surgery in 2 days.  Objective: Temp: 97.8 F (36.6 C) (03/03 1443) Temp src: Oral (03/03 1443) BP: 169/82 mmHg (03/03 1443) Pulse Rate: 81 (03/03 1443)  General: is in good spirits as usual Skin: no rash Lungs: clear Cor: regular S1 and S2 no murmurs Abdomen: soft and nontender  Lab Results HIV 1 RNA Quant  (copies/mL)  Date Value  11/26/2012 <20   05/24/2012 <20   11/02/2011 <20      CD4 T Cell Abs (cmm)  Date Value  11/26/2012 350*  05/24/2012 300*  11/02/2011 210*     Lab Results  Component Value Date   CHOL 129 11/26/2012   HDL 28* 11/26/2012   LDLCALC 27 11/26/2012   TRIG 371* 11/26/2012   CHOLHDL 4.6 11/26/2012   Assessment: Brett Sims ended up on his current 4 drug regimen because of intolerance to Combivir and a transient bump in his viral load at decade ago that caused me to intensify his regimen. In 2005 I ordered a genotype resistance test when his viral load bumped up but it could not be done because he was back to undetectable on the day the test was ordered. He has no documented resistance.I will review all of his past records and consider a simplified regimen that hopefully will have less impact on his lipids and fewer side effects.  Plan: 1. Consider simplified salvage regimen   Cliffton Asters, MD [facility] for Infectious Disease [facility] [phone] pager   [phone] cell 12/10/2012, 3:09 PM  Addendum: I will have will  continue Truvada and substitute Tivicay for Zerit and Viramune. This will simplify his regimen, give him one less co-pay and hopefully decrease his side effects including nausea, diarrhea, acid indigestion as well as improving his lipid profile. He will come back in 6-8 weeks.  Cliffton Asters, MD [facility] for Infectious Disease [facility] [phone] pager   [phone] cell 12/11/2012, 1:35 PM

## 2012-12-11 MED ORDER — DOLUTEGRAVIR SODIUM 50 MG PO TABS: 50.0000 mg | ORAL_TABLET | Freq: Every day | ORAL | Status: DC

## 2012-12-11 NOTE — Addendum Note (Signed)
Addended by: Cliffton Asters on: 12/11/2012 01:35 PM   Modules accepted: Orders, Medications

## 2013-01-24 ENCOUNTER — Encounter: Payer: Self-pay | Admitting: Internal Medicine

## 2013-01-24 ENCOUNTER — Ambulatory Visit (INDEPENDENT_AMBULATORY_CARE_PROVIDER_SITE_OTHER): Payer: BC Managed Care – PPO | Admitting: Internal Medicine

## 2013-01-24 VITALS — BP 136/75 | HR 88 | Temp 98.4°F | Ht 70.0 in | Wt 187.0 lb

## 2013-01-24 DIAGNOSIS — B20 Human immunodeficiency virus [HIV] disease: Secondary | ICD-10-CM

## 2013-01-24 NOTE — Progress Notes (Signed)
Patient ID: Brett Sims, male   DOB: 11-30-1945, 67 y.o.   MRN: [id number]          [facility] for Infectious Disease  Patient Active Problem List  Diagnosis  . HIV DISEASE  . NEOPLASM, COLON  . HYPERLIPIDEMIA  . GERD  . BENIGN PROSTATIC HYPERTROPHY  . HYPERGLYCEMIA  . PNEUMONIA, HX OF  . POSTPROCEDURAL STATUS NEC    Patient's Medications  New Prescriptions   No medications on file  Previous Medications   DOLUTEGRAVIR SODIUM (TIVICAY) 50 MG TABS    Take 1 tablet (50 mg total) by mouth daily.   LOPERAMIDE (IMODIUM A-D) 2 MG TABLET    Take 2 mg by mouth daily as needed.     MULTIPLE VITAMIN (MULTIVITAMIN) TABLET    Take 1 tablet by mouth daily.   OMEGA-3 FATTY ACIDS (FISH OIL) 500 MG CAPS    Take 1,200 mg by mouth 3 (three) times daily.    PROMETHAZINE (PHENERGAN) 12.5 MG TABLET    TAKE 1 TABLET BY MOUTH 4 TIMES A DAY AS NEEDED   RABEPRAZOLE (ACIPHEX) 20 MG TABLET    Take 20 mg by mouth daily.     TRUVADA 200-300 MG PER TABLET    TAKE 1 TABLET EVERY DAY  Modified Medications   No medications on file  Discontinued Medications   No medications on file    Subjective: Will is in for his routine visit. He is feeling remarkably better since he switched to his new salvage regimen. He has not had any nausea since the switch last month and his diarrhea is better. He also believes that his acid reflux has improved. He has noted very mild intermittent headaches and wonders if they could be related to his Tivicay. However he says they are very minor and he enjoys the new regimen much better than his old regimen.  Objective: Temp: 98.4 F (36.9 C) (04/17 0924) Temp src: Oral (04/17 0924) BP: 136/75 mmHg (04/17 0924) Pulse Rate: 88 (04/17 0924)  General: He is in very good spirits Lungs: Clear Cor: Regular S1 and S2 no murmurs Abdomen: No epigastric tenderness  Lab Results HIV 1 RNA Quant (copies/mL)  Date Value  11/26/2012 <20   05/24/2012 <20   11/02/2011 <20      CD4  T Cell Abs (cmm)  Date Value  11/26/2012 350*  05/24/2012 300*  11/02/2011 210*     Assessment: He is doing much better on his simpler salvage regimen.  Plan: 1. Continue his current medications 2. Return after lab work in 3 months 3. He may consider a trial off of proton pump inhibitor if he continues to improve   Cliffton Asters, MD [facility] for Infectious Disease [facility] [phone] pager   [phone] cell 01/24/2013, 9:40 AM

## 2013-04-23 ENCOUNTER — Other Ambulatory Visit: Payer: Medicare Other

## 2013-04-23 DIAGNOSIS — B20 Human immunodeficiency virus [HIV] disease: Secondary | ICD-10-CM

## 2013-04-24 LAB — HIV-1 RNA QUANT-NO REFLEX-BLD
HIV 1 RNA Quant: <20 copies/mL (ref <20)
HIV-1 RNA Quant, Log: <1.3 {Log} (ref <1.30)

## 2013-04-24 LAB — T-HELPER CELL (CD4) - (RCID CLINIC ONLY): CD4 T Cell Abs: 320 uL — ABNORMAL LOW (ref 400–2700)

## 2013-05-07 ENCOUNTER — Ambulatory Visit (INDEPENDENT_AMBULATORY_CARE_PROVIDER_SITE_OTHER): Payer: Medicare Other | Admitting: Internal Medicine

## 2013-05-07 ENCOUNTER — Encounter: Payer: Self-pay | Admitting: Internal Medicine

## 2013-05-07 VITALS — BP 143/82 | HR 83 | Temp 97.6°F | Ht 70.0 in | Wt 189.5 lb

## 2013-05-07 DIAGNOSIS — B20 Human immunodeficiency virus [HIV] disease: Secondary | ICD-10-CM

## 2013-05-07 NOTE — Progress Notes (Signed)
Patient ID: Brett Sims, male   DOB: Apr 25, 1946, 67 y.o.   MRN: [id number]          [facility] for Infectious Disease  Patient Active Problem List   Diagnosis Date Noted  . HYPERGLYCEMIA 04/09/2009  . HYPERLIPIDEMIA 12/31/2007  . HIV DISEASE 06/12/2007  . GERD 06/12/2007  . BENIGN PROSTATIC HYPERTROPHY 06/12/2007  . PNEUMONIA, HX OF 06/12/2007  . POSTPROCEDURAL STATUS NEC 06/12/2007  . NEOPLASM, COLON 06/13/2006    Patient's Medications  New Prescriptions   No medications on file  Previous Medications   DOLUTEGRAVIR SODIUM (TIVICAY) 50 MG TABS    Take 1 tablet (50 mg total) by mouth daily.   LOPERAMIDE (IMODIUM A-D) 2 MG TABLET    Take 2 mg by mouth daily as needed.     MULTIPLE VITAMIN (MULTIVITAMIN) TABLET    Take 1 tablet by mouth daily.   OMEGA-3 FATTY ACIDS (FISH OIL) 500 MG CAPS    Take 1,200 mg by mouth 3 (three) times daily.    PROMETHAZINE (PHENERGAN) 12.5 MG TABLET    TAKE 1 TABLET BY MOUTH 4 TIMES A DAY AS NEEDED   RABEPRAZOLE (ACIPHEX) 20 MG TABLET    Take 20 mg by mouth daily.     TRUVADA 200-300 MG PER TABLET    TAKE 1 TABLET EVERY DAY  Modified Medications   No medications on file  Discontinued Medications   No medications on file    Subjective: Brett Sims is in for his routine visit. He feels significantly better since switching to Truvada and Tivicay. He has noted some mild intermittent headaches and a few red spots on his skin that do not itch and have not progressed but otherwise he is tolerating it well. He's had no more of his intermittent muscle spasms, sneezing fits or acid indigestion that he noticed on his previous regimen. He is still taking his proton pump inhibitor but believes he could probably try doing without it. He is working out 3 times a week at [employer] and stays very active working in his garden. Review of Systems: Pertinent items are noted in HPI.  No past medical history on file.  History  Substance Use Topics  . Smoking status:  Never Smoker   . Smokeless tobacco: Never Used  . Alcohol Use: No    No family history on file.  Allergies  Allergen Reactions  . Codeine   . Erythromycin   . Sulfonamide Derivatives     Objective: Temp: 97.6 F (36.4 C) (07/29 0858) Temp src: Oral (07/29 0858) BP: 143/82 mmHg (07/29 0858) Pulse Rate: 83 (07/29 0858)  General: His weight is up slightly Oral: No oropharyngeal lesions Skin: Red macular rash on inner arms and a few spots on legs Lungs: Clear Cor: Regular S1 and S2 no murmurs Abdomen: Central adiposity unchanged Joints and extremities: No acute abnormalities Neuro: Normal speech and conversation Mood and affect: Normal  Lab Results HIV 1 RNA Quant (copies/mL)  Date Value  04/23/2013 <20   11/26/2012 <20   05/24/2012 <20      CD4 T Cell Abs (cmm)  Date Value  04/23/2013 320*  11/26/2012 350*  05/24/2012 300*     Assessment: He continues to have excellent virologic response to his new HIV regimen and overall is tolerating it better. I asked him to call me if his rash progresses or starts to bother him.  Plan: 1. Continue Truvada and Tivicay 2. Followup after lab work in 6 months   Brett Sims  Orvan Falconer, MD [facility] for Infectious Disease [facility] [phone] pager   [phone] cell 05/07/2013, 9:23 AM

## 2013-07-04 ENCOUNTER — Other Ambulatory Visit: Payer: Self-pay | Admitting: Internal Medicine

## 2013-07-24 ENCOUNTER — Ambulatory Visit: Payer: Medicare Other

## 2013-07-24 DIAGNOSIS — Z23 Encounter for immunization: Secondary | ICD-10-CM

## 2013-11-06 ENCOUNTER — Other Ambulatory Visit: Payer: Medicare PPO

## 2013-11-06 DIAGNOSIS — B20 Human immunodeficiency virus [HIV] disease: Secondary | ICD-10-CM

## 2013-11-06 DIAGNOSIS — Z113 Encounter for screening for infections with a predominantly sexual mode of transmission: Secondary | ICD-10-CM

## 2013-11-06 DIAGNOSIS — Z79899 Other long term (current) drug therapy: Secondary | ICD-10-CM

## 2013-11-07 ENCOUNTER — Encounter: Payer: Self-pay | Admitting: Internal Medicine

## 2013-11-07 LAB — HIV-1 RNA QUANT-NO REFLEX-BLD
HIV 1 RNA Quant: <20 copies/mL (ref <20)
HIV-1 RNA Quant, Log: <1.3 {Log} (ref <1.30)

## 2013-11-07 LAB — LIPID PANEL
CHOL/HDL RATIO: 4.3 ratio
CHOLESTEROL: 104 mg/dL (ref 0–200)
HDL: 24 mg/dL — AB (ref >39)
LDL Cholesterol: 5 mg/dL (ref 0–99)
Triglycerides: 373 mg/dL — ABNORMAL HIGH (ref <150)
VLDL: 75 mg/dL — ABNORMAL HIGH (ref 0–40)

## 2013-11-07 LAB — T-HELPER CELL (CD4) - (RCID CLINIC ONLY)
CD4 T CELL ABS: 330 /uL — AB (ref 400–2700)
CD4 T CELL HELPER: 32 % — AB (ref 33–55)

## 2013-11-07 LAB — COMPREHENSIVE METABOLIC PANEL
ALT: 54 U/L — ABNORMAL HIGH (ref 0–53)
AST: 36 U/L (ref 0–37)
Albumin: 4.2 g/dL (ref 3.5–5.2)
Alkaline Phosphatase: 55 U/L (ref 39–117)
BILIRUBIN TOTAL: 1.3 mg/dL — AB (ref 0.2–1.2)
BUN: 17 mg/dL (ref 6–23)
CO2: 27 meq/L (ref 19–32)
CREATININE: 1.21 mg/dL (ref 0.50–1.35)
Calcium: 8.8 mg/dL (ref 8.4–10.5)
Chloride: 105 mEq/L (ref 96–112)
GLUCOSE: 121 mg/dL — AB (ref 70–99)
Potassium: 3.8 mEq/L (ref 3.5–5.3)
Sodium: 142 mEq/L (ref 135–145)
Total Protein: 6.4 g/dL (ref 6.0–8.3)

## 2013-11-07 LAB — RPR

## 2013-11-12 ENCOUNTER — Encounter: Payer: Self-pay | Admitting: Internal Medicine

## 2013-11-12 ENCOUNTER — Ambulatory Visit (INDEPENDENT_AMBULATORY_CARE_PROVIDER_SITE_OTHER): Payer: Medicare PPO | Admitting: Internal Medicine

## 2013-11-12 VITALS — BP 149/89 | HR 78 | Temp 97.9°F | Ht 70.0 in | Wt 193.0 lb

## 2013-11-12 DIAGNOSIS — B20 Human immunodeficiency virus [HIV] disease: Secondary | ICD-10-CM

## 2013-11-12 DIAGNOSIS — Z23 Encounter for immunization: Secondary | ICD-10-CM

## 2013-11-12 MED ORDER — ELVITEG-COBIC-EMTRICIT-TENOFDF 150-150-200-300 MG PO TABS: 1.0000 | ORAL_TABLET | Freq: Every day | ORAL | Status: DC

## 2013-11-12 NOTE — Addendum Note (Signed)
Addended by: Landis Gandy on: 11/12/2013 11:19 AM   Modules accepted: Orders

## 2013-11-12 NOTE — Progress Notes (Signed)
Patient ID: Brett Sims, male   DOB: 12-Feb-1946, 68 y.o.   MRN: [id number]          [facility] for Infectious Disease  Patient Active Problem List   Diagnosis Date Noted  . HIV DISEASE 06/12/2007    Priority: High  . HYPERGLYCEMIA 04/09/2009    Priority: Medium  . HYPERLIPIDEMIA 12/31/2007    Priority: Medium  . GERD 06/12/2007  . BENIGN PROSTATIC HYPERTROPHY 06/12/2007  . PNEUMONIA, HX OF 06/12/2007  . 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  Previous Medications   CLINDAMYCIN PHOSPHATE FOAM       DOXAZOSIN (CARDURA) 4 MG TABLET       LOPERAMIDE (IMODIUM A-D) 2 MG TABLET    Take 2 mg by mouth daily as needed.     MULTIPLE VITAMIN (MULTIVITAMIN) TABLET    Take 1 tablet by mouth daily.   OMEGA-3 FATTY ACIDS (FISH OIL) 500 MG CAPS    Take 1,200 mg by mouth 3 (three) times daily.    RABEPRAZOLE (ACIPHEX) 20 MG TABLET    Take 20 mg by mouth daily.    Modified Medications   No medications on file  Discontinued Medications   DOLUTEGRAVIR SODIUM (TIVICAY) 50 MG TABS    Take 1 tablet (50 mg total) by mouth daily.   PROMETHAZINE (PHENERGAN) 12.5 MG TABLET    TAKE 1 TABLET BY MOUTH 4 TIMES A DAY AS NEEDED   TRUVADA 200-300 MG PER TABLET    TAKE 1 TABLET EVERY DAY    Subjective: Brett Sims is in for his routine visit. He has not missed any doses of his Truvada or Tivicay since his last visit. He has less problems with acid reflux on this regimen. He is concerned about his increasing belly fat. He goes to the gym and works out 4 times weekly. Review of Systems: A comprehensive review of systems was negative.  No past medical history on file.  History  Substance Use Topics  . Smoking status: Never Smoker   . Smokeless tobacco: Never Used  . Alcohol Use: No    No family history on  file.  Allergies  Allergen Reactions  . Codeine   . Erythromycin   . Sulfonamide Derivatives     Objective: Temp: 97.9 F (36.6 C) (02/03 0928) Temp src: Oral (02/03 0928) BP: 149/89 mmHg (02/03 0928) Pulse Rate: 78 (02/03 0928)  Body mass index is 27.69 kg/(m^2).  General: He is in no distress Oral: No oropharyngeal lesions Skin: No rash Lungs: Clear Cor: Regular S1-S2 no murmurs Abdomen: Obese, soft and nontender Joints and extremities: No acute abnormalities Neuro: Alert with normal speech and conversation Mood and affect: Normal  Lab Results Lab Results  Component Value Date   WBC 4.2 11/26/2012   HGB 13.6 11/26/2012   HCT 37.9* 11/26/2012   MCV 104.7* 11/26/2012   PLT 112* 11/26/2012    Lab Results  Component Value Date   CREATININE 1.21 11/06/2013   BUN 17 11/06/2013   NA 142 11/06/2013   K 3.8 11/06/2013   CL 105 11/06/2013   CO2 27 11/06/2013    Lab Results  Component Value Date   ALT 54* 11/06/2013   AST 36 11/06/2013   ALKPHOS 55 11/06/2013   BILITOT 1.3* 11/06/2013    Lab Results  Component Value Date   CHOL 104  11/06/2013   HDL 24* 11/06/2013   LDLCALC 5 11/06/2013   TRIG 373* 11/06/2013   CHOLHDL 4.3 11/06/2013    Lab Results HIV 1 RNA Quant (copies/mL)  Date Value  11/06/2013 <20   04/23/2013 <20   11/26/2012 <20      CD4 T Cell Abs (/uL)  Date Value  11/06/2013 330*  04/23/2013 320*  11/26/2012 350*     Assessment: His HIV infection is under very good control on his current regimen. He is a candidate for simplification to once daily Stribild.  His blood pressure, blood sugar and triglycerides are not at goal. He will discuss this with his primary care physician, Dr. [name].  Plan: 1. Consolidate antiretroviral therapy to once daily Stribild 2. Followup after lab work in [place] months   Michel Bickers, MD [facility] [place] [phone] pager   [phone] cell 11/12/2013, 9:53 AM

## 2014-04-23 ENCOUNTER — Encounter (HOSPITAL_COMMUNITY): Payer: Self-pay | Admitting: Emergency Medicine

## 2014-04-23 DIAGNOSIS — R109 Unspecified abdominal pain: Secondary | ICD-10-CM | POA: Diagnosis present

## 2014-04-23 DIAGNOSIS — N400 Enlarged prostate without lower urinary tract symptoms: Secondary | ICD-10-CM | POA: Diagnosis not present

## 2014-04-23 DIAGNOSIS — N289 Disorder of kidney and ureter, unspecified: Secondary | ICD-10-CM | POA: Diagnosis not present

## 2014-04-23 DIAGNOSIS — K219 Gastro-esophageal reflux disease without esophagitis: Secondary | ICD-10-CM | POA: Insufficient documentation

## 2014-04-23 DIAGNOSIS — K8000 Calculus of gallbladder with acute cholecystitis without obstruction: Principal | ICD-10-CM | POA: Insufficient documentation

## 2014-04-23 DIAGNOSIS — Z21 Asymptomatic human immunodeficiency virus [HIV] infection status: Secondary | ICD-10-CM | POA: Insufficient documentation

## 2014-04-23 LAB — URINALYSIS, ROUTINE W REFLEX MICROSCOPIC
BILIRUBIN URINE: NEGATIVE
GLUCOSE, UA: NEGATIVE mg/dL
Hgb urine dipstick: NEGATIVE
Ketones, ur: NEGATIVE mg/dL
LEUKOCYTES UA: NEGATIVE
NITRITE: NEGATIVE
PH: 6.5 (ref 5.0–8.0)
Protein, ur: 30 mg/dL — AB
SPECIFIC GRAVITY, URINE: 1.019 (ref 1.005–1.030)
Urobilinogen, UA: 0.2 mg/dL (ref 0.0–1.0)

## 2014-04-23 LAB — COMPREHENSIVE METABOLIC PANEL
ALBUMIN: 4.1 g/dL (ref 3.5–5.2)
ALK PHOS: 75 U/L (ref 39–117)
ALT: 52 U/L (ref 0–53)
ANION GAP: 14 (ref 5–15)
AST: 36 U/L (ref 0–37)
BUN: 19 mg/dL (ref 6–23)
CHLORIDE: 100 meq/L (ref 96–112)
CO2: 26 mEq/L (ref 19–32)
Calcium: 9.4 mg/dL (ref 8.4–10.5)
Creatinine, Ser: 1.52 mg/dL — ABNORMAL HIGH (ref 0.50–1.35)
GFR calc non Af Amer: 46 mL/min — ABNORMAL LOW (ref >90)
GFR, EST AFRICAN AMERICAN: 53 mL/min — AB (ref >90)
GLUCOSE: 159 mg/dL — AB (ref 70–99)
Potassium: 4.4 mEq/L (ref 3.7–5.3)
Sodium: 140 mEq/L (ref 137–147)
Total Bilirubin: 1 mg/dL (ref 0.3–1.2)
Total Protein: 7.5 g/dL (ref 6.0–8.3)

## 2014-04-23 LAB — CBC WITH DIFFERENTIAL/PLATELET
Basophils Absolute: 0 10*3/uL (ref 0.0–0.1)
Basophils Relative: 0 % (ref 0–1)
Eosinophils Absolute: 0.1 10*3/uL (ref 0.0–0.7)
Eosinophils Relative: 1 % (ref 0–5)
HCT: 38.5 % — ABNORMAL LOW (ref 39.0–52.0)
HEMOGLOBIN: 13.9 g/dL (ref 13.0–17.0)
LYMPHS ABS: 1.1 10*3/uL (ref 0.7–4.0)
Lymphocytes Relative: 17 % (ref 12–46)
MCH: 31.5 pg (ref 26.0–34.0)
MCHC: 36.1 g/dL — ABNORMAL HIGH (ref 30.0–36.0)
MCV: 87.3 fL (ref 78.0–100.0)
MONOS PCT: 7 % (ref 3–12)
Monocytes Absolute: 0.5 10*3/uL (ref 0.1–1.0)
NEUTROS ABS: 4.9 10*3/uL (ref 1.7–7.7)
Neutrophils Relative %: 75 % (ref 43–77)
Platelets: 121 10*3/uL — ABNORMAL LOW (ref 150–400)
RBC: 4.41 MIL/uL (ref 4.22–5.81)
RDW: 14.1 % (ref 11.5–15.5)
WBC: 6.6 10*3/uL (ref 4.0–10.5)

## 2014-04-23 LAB — LIPASE, BLOOD: LIPASE: 51 U/L (ref 11–59)

## 2014-04-23 LAB — URINE MICROSCOPIC-ADD ON

## 2014-04-23 NOTE — ED Notes (Signed)
Pt. reports mid abdominal pain onset this evening after eating supper " it burns " , history of GERD , denies nausea or vomitting , no diarrhea , no fever or chills.

## 2014-04-24 ENCOUNTER — Encounter (HOSPITAL_COMMUNITY): Payer: Medicare PPO | Admitting: Anesthesiology

## 2014-04-24 ENCOUNTER — Encounter (HOSPITAL_COMMUNITY)
Admission: EM | Disposition: A | Discharge status: Home / Self Care | Payer: Self-pay | Source: Home / Self Care | Attending: Emergency Medicine

## 2014-04-24 ENCOUNTER — Emergency Department (HOSPITAL_COMMUNITY): Payer: Medicare PPO

## 2014-04-24 ENCOUNTER — Observation Stay (HOSPITAL_COMMUNITY)
Admission: EM | Admit: 2014-04-24 | Discharge: 2014-04-25 | Disposition: A | Discharge status: Home / Self Care | Payer: Medicare PPO | Attending: Surgery | Admitting: Surgery

## 2014-04-24 ENCOUNTER — Observation Stay (HOSPITAL_COMMUNITY): Payer: Medicare PPO | Admitting: Anesthesiology

## 2014-04-24 ENCOUNTER — Encounter (HOSPITAL_COMMUNITY): Payer: Self-pay

## 2014-04-24 DIAGNOSIS — R1011 Right upper quadrant pain: Secondary | ICD-10-CM

## 2014-04-24 DIAGNOSIS — Z21 Asymptomatic human immunodeficiency virus [HIV] infection status: Secondary | ICD-10-CM | POA: Diagnosis not present

## 2014-04-24 DIAGNOSIS — K824 Cholesterolosis of gallbladder: Secondary | ICD-10-CM

## 2014-04-24 DIAGNOSIS — K8010 Calculus of gallbladder with chronic cholecystitis without obstruction: Secondary | ICD-10-CM | POA: Diagnosis present

## 2014-04-24 DIAGNOSIS — K219 Gastro-esophageal reflux disease without esophagitis: Secondary | ICD-10-CM | POA: Diagnosis not present

## 2014-04-24 DIAGNOSIS — K811 Chronic cholecystitis: Secondary | ICD-10-CM

## 2014-04-24 DIAGNOSIS — N289 Disorder of kidney and ureter, unspecified: Secondary | ICD-10-CM | POA: Diagnosis not present

## 2014-04-24 DIAGNOSIS — K819 Cholecystitis, unspecified: Secondary | ICD-10-CM

## 2014-04-24 DIAGNOSIS — K8020 Calculus of gallbladder without cholecystitis without obstruction: Secondary | ICD-10-CM

## 2014-04-24 DIAGNOSIS — K8000 Calculus of gallbladder with acute cholecystitis without obstruction: Secondary | ICD-10-CM | POA: Diagnosis not present

## 2014-04-24 HISTORY — DX: Gastro-esophageal reflux disease without esophagitis: K21.9

## 2014-04-24 HISTORY — DX: Nausea with vomiting, unspecified: R11.2

## 2014-04-24 HISTORY — PX: CHOLECYSTECTOMY: SHX55

## 2014-04-24 HISTORY — DX: Other specified postprocedural states: Z98.890

## 2014-04-24 HISTORY — DX: Other complications of anesthesia, initial encounter: T88.59XA

## 2014-04-24 HISTORY — DX: Adverse effect of unspecified anesthetic, initial encounter: T41.45XA

## 2014-04-24 HISTORY — DX: Human immunodeficiency virus (HIV) disease: B20

## 2014-04-24 HISTORY — DX: Calculus of gallbladder without cholecystitis without obstruction: K80.20

## 2014-04-24 LAB — SURGICAL PCR SCREEN
MRSA, PCR: NEGATIVE
STAPHYLOCOCCUS AUREUS: NEGATIVE

## 2014-04-24 SURGERY — LAPAROSCOPIC CHOLECYSTECTOMY WITH INTRAOPERATIVE CHOLANGIOGRAM
Anesthesia: General | Site: Abdomen

## 2014-04-24 MED ORDER — MORPHINE SULFATE 2 MG/ML IJ SOLN: 1.0000 mg | INTRAMUSCULAR | Status: DC | PRN

## 2014-04-24 MED ORDER — LIDOCAINE HCL (CARDIAC) 20 MG/ML IV SOLN
INTRAVENOUS | Status: AC
Start: 2014-04-24 — End: 2014-04-24
  Filled 2014-04-24: qty 5

## 2014-04-24 MED ORDER — ROCURONIUM BROMIDE 50 MG/5ML IV SOLN
INTRAVENOUS | Status: AC
  Filled 2014-04-24: qty 1

## 2014-04-24 MED ORDER — MIDAZOLAM HCL 5 MG/5ML IJ SOLN
INTRAMUSCULAR | Status: DC | PRN
  Administered 2014-04-24: 2 mg via INTRAVENOUS

## 2014-04-24 MED ORDER — PROPOFOL 10 MG/ML IV BOLUS
INTRAVENOUS | Status: DC | PRN
  Administered 2014-04-24: 200 mg via INTRAVENOUS

## 2014-04-24 MED ORDER — HYDROMORPHONE HCL PF 1 MG/ML IJ SOLN
INTRAMUSCULAR | Status: AC
  Administered 2014-04-24: 1 mg via INTRAVENOUS
  Filled 2014-04-24: qty 1

## 2014-04-24 MED ORDER — IOHEXOL 300 MG/ML  SOLN
25.0000 mL | Freq: Once | INTRAMUSCULAR | Status: DC | PRN
  Administered 2014-04-24: 50 mL via ORAL

## 2014-04-24 MED ORDER — LIDOCAINE HCL (CARDIAC) 20 MG/ML IV SOLN
INTRAVENOUS | Status: AC
  Filled 2014-04-24: qty 10

## 2014-04-24 MED ORDER — HYDROMORPHONE HCL PF 1 MG/ML IJ SOLN
0.2500 mg | INTRAMUSCULAR | Status: DC | PRN
  Administered 2014-04-24 (×2): 0.5 mg via INTRAVENOUS

## 2014-04-24 MED ORDER — OXYCODONE-ACETAMINOPHEN 5-325 MG PO TABS
1.0000 | ORAL_TABLET | ORAL | Status: DC | PRN
  Administered 2014-04-24 – 2014-04-25 (×2): 1 via ORAL
  Filled 2014-04-24: qty 1

## 2014-04-24 MED ORDER — ELVITEG-COBIC-EMTRICIT-TENOFDF 150-150-200-300 MG PO TABS
1.0000 | ORAL_TABLET | Freq: Every day | ORAL | Status: DC
  Administered 2014-04-25: 1 via ORAL
  Filled 2014-04-24 (×2): qty 1

## 2014-04-24 MED ORDER — PANTOPRAZOLE SODIUM 40 MG IV SOLR
40.0000 mg | Freq: Every day | INTRAVENOUS | Status: DC
  Administered 2014-04-24: 40 mg via INTRAVENOUS
  Filled 2014-04-24 (×2): qty 40

## 2014-04-24 MED ORDER — MORPHINE SULFATE 2 MG/ML IJ SOLN: 2.0000 mg | INTRAMUSCULAR | Status: DC | PRN

## 2014-04-24 MED ORDER — FENTANYL CITRATE 0.05 MG/ML IJ SOLN
INTRAMUSCULAR | Status: DC | PRN
  Administered 2014-04-24 (×2): 50 ug via INTRAVENOUS
  Administered 2014-04-24: 100 ug via INTRAVENOUS
  Administered 2014-04-24: 50 ug via INTRAVENOUS

## 2014-04-24 MED ORDER — FENTANYL CITRATE 0.05 MG/ML IJ SOLN
INTRAMUSCULAR | Status: AC
  Filled 2014-04-24: qty 5

## 2014-04-24 MED ORDER — LACTATED RINGERS IV SOLN
INTRAVENOUS | Status: DC
  Administered 2014-04-24: 14:00:00 via INTRAVENOUS

## 2014-04-24 MED ORDER — IOHEXOL 300 MG/ML  SOLN: 50.0000 mL | Freq: Once | INTRAMUSCULAR | Status: AC | PRN

## 2014-04-24 MED ORDER — ONDANSETRON HCL 4 MG/2ML IJ SOLN: 4.0000 mg | Freq: Once | INTRAMUSCULAR | Status: DC | PRN

## 2014-04-24 MED ORDER — NEOSTIGMINE METHYLSULFATE 10 MG/10ML IV SOLN
INTRAVENOUS | Status: DC | PRN
  Administered 2014-04-24: 3 mg via INTRAVENOUS

## 2014-04-24 MED ORDER — MIDAZOLAM HCL 2 MG/2ML IJ SOLN
INTRAMUSCULAR | Status: AC
  Filled 2014-04-24: qty 2

## 2014-04-24 MED ORDER — GLYCOPYRROLATE 0.2 MG/ML IJ SOLN
INTRAMUSCULAR | Status: DC | PRN
  Administered 2014-04-24: 0.2 mg via INTRAVENOUS
  Administered 2014-04-24: 0.4 mg via INTRAVENOUS

## 2014-04-24 MED ORDER — ONDANSETRON HCL 4 MG/2ML IJ SOLN: 4.0000 mg | Freq: Four times a day (QID) | INTRAMUSCULAR | Status: DC | PRN

## 2014-04-24 MED ORDER — HYDROMORPHONE HCL PF 1 MG/ML IJ SOLN
INTRAMUSCULAR | Status: AC
  Filled 2014-04-24: qty 1

## 2014-04-24 MED ORDER — MORPHINE SULFATE 4 MG/ML IJ SOLN
4.0000 mg | Freq: Once | INTRAMUSCULAR | Status: AC
Start: 2014-04-24 — End: 2014-04-24
  Administered 2014-04-24: 4 mg via INTRAVENOUS
  Filled 2014-04-24: qty 1

## 2014-04-24 MED ORDER — OXYCODONE-ACETAMINOPHEN 5-325 MG PO TABS
ORAL_TABLET | ORAL | Status: AC
  Administered 2014-04-25: 1 via ORAL
  Filled 2014-04-24: qty 1

## 2014-04-24 MED ORDER — ONDANSETRON HCL 4 MG/2ML IJ SOLN
4.0000 mg | Freq: Once | INTRAMUSCULAR | Status: AC
  Administered 2014-04-24: 4 mg via INTRAVENOUS
  Filled 2014-04-24: qty 2

## 2014-04-24 MED ORDER — BUPIVACAINE-EPINEPHRINE 0.25% -1:200000 IJ SOLN
INTRAMUSCULAR | Status: DC | PRN
  Administered 2014-04-24: 20 mL

## 2014-04-24 MED ORDER — CEFAZOLIN SODIUM-DEXTROSE 2-3 GM-% IV SOLR
INTRAVENOUS | Status: AC
  Filled 2014-04-24: qty 50

## 2014-04-24 MED ORDER — ONDANSETRON HCL 4 MG/2ML IJ SOLN
INTRAMUSCULAR | Status: DC | PRN
  Administered 2014-04-24: 4 mg via INTRAVENOUS

## 2014-04-24 MED ORDER — 0.9 % SODIUM CHLORIDE (POUR BTL) OPTIME
TOPICAL | Status: DC | PRN
  Administered 2014-04-24: 1000 mL

## 2014-04-24 MED ORDER — LIDOCAINE HCL (CARDIAC) 20 MG/ML IV SOLN
INTRAVENOUS | Status: DC | PRN
  Administered 2014-04-24: 100 mg via INTRAVENOUS

## 2014-04-24 MED ORDER — SODIUM CHLORIDE 0.9 % IV BOLUS (SEPSIS)
500.0000 mL | Freq: Once | INTRAVENOUS | Status: AC
  Administered 2014-04-24: 500 mL via INTRAVENOUS

## 2014-04-24 MED ORDER — SODIUM CHLORIDE 0.9 % IR SOLN
Status: DC | PRN
  Administered 2014-04-24: 1000 mL

## 2014-04-24 MED ORDER — SODIUM CHLORIDE 0.9 % IV SOLN
3.0000 g | Freq: Four times a day (QID) | INTRAVENOUS | Status: DC
  Administered 2014-04-24 – 2014-04-25 (×4): 3 g via INTRAVENOUS
  Filled 2014-04-24 (×7): qty 3

## 2014-04-24 MED ORDER — PROPOFOL 10 MG/ML IV BOLUS
INTRAVENOUS | Status: AC
  Filled 2014-04-24: qty 20

## 2014-04-24 MED ORDER — CEFAZOLIN SODIUM-DEXTROSE 2-3 GM-% IV SOLR
INTRAVENOUS | Status: DC | PRN
  Administered 2014-04-24: 2 g via INTRAVENOUS

## 2014-04-24 MED ORDER — BUPIVACAINE-EPINEPHRINE (PF) 0.25% -1:200000 IJ SOLN
INTRAMUSCULAR | Status: AC
  Filled 2014-04-24: qty 30

## 2014-04-24 MED ORDER — LACTATED RINGERS IV SOLN
INTRAVENOUS | Status: DC | PRN
  Administered 2014-04-24 (×2): via INTRAVENOUS

## 2014-04-24 MED ORDER — ROCURONIUM BROMIDE 100 MG/10ML IV SOLN
INTRAVENOUS | Status: DC | PRN
  Administered 2014-04-24: 40 mg via INTRAVENOUS

## 2014-04-24 MED ORDER — TAMSULOSIN HCL 0.4 MG PO CAPS
0.4000 mg | ORAL_CAPSULE | Freq: Every day | ORAL | Status: DC
  Administered 2014-04-24 – 2014-04-25 (×2): 0.4 mg via ORAL
  Filled 2014-04-24 (×2): qty 1

## 2014-04-24 MED ORDER — SUCCINYLCHOLINE CHLORIDE 20 MG/ML IJ SOLN
INTRAMUSCULAR | Status: AC
  Filled 2014-04-24: qty 1

## 2014-04-24 MED ORDER — PANTOPRAZOLE SODIUM 40 MG IV SOLR
40.0000 mg | Freq: Once | INTRAVENOUS | Status: AC
  Administered 2014-04-24: 40 mg via INTRAVENOUS
  Filled 2014-04-24: qty 40

## 2014-04-24 MED ORDER — POTASSIUM CHLORIDE IN NACL 20-0.9 MEQ/L-% IV SOLN
INTRAVENOUS | Status: DC
Start: 2014-04-24 — End: 2014-04-25
  Filled 2014-04-24 (×2): qty 1000

## 2014-04-24 MED ORDER — POTASSIUM CHLORIDE IN NACL 20-0.9 MEQ/L-% IV SOLN
INTRAVENOUS | Status: DC
  Administered 2014-04-24: 10:00:00 via INTRAVENOUS
  Filled 2014-04-24 (×3): qty 1000

## 2014-04-24 SURGICAL SUPPLY — 41 items
APPLIER CLIP 5 13 M/L LIGAMAX5 (MISCELLANEOUS) ×3
BANDAGE ADH SHEER 1  50/CT (GAUZE/BANDAGES/DRESSINGS) ×12 IMPLANT
BENZOIN TINCTURE PRP APPL 2/3 (GAUZE/BANDAGES/DRESSINGS) ×3 IMPLANT
CANISTER SUCTION 2500CC (MISCELLANEOUS) ×3 IMPLANT
CHLORAPREP W/TINT 26ML (MISCELLANEOUS) ×3 IMPLANT
CLIP APPLIE 5 13 M/L LIGAMAX5 (MISCELLANEOUS) ×1 IMPLANT
CONT SPEC 4OZ CLIKSEAL STRL BL (MISCELLANEOUS) ×3 IMPLANT
COVER MAYO STAND STRL (DRAPES) IMPLANT
COVER SURGICAL LIGHT HANDLE (MISCELLANEOUS) ×3 IMPLANT
DECANTER SPIKE VIAL GLASS SM (MISCELLANEOUS) IMPLANT
DRAPE C-ARM 42X72 X-RAY (DRAPES) IMPLANT
DRAPE UTILITY 15X26 W/TAPE STR (DRAPE) ×6 IMPLANT
ELECT REM PT RETURN 9FT ADLT (ELECTROSURGICAL) ×3
ELECTRODE REM PT RTRN 9FT ADLT (ELECTROSURGICAL) ×1 IMPLANT
GLOVE BIO SURGEON STRL SZ7.5 (GLOVE) ×3 IMPLANT
GLOVE BIOGEL PI IND STRL 7.0 (GLOVE) ×1 IMPLANT
GLOVE BIOGEL PI IND STRL 7.5 (GLOVE) ×1 IMPLANT
GLOVE BIOGEL PI INDICATOR 7.0 (GLOVE) ×2
GLOVE BIOGEL PI INDICATOR 7.5 (GLOVE) ×2
GLOVE SURG SIGNA 7.5 PF LTX (GLOVE) ×3 IMPLANT
GLOVE SURG SS PI 7.0 STRL IVOR (GLOVE) ×3 IMPLANT
GOWN STRL REUS W/ TWL LRG LVL3 (GOWN DISPOSABLE) ×2 IMPLANT
GOWN STRL REUS W/ TWL XL LVL3 (GOWN DISPOSABLE) ×1 IMPLANT
GOWN STRL REUS W/TWL LRG LVL3 (GOWN DISPOSABLE) ×4
GOWN STRL REUS W/TWL XL LVL3 (GOWN DISPOSABLE) ×2
KIT BASIN OR (CUSTOM PROCEDURE TRAY) ×3 IMPLANT
KIT ROOM TURNOVER OR (KITS) ×3 IMPLANT
NS IRRIG 1000ML POUR BTL (IV SOLUTION) ×3 IMPLANT
PAD ARMBOARD 7.5X6 YLW CONV (MISCELLANEOUS) ×3 IMPLANT
POUCH SPECIMEN RETRIEVAL 10MM (ENDOMECHANICALS) ×3 IMPLANT
SCISSORS LAP 5X35 DISP (ENDOMECHANICALS) ×3 IMPLANT
SET CHOLANGIOGRAPH 5 50 .035 (SET/KITS/TRAYS/PACK) IMPLANT
SET IRRIG TUBING LAPAROSCOPIC (IRRIGATION / IRRIGATOR) ×3 IMPLANT
SLEEVE ENDOPATH XCEL 5M (ENDOMECHANICALS) ×6 IMPLANT
SPECIMEN JAR SMALL (MISCELLANEOUS) IMPLANT
SUT MON AB 4-0 PC3 18 (SUTURE) ×3 IMPLANT
TOWEL OR 17X24 6PK STRL BLUE (TOWEL DISPOSABLE) IMPLANT
TOWEL OR 17X26 10 PK STRL BLUE (TOWEL DISPOSABLE) ×3 IMPLANT
TRAY LAPAROSCOPIC (CUSTOM PROCEDURE TRAY) ×3 IMPLANT
TROCAR XCEL BLUNT TIP 100MML (ENDOMECHANICALS) ×3 IMPLANT
TROCAR XCEL NON-BLD 5MMX100MML (ENDOMECHANICALS) ×3 IMPLANT

## 2014-04-24 NOTE — Transfer of Care (Signed)
Immediate Anesthesia Transfer of Care Note  Patient: Brett Sims  Procedure(s) Performed: Procedure(s): LAPAROSCOPIC CHOLECYSTECTOMY (N/A)  Patient Location: PACU  Anesthesia Type:General  Level of Consciousness: awake, alert , oriented and patient cooperative  Airway & Oxygen Therapy: Patient Spontanous Breathing and Patient connected to nasal cannula oxygen  Post-op Assessment: Report given to PACU RN, Post -op Vital signs reviewed and stable and Patient moving all extremities  Post vital signs: Reviewed and stable  Complications: No apparent anesthesia complications

## 2014-04-24 NOTE — Anesthesia Preprocedure Evaluation (Addendum)
Anesthesia Evaluation  Patient identified by MRN, date of birth, ID band Patient awake    History of Anesthesia Complications (+) PONV  Airway       Dental   Pulmonary neg pulmonary ROS,          Cardiovascular negative cardio ROS      Neuro/Psych negative neurological ROS     GI/Hepatic Neg liver ROS, GERD-  Medicated and Controlled,  Endo/Other  negative endocrine ROS  Renal/GU negative Renal ROS     Musculoskeletal   Abdominal   Peds  Hematology negative hematology ROS (+) HIV,   Anesthesia Other Findings   Reproductive/Obstetrics                           Anesthesia Physical Anesthesia Plan  ASA: II  Anesthesia Plan: General   Post-op Pain Management:    Induction: Intravenous  Airway Management Planned: Oral ETT  Additional Equipment:   Intra-op Plan:   Post-operative Plan: Extubation in OR  Informed Consent: I have reviewed the patients History and Physical, chart, labs and discussed the procedure including the risks, benefits and alternatives for the proposed anesthesia with the patient or authorized representative who has indicated his/her understanding and acceptance.     Plan Discussed with: CRNA, Anesthesiologist and Surgeon  Anesthesia Plan Comments:         Anesthesia Quick Evaluation

## 2014-04-24 NOTE — ED Notes (Signed)
Patient transported to Ultrasound 

## 2014-04-24 NOTE — ED Notes (Signed)
Patient transported to CT 

## 2014-04-24 NOTE — ED Notes (Signed)
Patient transported back from CT 

## 2014-04-24 NOTE — H&P (Signed)
Brett Sims is an 68 y.o. male.   PCP:  Irven Shelling, MD ID: Dr. Michel Bickers GI: Dr. Earle Gell General surgery:  Dr. Johnathan Hausen   Chief Complaint: abdominal pain HPI: 68 y/o who started having abdominal pain 7/13.  He says he woke with significant pain and felt like his stomach was on fire.  Pain lasted about 6 hours and then got better.  He did well for the next 1.5 days, no problems.  Last evening he had barbeque for dinner and then started having pain about 5 PM.  He tried Gas ex, and Tums with no success.He again describes it as burning and mid epigastric.  He got no relief with anything, including oatmeal.  he didn't really have any nausea till later. No vomiting,  No fever.  He tried to find an Urgent care, but nothing was open so he presented to the ED last PM.  The burning pain has resolved again, but he continues to be tender RUQ and some in the RLQ.    Work up in the ED shows he is afebrile, VSS.  Creatinine is up , glucose is also up.  WBC is normal, LFT's and lipase is normal.  Platelets are slightly decreased.  A CT scan was ordered first and this shows: Cholelithiasis with stone in the gallbladder neck and subtle pericholecystic edema. If right upper quadrant symptoms, ultrasound could further evaluate. 2. Hepatic steatosis. 3. Mild splenomegaly.  Ultrasound shows: There is a 7 mm stone in the gallbladder with sludge as well as gallbladder wall thickening and a positive sonographic Murphy sign. Common bile duct: Diameter: 7.3 mm, slightly prominent for the patient's age but there is no visible stone. No dilated intrahepatic bile ducts. He continue to have pain on palpation of the RUQ, and some but less RLQ.  We are ask to see.  Past Medical History  Diagnosis Date  HIV disease   Hx of colon polyp   Hx of skin melanoma (back)   BPH   GERD        Past Surgical History  Procedure Laterality Date   Laparoscopic partial transverse colectomy   02/27/06 Dr. Johnathan Hausen   Colonoscopy with polypectomy.  08/30/05 Dr. Earle Gell   Bilateral cataract extractions with lenses implants     Excision of back skin Melanoma with node bx [facility]   . Hernia repair      No family history on file. Social History:  reports that he has never smoked. He has never used smokeless tobacco. He reports that he does not drink alcohol or use illicit drugs.  Allergies:  Allergies  Allergen Reactions  . Codeine Other (See Comments)    Mood changes   . Erythromycin Nausea And Vomiting  . Sulfonamide Derivatives Nausea And Vomiting    Prior to Admission medications   Medication Sig Start Date End Date Taking? Authorizing Provider  bismuth subsalicylate (PEPTO BISMOL) 262 MG/15ML suspension Take 30 mLs by mouth once.   Yes Historical Provider, MD  carboxymethylcellulose (REFRESH TEARS) 0.5 % SOLN Place 1 drop into both eyes 3 (three) times daily as needed (dry eyes).   Yes Historical Provider, MD  Clindamycin Phosphate foam Apply 1 application topically 2 (two) times daily as needed (dermatitis).  08/23/13  Yes Historical Provider, MD  elvitegravir-cobicistat-emtricitabine-tenofovir (STRIBILD) 150-150-200-300 MG TABS tablet Take 1 tablet by mouth daily with breakfast. 11/12/13  Yes Michel Bickers, MD  loperamide (IMODIUM A-D) 2 MG tablet Take 2 mg by mouth daily.  Yes Historical Provider, MD  Multiple Vitamin (MULTIVITAMIN) tablet Take 1 tablet by mouth daily.   Yes Historical Provider, MD  RABEprazole (ACIPHEX) 20 MG tablet Take 20 mg by mouth daily.     Yes Historical Provider, MD  Simethicone (GAS-X PO) Take 2 tablets by mouth once.   Yes Historical Provider, MD  tamsulosin (FLOMAX) 0.4 MG CAPS capsule Take 0.4 mg by mouth daily.   Yes Historical Provider, MD     Results for orders placed during the hospital encounter of 04/24/14 (from the past 48 hour(s))  CBC WITH DIFFERENTIAL     Status: Abnormal   Collection Time    04/23/14 10:58 PM      Result Value Ref Range    WBC 6.6  4.0 - 10.5 K/uL   RBC 4.41  4.22 - 5.81 MIL/uL   Hemoglobin 13.9  13.0 - 17.0 g/dL   HCT 38.5 (*) 39.0 - 52.0 %   MCV 87.3  78.0 - 100.0 fL   MCH 31.5  26.0 - 34.0 pg   MCHC 36.1 (*) 30.0 - 36.0 g/dL   RDW 14.1  11.5 - 15.5 %   Platelets 121 (*) 150 - 400 K/uL   Neutrophils Relative % 75  43 - 77 %   Neutro Abs 4.9  1.7 - 7.7 K/uL   Lymphocytes Relative 17  12 - 46 %   Lymphs Abs 1.1  0.7 - 4.0 K/uL   Monocytes Relative 7  3 - 12 %   Monocytes Absolute 0.5  0.1 - 1.0 K/uL   Eosinophils Relative 1  0 - 5 %   Eosinophils Absolute 0.1  0.0 - 0.7 K/uL   Basophils Relative 0  0 - 1 %   Basophils Absolute 0.0  0.0 - 0.1 K/uL  COMPREHENSIVE METABOLIC PANEL     Status: Abnormal   Collection Time    04/23/14 10:58 PM      Result Value Ref Range   Sodium 140  137 - 147 mEq/L   Potassium 4.4  3.7 - 5.3 mEq/L   Chloride 100  96 - 112 mEq/L   CO2 26  19 - 32 mEq/L   Glucose, Bld 159 (*) 70 - 99 mg/dL   BUN 19  6 - 23 mg/dL   Creatinine, Ser 1.52 (*) 0.50 - 1.35 mg/dL   Calcium 9.4  8.4 - 10.5 mg/dL   Total Protein 7.5  6.0 - 8.3 g/dL   Albumin 4.1  3.5 - 5.2 g/dL   AST 36  0 - 37 U/L   ALT 52  0 - 53 U/L   Alkaline Phosphatase 75  39 - 117 U/L   Total Bilirubin 1.0  0.3 - 1.2 mg/dL   GFR calc non Af Amer 46 (*) >90 mL/min   GFR calc Af Amer 53 (*) >90 mL/min   Comment: (NOTE)     The eGFR has been calculated using the CKD EPI equation.     This calculation has not been validated in all clinical situations.     eGFR's persistently <90 mL/min signify possible Chronic Kidney     Disease.   Anion gap 14  5 - 15  LIPASE, BLOOD     Status: None   Collection Time    04/23/14 10:58 PM      Result Value Ref Range   Lipase 51  11 - 59 U/L  URINALYSIS, ROUTINE W REFLEX MICROSCOPIC     Status: Abnormal   Collection Time  04/23/14 10:58 PM      Result Value Ref Range   Color, Urine YELLOW  YELLOW   APPearance CLEAR  CLEAR   Specific Gravity, Urine 1.019  1.005 - 1.030    pH 6.5  5.0 - 8.0   Glucose, UA NEGATIVE  NEGATIVE mg/dL   Hgb urine dipstick NEGATIVE  NEGATIVE   Bilirubin Urine NEGATIVE  NEGATIVE   Ketones, ur NEGATIVE  NEGATIVE mg/dL   Protein, ur 30 (*) NEGATIVE mg/dL   Urobilinogen, UA 0.2  0.0 - 1.0 mg/dL   Nitrite NEGATIVE  NEGATIVE   Leukocytes, UA NEGATIVE  NEGATIVE  URINE MICROSCOPIC-ADD ON     Status: None   Collection Time    04/23/14 10:58 PM      Result Value Ref Range   WBC, UA 0-2  <3 WBC/hpf   RBC / HPF 0-2  <3 RBC/hpf   Bacteria, UA RARE  RARE   Ct Abdomen Pelvis Wo Contrast  04/24/2014   CLINICAL DATA:  Abdominal pain  EXAM: CT ABDOMEN AND PELVIS WITHOUT CONTRAST  TECHNIQUE: Multidetector CT imaging of the abdomen and pelvis was performed following the standard protocol without IV contrast.  COMPARISON:  None.  FINDINGS: BODY WALL: Unremarkable.  LOWER CHEST: Mild cardiomegaly. There is likely proximal circumflex coronary artery atherosclerosis. Granulomatous changes to the right lower lobe and lower mediastinal nodes.  ABDOMEN/PELVIS:  Liver: Diffuse, fairly marked fatty infiltration of the liver with occasional sparing.  Biliary: Cholelithiasis with mild pericholecystic haziness. 1 of the stones is at the gallbladder neck.  Pancreas: Unremarkable.  Spleen: Mild splenomegaly with 16 cm length and up to 7 cm thickness. Mild granulomatous changes.  Adrenals: Unremarkable.  Kidneys and ureters: 1 cm presumed cyst in the interpolar right kidney. There is a 1 cm presumed cyst in the interpolar left kidney which appears to have layering milk of calcium.  Bladder: Unremarkable.  Reproductive: Symmetric mild haziness around the seminal vesicles which is likely within normal limits.  Bowel: Distal colonic diverticulosis. Negative appendix.  Retroperitoneum: No mass or adenopathy.  Peritoneum: No ascites or pneumoperitoneum.  Vascular: No acute abnormality.  OSSEOUS: No acute abnormalities.  IMPRESSION: 1. Cholelithiasis with stone in the gallbladder  neck and subtle pericholecystic edema. If right upper quadrant symptoms, ultrasound could further evaluate. 2. Hepatic steatosis. 3. Mild splenomegaly.   Electronically Signed   By: Jorje Guild M.D.   On: 04/24/2014 05:00   US Abdomen Complete  04/24/2014   CLINICAL DATA:  Upper abdominal pain.  EXAM: ULTRASOUND ABDOMEN COMPLETE  COMPARISON:  CT scan dated 04/24/2014  FINDINGS: Gallbladder:  There is a 7 mm stone in the gallbladder with sludge as well as gallbladder wall thickening and a positive sonographic Murphy's sign.  Common bile duct:  Diameter: 7.3 mm, slightly prominent for the patient's age but there is no visible stone. No dilated intrahepatic bile ducts.  Liver:  Very dense echogenic liver parenchyma without focal lesions.  IVC:  No abnormality visualized.  Pancreas:  Obscured by bowel.  Spleen:  12.0 cm, normal.  Right Kidney:  Length: 12.7 cm. 2.3 cm cyst in the upper pole. Small dependent internal echoes.  Left Kidney:  Length: 11.7 cm. 1.3 cm and 1.1 cm simple appearing cysts in the mid left kidney.  Abdominal aorta:  Normal.  Other findings:  None.  IMPRESSION: Cholelithiasis and acute cholecystitis. Slightly complex cyst in the upper pole the right kidney, probably representing a small hemorrhagic cyst.   Electronically Signed   By:  Rozetta Nunnery M.D.   On: 04/24/2014 07:33    Review of Systems  Constitutional: Negative.   HENT: Positive for hearing loss (some with age).   Eyes:       S/p cataract surgery with bifocal lenses  Respiratory: Negative.   Cardiovascular: Negative.   Gastrointestinal: Positive for heartburn, nausea and abdominal pain (burning abdominal pain, lasting about 6 hours each episode.). Negative for diarrhea, constipation, blood in stool and melena.  Genitourinary: Negative.        Slow  Musculoskeletal: Negative.   Skin: Negative.   Neurological: Negative.        He has some numbness in his feet.  PCP is following  Endo/Heme/Allergies: Negative.    Psychiatric/Behavioral: Negative.     Blood pressure 147/86, pulse 85, temperature 98.5 F (36.9 C), temperature source Oral, resp. rate 17, height _0  (1.753 m), weight 87.544 kg (193 lb), SpO2 97.00%. Physical Exam  Constitutional: He is oriented to person, place, and time. He appears well-developed and well-nourished. No distress.  HENT:  Head: Normocephalic and atraumatic.  Nose: Nose normal.  Eyes: Conjunctivae and EOM are normal. Pupils are equal, round, and reactive to light. Right eye exhibits no discharge. Left eye exhibits no discharge. No scleral icterus.  Neck: Normal range of motion. Neck supple. No JVD present. No tracheal deviation present. No thyromegaly present.  Cardiovascular: Normal rate, regular rhythm, normal heart sounds and intact distal pulses.  Exam reveals no gallop.   No murmur heard. Respiratory: Effort normal and breath sounds normal. No respiratory distress. He has no wheezes. He has no rales. He exhibits no tenderness.  GI: He exhibits no mass. Distention: large abdomen, and he says it more distended than usual. There is tenderness (RUQ greater than RLQ, no pain on the left side). There is no rebound and no guarding.  Prior umbilical hernia repair, I can feel sutures, he says it distends more than before.  Right mid transverse abdominal scar from prior partial colectomy.  Musculoskeletal: He exhibits no edema and no tenderness.  Lymphadenopathy:    He has no cervical adenopathy.  Neurological: He is alert and oriented to person, place, and time. No cranial nerve deficit.  Skin: Skin is warm and dry. No rash noted. He is not diaphoretic. No erythema. No pallor.  Psychiatric: He has a normal mood and affect. His behavior is normal. Judgment and thought content normal.     Assessment/Plan 1.  Cholecystitis and cholelithiasis 2.  HIV 3.  Hx of umbilical hernia repair and partial colectomy 4.  Hx of skin melanoma of the back with resection and node bx  ([facility]) 5.  Renal insuffiencey (Mild) 6.  BPH 7.  GERD  Plan:  I will admit, and start hydration, antibiotics review with Dr. Ninfa Linden and set up for cholecystectomy.  I don't know if we will be able to do today.  Keep him NPO for now.  Jazmin Vensel 04/24/2014, 8:26 AM

## 2014-04-24 NOTE — H&P (Signed)
I have seen and examined the patient and agree with the assessment and plans. Acute cholecystitis with cholelithiasis. Recommend Laparoscopic cholecystectomy  I discussed the procedure in detail.    We discussed the risks and benefits of a laparoscopic cholecystectomy and possible cholangiogram including, but not limited to bleeding, infection, injury to surrounding structures such as the intestine or liver, bile leak, retained gallstones, need to convert to an open procedure, prolonged diarrhea, blood clots such as  DVT, common bile duct injury, anesthesia risks, and possible need for additional procedures.  The likelihood of improvement in symptoms and return to the patient's normal status is good. We discussed the typical post-operative recovery course.  Apolonio Cutting A. Ninfa Linden  MD, FACS

## 2014-04-24 NOTE — ED Provider Notes (Signed)
CSN: [id number]     Arrival date & time 04/23/14  2247 History   First MD Initiated Contact with Patient 04/24/14 0156     Chief Complaint  Patient presents with  . Abdominal Pain     (Consider location/radiation/quality/duration/timing/severity/associated sxs/prior Treatment) HPI Comments: Patient presents with abdominal pain. He states 2 days ago he had onset of pain across his upper abdomen. He describes as a burning pain. It was bad  that day but then it went away. Yesterday he didn't have any pain. This afternoon he started having onset of the pain again. He says it is very intense and across his upper abdomen. He feels like his abdomen is more distended than normal. He had a bowel movement this morning does not feel like he is passing gas currently. He denies any fevers or chills. He's had some nausea but no vomiting. He denies any urinary symptoms. He has a history of an umbilical hernia repair but denies any other abdominal surgeries. He is HIV positive and takes an antiviral medication. He states his CD4 counts are good and that his viral load is undetectable.  Patient is a 68 y.o. male presenting with abdominal pain.  Abdominal Pain Associated symptoms: nausea   Associated symptoms: no chest pain, no chills, no cough, no diarrhea, no fatigue, no fever, no hematuria, no shortness of breath and no vomiting     Past Medical History  Diagnosis Date  . GERD (gastroesophageal reflux disease)   . HIV disease   . Complication of anesthesia   . PONV (postoperative nausea and vomiting)   . Cholecystolithiasis    Past Surgical History  Procedure Laterality Date  . Hernia repair    . Polypectomy  2010     removed one foot of intestine    by Dr Wynetta Emery  . Inner ear surgery    . Melanoma excision      from back   History reviewed. No pertinent family history. History  Substance Use Topics  . Smoking status: Never Smoker   . Smokeless tobacco: Never Used  . Alcohol Use: No     Review of Systems  Constitutional: Negative for fever, chills, diaphoresis and fatigue.  HENT: Negative for congestion, rhinorrhea and sneezing.   Eyes: Negative.   Respiratory: Negative for cough, chest tightness and shortness of breath.   Cardiovascular: Negative for chest pain and leg swelling.  Gastrointestinal: Positive for nausea, abdominal pain and abdominal distention. Negative for vomiting, diarrhea and blood in stool.  Genitourinary: Negative for frequency, hematuria, flank pain and difficulty urinating.  Musculoskeletal: Negative for arthralgias and back pain.  Skin: Negative for rash.  Neurological: Negative for dizziness, speech difficulty, weakness, numbness and headaches.      Allergies  Codeine; Erythromycin; and Sulfonamide derivatives  Home Medications   Prior to Admission medications   Medication Sig Start Date End Date Taking? Authorizing Provider  bismuth subsalicylate (PEPTO BISMOL) 262 MG/15ML suspension Take 30 mLs by mouth once.   Yes Historical Provider, MD  carboxymethylcellulose (REFRESH TEARS) 0.5 % SOLN Place 1 drop into both eyes 3 (three) times daily as needed (dry eyes).   Yes Historical Provider, MD  Clindamycin Phosphate foam Apply 1 application topically 2 (two) times daily as needed (dermatitis).  08/23/13  Yes Historical Provider, MD  elvitegravir-cobicistat-emtricitabine-tenofovir (STRIBILD) 150-150-200-300 MG TABS tablet Take 1 tablet by mouth daily with breakfast. 11/12/13  Yes Michel Bickers, MD  loperamide (IMODIUM A-D) 2 MG tablet Take 2 mg by mouth daily.  Yes Historical Provider, MD  Multiple Vitamin (MULTIVITAMIN) tablet Take 1 tablet by mouth daily.   Yes Historical Provider, MD  RABEprazole (ACIPHEX) 20 MG tablet Take 20 mg by mouth daily.     Yes Historical Provider, MD  Simethicone (GAS-X PO) Take 2 tablets by mouth once.   Yes Historical Provider, MD  tamsulosin (FLOMAX) 0.4 MG CAPS capsule Take 0.4 mg by mouth daily.   Yes  Historical Provider, MD   BP 152/86  Pulse 78  Temp(Src) 97.8 F (36.6 C) (Oral)  Resp 18  Ht 5\' 9"  (1.753 m)  Wt 193 lb (87.544 kg)  BMI 28.49 kg/m2  SpO2 96% Physical Exam  Constitutional: He is oriented to person, place, and time. He appears well-developed and well-nourished.  HENT:  Head: Normocephalic and atraumatic.  Eyes: Pupils are equal, round, and reactive to light.  Neck: Normal range of motion. Neck supple.  Cardiovascular: Normal rate, regular rhythm and normal heart sounds.   Pulmonary/Chest: Effort normal and breath sounds normal. No respiratory distress. He has no wheezes. He has no rales. He exhibits no tenderness.  Abdominal: Soft. Bowel sounds are normal. There is tenderness (moderate tenderness to the upper abdomen. Umbilical area. No rebound or guarding). There is no rebound and no guarding.  Genitourinary:  No pain to the testicles or inguinal areas  Musculoskeletal: Normal range of motion. He exhibits no edema.  Lymphadenopathy:    He has no cervical adenopathy.  Neurological: He is alert and oriented to person, place, and time.  Skin: Skin is warm and dry. No rash noted.  Psychiatric: He has a normal mood and affect.    ED Course  Procedures (including critical care time) Labs Review Results for orders placed during the hospital encounter of 04/24/14  SURGICAL PCR SCREEN      Result Value Ref Range   MRSA, PCR NEGATIVE  NEGATIVE   Staphylococcus aureus NEGATIVE  NEGATIVE  CBC WITH DIFFERENTIAL      Result Value Ref Range   WBC 6.6  4.0 - 10.5 K/uL   RBC 4.41  4.22 - 5.81 MIL/uL   Hemoglobin 13.9  13.0 - 17.0 g/dL   HCT 38.5 (*) 39.0 - 52.0 %   MCV 87.3  78.0 - 100.0 fL   MCH 31.5  26.0 - 34.0 pg   MCHC 36.1 (*) 30.0 - 36.0 g/dL   RDW 14.1  11.5 - 15.5 %   Platelets 121 (*) 150 - 400 K/uL   Neutrophils Relative % 75  43 - 77 %   Neutro Abs 4.9  1.7 - 7.7 K/uL   Lymphocytes Relative 17  12 - 46 %   Lymphs Abs 1.1  0.7 - 4.0 K/uL   Monocytes  Relative 7  3 - 12 %   Monocytes Absolute 0.5  0.1 - 1.0 K/uL   Eosinophils Relative 1  0 - 5 %   Eosinophils Absolute 0.1  0.0 - 0.7 K/uL   Basophils Relative 0  0 - 1 %   Basophils Absolute 0.0  0.0 - 0.1 K/uL  COMPREHENSIVE METABOLIC PANEL      Result Value Ref Range   Sodium 140  137 - 147 mEq/L   Potassium 4.4  3.7 - 5.3 mEq/L   Chloride 100  96 - 112 mEq/L   CO2 26  19 - 32 mEq/L   Glucose, Bld 159 (*) 70 - 99 mg/dL   BUN 19  6 - 23 mg/dL   Creatinine, Ser 1.52 (*)  0.50 - 1.35 mg/dL   Calcium 9.4  8.4 - 10.5 mg/dL   Total Protein 7.5  6.0 - 8.3 g/dL   Albumin 4.1  3.5 - 5.2 g/dL   AST 36  0 - 37 U/L   ALT 52  0 - 53 U/L   Alkaline Phosphatase 75  39 - 117 U/L   Total Bilirubin 1.0  0.3 - 1.2 mg/dL   GFR calc non Af Amer 46 (*) >90 mL/min   GFR calc Af Amer 53 (*) >90 mL/min   Anion gap 14  5 - 15  LIPASE, BLOOD      Result Value Ref Range   Lipase 51  11 - 59 U/L  URINALYSIS, ROUTINE W REFLEX MICROSCOPIC      Result Value Ref Range   Color, Urine YELLOW  YELLOW   APPearance CLEAR  CLEAR   Specific Gravity, Urine 1.019  1.005 - 1.030   pH 6.5  5.0 - 8.0   Glucose, UA NEGATIVE  NEGATIVE mg/dL   Hgb urine dipstick NEGATIVE  NEGATIVE   Bilirubin Urine NEGATIVE  NEGATIVE   Ketones, ur NEGATIVE  NEGATIVE mg/dL   Protein, ur 30 (*) NEGATIVE mg/dL   Urobilinogen, UA 0.2  0.0 - 1.0 mg/dL   Nitrite NEGATIVE  NEGATIVE   Leukocytes, UA NEGATIVE  NEGATIVE  URINE MICROSCOPIC-ADD ON      Result Value Ref Range   WBC, UA 0-2  <3 WBC/hpf   RBC / HPF 0-2  <3 RBC/hpf   Bacteria, UA RARE  RARE   No results found.   Imaging Review Ct Abdomen Pelvis Wo Contrast  04/24/2014   CLINICAL DATA:  Abdominal pain  EXAM: CT ABDOMEN AND PELVIS WITHOUT CONTRAST  TECHNIQUE: Multidetector CT imaging of the abdomen and pelvis was performed following the standard protocol without IV contrast.  COMPARISON:  None.  FINDINGS: BODY WALL: Unremarkable.  LOWER CHEST: Mild cardiomegaly. There is  likely proximal circumflex coronary artery atherosclerosis. Granulomatous changes to the right lower lobe and lower mediastinal nodes.  ABDOMEN/PELVIS:  Liver: Diffuse, fairly marked fatty infiltration of the liver with occasional sparing.  Biliary: Cholelithiasis with mild pericholecystic haziness. 1 of the stones is at the gallbladder neck.  Pancreas: Unremarkable.  Spleen: Mild splenomegaly with 16 cm length and up to 7 cm thickness. Mild granulomatous changes.  Adrenals: Unremarkable.  Kidneys and ureters: 1 cm presumed cyst in the interpolar right kidney. There is a 1 cm presumed cyst in the interpolar left kidney which appears to have layering milk of calcium.  Bladder: Unremarkable.  Reproductive: Symmetric mild haziness around the seminal vesicles which is likely within normal limits.  Bowel: Distal colonic diverticulosis. Negative appendix.  Retroperitoneum: No mass or adenopathy.  Peritoneum: No ascites or pneumoperitoneum.  Vascular: No acute abnormality.  OSSEOUS: No acute abnormalities.  IMPRESSION: 1. Cholelithiasis with stone in the gallbladder neck and subtle pericholecystic edema. If right upper quadrant symptoms, ultrasound could further evaluate. 2. Hepatic steatosis. 3. Mild splenomegaly.   Electronically Signed   By: Jorje Guild M.D.   On: 04/24/2014 05:00   US Abdomen Complete  04/24/2014   CLINICAL DATA:  Upper abdominal pain.  EXAM: ULTRASOUND ABDOMEN COMPLETE  COMPARISON:  CT scan dated 04/24/2014  FINDINGS: Gallbladder:  There is a 7 mm stone in the gallbladder with sludge as well as gallbladder wall thickening and a positive sonographic Murphy's sign.  Common bile duct:  Diameter: 7.3 mm, slightly prominent for the patient's age but there is  no visible stone. No dilated intrahepatic bile ducts.  Liver:  Very dense echogenic liver parenchyma without focal lesions.  IVC:  No abnormality visualized.  Pancreas:  Obscured by bowel.  Spleen:  12.0 cm, normal.  Right Kidney:  Length: 12.7  cm. 2.3 cm cyst in the upper pole. Small dependent internal echoes.  Left Kidney:  Length: 11.7 cm. 1.3 cm and 1.1 cm simple appearing cysts in the mid left kidney.  Abdominal aorta:  Normal.  Other findings:  None.  IMPRESSION: Cholelithiasis and acute cholecystitis. Slightly complex cyst in the upper pole the right kidney, probably representing a small hemorrhagic cyst.   Electronically Signed   By: Rozetta Nunnery M.D.   On: 04/24/2014 07:33     EKG Interpretation None      MDM   Final diagnoses:  Cholecystitis    Patient presents with upper and mid abdominal pain. CT scan showed evidence of cholelithiasis and pericholecystic edema. An abdominal ultrasound is ordered and is still pending to assess for cholecystitis. Patient is turned over to Dr. Wyvonnia Dusky pending ultrasound.    Malvin Johns, MD 04/25/14 [phone]

## 2014-04-24 NOTE — Op Note (Signed)
Laparoscopic Cholecystectomy Procedure Note  Indications: This patient presents with symptomatic gallbladder disease and will undergo laparoscopic cholecystectomy.  Pre-operative Diagnosis: Calculus of gallbladder with acute cholecystitis, without mention of obstruction  Post-operative Diagnosis: Same  Surgeon: Coralie Keens A   Assistants: 0  Anesthesia: General endotracheal anesthesia  ASA Class: 3  Procedure Details  The patient was seen again in the Holding Room. The risks, benefits, complications, treatment options, and expected outcomes were discussed with the patient. The possibilities of reaction to medication, pulmonary aspiration, perforation of viscus, bleeding, recurrent infection, finding a normal gallbladder, the need for additional procedures, failure to diagnose a condition, the possible need to convert to an open procedure, and creating a complication requiring transfusion or operation were discussed with the patient. The likelihood of improving the patient's symptoms with return to their baseline status is good.  The patient and/or family concurred with the proposed plan, giving informed consent. The site of surgery properly noted. The patient was taken to Operating Room, identified as Brett Sims and the procedure verified as Laparoscopic Cholecystectomy with Intraoperative Cholangiogram. A Time Out was held and the above information confirmed.  Prior to the induction of general anesthesia, antibiotic prophylaxis was administered. General endotracheal anesthesia was then administered and tolerated well. After the induction, the abdomen was prepped with Chloraprep and draped in sterile fashion. The patient was positioned in the supine position.  Local anesthetic agent was injected into the skin near the umbilicus and an incision made. We dissected down to the abdominal fascia with blunt dissection.  The fascia was incised vertically and we entered the peritoneal cavity  bluntly.  A pursestring suture of 0-Vicryl was placed around the fascial opening.  The Hasson cannula was inserted and secured with the stay suture.  Pneumoperitoneum was then created with CO2 and tolerated well without any adverse changes in the patient's vital signs. An 11-mm port was placed in the subxiphoid position.  Two 5-mm ports were placed in the right upper quadrant. All skin incisions were infiltrated with a local anesthetic agent before making the incision and placing the trocars.   We positioned the patient in reverse Trendelenburg, tilted slightly to the patient's left.  The gallbladder was identified, the fundus grasped and retracted cephalad. Adhesions were lysed bluntly and with the electrocautery where indicated, taking care not to injure any adjacent organs or viscus. The infundibulum was grasped and retracted laterally, exposing the peritoneum overlying the triangle of Calot. This was then divided and exposed in a blunt fashion. The cystic duct was clearly identified and bluntly dissected circumferentially. A critical view of the cystic duct and cystic artery was obtained.  The cystic duct was then ligated with clips and divided. The cystic artery was, dissected free, ligated with clips and divided as well.   The gallbladder was dissected from the liver bed in retrograde fashion with the electrocautery. The gallbladder was removed and placed in an Endocatch sac. The liver bed was irrigated and inspected. Hemostasis was achieved with the electrocautery. Copious irrigation was utilized and was repeatedly aspirated until clear.  The gallbladder and Endocatch sac were then removed through the umbilical port site.  The pursestring suture was used to close the umbilical fascia.    We again inspected the right upper quadrant for hemostasis.  Pneumoperitoneum was released as we removed the trocars.  4-0 Monocryl was used to close the skin.   Benzoin, steri-strips, and clean dressings were applied.  The patient was then extubated and brought to the recovery room  in stable condition. Instrument, sponge, and needle counts were correct at closure and at the conclusion of the case.   Findings: Cholecystitis with Cholelithiasis  Estimated Blood Loss: Minimal         Drains: 0         Specimens: Gallbladder           Complications: None; patient tolerated the procedure well.         Disposition: PACU - hemodynamically stable.         Condition: stable

## 2014-04-24 NOTE — Anesthesia Postprocedure Evaluation (Signed)
  Anesthesia Post-op Note  Patient: Brett Sims  Procedure(s) Performed: Procedure(s): LAPAROSCOPIC CHOLECYSTECTOMY (N/A)  Patient Location: PACU  Anesthesia Type:General  Level of Consciousness: awake, alert , oriented and patient cooperative  Airway and Oxygen Therapy: Patient Spontanous Breathing  Post-op Pain: mild  Post-op Assessment: Post-op Vital signs reviewed, Patient's Cardiovascular Status Stable, Respiratory Function Stable, Patent Airway, No signs of Nausea or vomiting and Pain level controlled  Post-op Vital Signs: stable  Last Vitals:  Filed Vitals:   04/24/14 1609  BP: 195/123  Pulse:   Temp: 36.8 C  Resp: 16    Complications: No apparent anesthesia complications

## 2014-04-24 NOTE — ED Notes (Signed)
Gave report to Cassell Smiles RN on [address]

## 2014-04-25 ENCOUNTER — Encounter (HOSPITAL_COMMUNITY): Payer: Self-pay | Admitting: Surgery

## 2014-04-25 DIAGNOSIS — K8000 Calculus of gallbladder with acute cholecystitis without obstruction: Secondary | ICD-10-CM | POA: Diagnosis not present

## 2014-04-25 LAB — CBC
HCT: 35.1 % — ABNORMAL LOW (ref 39.0–52.0)
HEMOGLOBIN: 12.5 g/dL — AB (ref 13.0–17.0)
MCH: 31.9 pg (ref 26.0–34.0)
MCHC: 35.6 g/dL (ref 30.0–36.0)
MCV: 89.5 fL (ref 78.0–100.0)
Platelets: 122 10*3/uL — ABNORMAL LOW (ref 150–400)
RBC: 3.92 MIL/uL — ABNORMAL LOW (ref 4.22–5.81)
RDW: 14.5 % (ref 11.5–15.5)
WBC: 6.8 10*3/uL (ref 4.0–10.5)

## 2014-04-25 LAB — LIPASE, BLOOD: LIPASE: 24 U/L (ref 11–59)

## 2014-04-25 MED ORDER — ACETAMINOPHEN 325 MG PO TABS: ORAL_TABLET | ORAL | Status: DC

## 2014-04-25 MED ORDER — OXYCODONE-ACETAMINOPHEN 5-325 MG PO TABS: 1.0000 | ORAL_TABLET | ORAL | Status: DC | PRN

## 2014-04-25 MED ORDER — IBUPROFEN 200 MG PO TABS: ORAL_TABLET | ORAL | Status: DC

## 2014-04-25 NOTE — Progress Notes (Signed)
I have seen and examined the patient and agree with the assessment and plans.  Sandro Burgo A. Karon Cotterill  MD, FACS  

## 2014-04-25 NOTE — Discharge Instructions (Signed)
Laparoscopic Cholecystectomy, Care After °Refer to this sheet in the next few weeks. These instructions provide you with information on caring for yourself after your procedure. Your health care provider may also give you more specific instructions. Your treatment has been planned according to current medical practices, but problems sometimes occur. Call your health care provider if you have any problems or questions after your procedure. °WHAT TO EXPECT AFTER THE PROCEDURE °After your procedure, it is typical to have the following: °· Pain at your incision sites. You will be given pain medicines to control the pain. °· Mild nausea or vomiting. This should improve after the first 24 hours. °· Bloating and possibly shoulder pain from the gas used during the procedure. This will improve after the first 24 hours. °HOME CARE INSTRUCTIONS  °· Change bandages (dressings) as directed by your health care provider. °· Keep the wound dry and clean. You may wash the wound gently with soap and water. Gently blot or dab the area dry. °· Do not take baths or use swimming pools or hot tubs for 2 weeks or until your health care provider approves. °· Only take over-the-counter or prescription medicines as directed by your health care provider. °· Continue your normal diet as directed by your health care provider. °· Do not lift anything heavier than 10 pounds (4.5 kg) until your health care provider approves. °· Do not play contact sports for 1 week or until your health care provider approves. °SEEK MEDICAL CARE IF:  °· You have redness, swelling, or increasing pain in the wound. °· You notice yellowish-white fluid (pus) coming from the wound. °· You have drainage from the wound that lasts longer than 1 day. °· You notice a bad smell coming from the wound or dressing. °· Your surgical cuts (incisions) break open. °SEEK IMMEDIATE MEDICAL CARE IF:  °· You develop a rash. °· You have difficulty breathing. °· You have chest pain. °· You  have a fever. °· You have increasing pain in the shoulders (shoulder strap areas). °· You have dizzy episodes or faint while standing. °· You have severe abdominal pain. °· You feel sick to your stomach (nauseous) or throw up (vomit) and this lasts for more than 1 day. °Document Released: 09/26/2005 Document Revised: 07/17/2013 Document Reviewed: 05/08/2013 °Exit[employer]® Patient Information ©2015 [employer]. This information is not intended to replace advice given to you by your health care provider. Make sure you discuss any questions you have with your health care provider. ° °[facility] ______[facility] °LAPAROSCOPIC SURGERY: POST OP INSTRUCTIONS °Always review your discharge instruction sheet given to you by the facility where your surgery was performed. °IF YOU HAVE DISABILITY OR FAMILY LEAVE FORMS, YOU MUST BRING THEM TO THE OFFICE FOR PROCESSING.   °DO NOT GIVE THEM TO YOUR DOCTOR. ° °1. A prescription for pain medication may be given to you upon discharge.  Take your pain medication as prescribed, if needed.  If narcotic pain medicine is not needed, then you may take acetaminophen (Tylenol) or ibuprofen (Advil) as needed. °2. Take your usually prescribed medications unless otherwise directed. °3. If you need a refill on your pain medication, please contact your pharmacy.  They will contact our office to request authorization. Prescriptions will not be filled after 5pm or on week-ends. °4. You should follow a light diet the first few days after arrival home, such as soup and crackers, etc.  Be sure to include lots of fluids daily. °5. Most patients will experience some   swelling and bruising in the area of the incisions.  Ice packs will help.  Swelling and bruising can take several days to resolve.  °6. It is common to experience some constipation if taking pain medication after surgery.  Increasing fluid intake and taking a stool softener (such as Colace) will usually help or prevent this problem  from occurring.  A mild laxative (Milk of Magnesia or Miralax) should be taken according to package instructions if there are no bowel movements after 48 hours. °7. Unless discharge instructions indicate otherwise, you may remove your bandages 24-48 hours after surgery, and you may shower at that time.  You may have steri-strips (small skin tapes) in place directly over the incision.  These strips should be left on the skin for 7-10 days.  If your surgeon used skin glue on the incision, you may shower in 24 hours.  The glue will flake off over the next 2-3 weeks.  Any sutures or staples will be removed at the office during your follow-up visit. °8. ACTIVITIES:  You may resume regular (light) daily activities beginning the next day--such as daily self-care, walking, climbing stairs--gradually increasing activities as tolerated.  You may have sexual intercourse when it is comfortable.  Refrain from any heavy lifting or straining until approved by your doctor. °a. You may drive when you are no longer taking prescription pain medication, you can comfortably wear a seatbelt, and you can safely maneuver your car and apply brakes. °b. RETURN TO WORK:  __________________________________________________________ °9. You should see your doctor in the office for a follow-up appointment approximately 2-3 weeks after your surgery.  Make sure that you call for this appointment within a day or two after you arrive home to insure a convenient appointment time. °10. OTHER INSTRUCTIONS: __________________________________________________________________________________________________________________________ __________________________________________________________________________________________________________________________ °WHEN TO CALL YOUR DOCTOR: °1. Fever over 101.0 °2. Inability to urinate °3. Continued bleeding from incision. °4. Increased pain, redness, or drainage from the incision. °5. Increasing abdominal pain ° °The  clinic staff is available to answer your questions during regular business hours.  Please don’t hesitate to call and ask to speak to one of the nurses for clinical concerns.  If you have a medical emergency, go to the nearest emergency room or call 911.  A surgeon from [facility] is always on call at the hospital. °[address], [address], [place], [place]  [address] ? [address] [address], [place],    [address] °[phone] ? [phone] ? FAX [phone] °Web site: [email or web address] °

## 2014-04-25 NOTE — Discharge Summary (Signed)
Physician Discharge Summary  Patient ID: Brett Sims MRN: [id number] DOB/AGE: Oct 04, 1946 68 y.o.  Admit date: 04/24/2014 Discharge date: 04/25/2014  Admission Diagnoses:  1. Cholecystitis and cholelithiasis  2. HIV  3. Hx of umbilical hernia repair and partial colectomy  4. 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 Renal insuffiencey (Mild)  6. BPH  7. GERD   Discharge Diagnoses:  Same  Active Problems:   Cholecystitis with cholelithiasis   PROCEDURES: Laparoscopic Cholecystectomy ,04/24/2014, Harl Bowie, MD       Hospital Course: 68 y/o who started having abdominal pain 7/13. He says he woke with significant pain and felt like his stomach was on fire. Pain lasted about 6 hours and then got better. He did well for the next 1.5 days, no problems. Last evening he had barbeque for dinner and then started having pain about 5 PM. He tried Gas ex, and Tums with no success.He again describes it as burning and mid epigastric. He got no relief with anything, including oatmeal. he didn't really have any nausea till later. No vomiting, No fever. He tried to find an Urgent care, but nothing was open so he presented to the ED last PM. The burning pain has resolved again, but he continues to be tender RUQ and some in the RLQ.  Work up in the ED shows he is afebrile, VSS. Creatinine is up , glucose is also up. WBC is normal, LFT's and lipase is normal. Platelets are slightly decreased. A CT scan was ordered first and this shows: Cholelithiasis with stone in the gallbladder neck and subtle pericholecystic edema. If right upper quadrant symptoms, ultrasound could further evaluate. 2. Hepatic steatosis. 3. Mild splenomegaly. Ultrasound shows: There is a 7 mm stone in the gallbladder with sludge as well as gallbladder wall thickening and a positive sonographic Murphy sign. Common bile duct: Diameter: 7.3 mm, slightly prominent for the patient's age but there is no visible  stone. No dilated intrahepatic bile ducts.  He continue to have pain on palpation of the RUQ, and some but less RLQ. We are ask to see. He was seen and taken to the OR later that day.  He has done well and is ready for discharge the following day.  Condition on d/c:  Improved  Follow up;  2 weeks Dr. Ninfa Linden   Disposition: 01-Home or Self Care     Medication List         acetaminophen 325 MG tablet  Commonly known as:  TYLENOL  Do not take more than 4000 mg of Tylenol (acetaminophen) per day.  This is in your prescription pain medicine.     bismuth subsalicylate 932 IZ/12WP suspension  Commonly known as:  PEPTO BISMOL  Take 30 mLs by mouth once.     Clindamycin Phosphate foam  Apply 1 application topically 2 (two) times daily as needed (dermatitis).     elvitegravir-cobicistat-emtricitabine-tenofovir 150-150-200-300 MG Tabs tablet  Commonly known as:  STRIBILD  Take 1 tablet by mouth daily with breakfast.     GAS-X PO  Take 2 tablets by mouth once.     ibuprofen 200 MG tablet  Commonly known as:  MOTRIN IB  You can take 2-3 tablets every 6 hours as needed for pain.     loperamide 2 MG tablet  Commonly known as:  IMODIUM A-D  Take 2 mg by mouth daily.     multivitamin tablet  Take 1 tablet by mouth daily.  oxyCODONE-acetaminophen 5-325 MG per tablet  Commonly known as:  PERCOCET/ROXICET  Take 1-2 tablets by mouth every 4 (four) hours as needed for moderate pain.     RABEprazole 20 MG tablet  Commonly known as:  ACIPHEX  Take 20 mg by mouth daily.     REFRESH TEARS 0.5 % Soln  Generic drug:  carboxymethylcellulose  Place 1 drop into both eyes 3 (three) times daily as needed (dry eyes).     tamsulosin 0.4 MG Caps capsule  Commonly known as:  FLOMAX  Take 0.4 mg by mouth daily.           Follow-up Information   Follow up with [facility] A, MD. Schedule an appointment as soon as possible for a visit in 2 weeks. (Make an appointment for 2-3 weeks.   Call if you have an issue before that.)    Specialty:  General Surgery   Contact information:   [address] [place] [address] [phone]       Follow up with Irven Shelling, MD. (Let him know you have had surgery, and how you are doing .)    Specialty:  Internal Medicine   Contact information:   [address], [address] [place] [address] [phone]       Signed: Earnstine Regal 04/25/2014, 12:13 PM

## 2014-04-25 NOTE — Progress Notes (Signed)
Discharge instructions reviewed with pt and pt's partner. Rx's and medications reviewed, follow up appointments, and care of the surgical site reviewed. Pt verbalizes understanding and asked appropriate questions. Pt ready for discharge.

## 2014-04-25 NOTE — Progress Notes (Signed)
1 Day Post-Op  Subjective: He is sore but doing well this Am.  Sites look fine and he just got a regular breakfast.  Objective: Vital signs in last 24 hours: Temp:  [97.8 F (36.6 C)-99.6 F (37.6 C)] 99.6 F (37.6 C) (07/17 0602) Pulse Rate:  [64-102] 102 (07/17 0602) Resp:  [12-20] 18 (07/17 0602) BP: (133-195)/(77-123) 138/79 mmHg (07/17 0602) SpO2:  [94 %-100 %] 95 % (07/17 0602) Last BM Date: 04/23/14 240 PO TM 99.6, VSS Labs OK Intake/Output from previous day: 07/16 0701 - 07/17 0700 In: 2140 [P.O.:240; I.V.:1900] Out: 600 [Urine:600] Intake/Output this shift:    General appearance: alert, cooperative and no distress GI: sore, still distended, sites look fine  Lab Results:   Recent Labs  04/23/14 2258 04/25/14 0502  WBC 6.6 6.8  HGB 13.9 12.5*  HCT 38.5* 35.1*  PLT 121* 122*    BMET  Recent Labs  04/23/14 2258  NA 140  K 4.4  CL 100  CO2 26  GLUCOSE 159*  BUN 19  CREATININE 1.52*  CALCIUM 9.4   PT/INR No results found for this basename: LABPROT, INR,  in the last 72 hours   Recent Labs Lab 04/23/14 2258  AST 36  ALT 52  ALKPHOS 75  BILITOT 1.0  PROT 7.5  ALBUMIN 4.1     Lipase     Component Value Date/Time   LIPASE 24 04/25/2014 0502     Studies/Results: Ct Abdomen Pelvis Wo Contrast  04/24/2014   CLINICAL DATA:  Abdominal pain  EXAM: CT ABDOMEN AND PELVIS WITHOUT CONTRAST  TECHNIQUE: Multidetector CT imaging of the abdomen and pelvis was performed following the standard protocol without IV contrast.  COMPARISON:  None.  FINDINGS: BODY WALL: Unremarkable.  LOWER CHEST: Mild cardiomegaly. There is likely proximal circumflex coronary artery atherosclerosis. Granulomatous changes to the right lower lobe and lower mediastinal nodes.  ABDOMEN/PELVIS:  Liver: Diffuse, fairly marked fatty infiltration of the liver with occasional sparing.  Biliary: Cholelithiasis with mild pericholecystic haziness. 1 of the stones is at the gallbladder neck.   Pancreas: Unremarkable.  Spleen: Mild splenomegaly with 16 cm length and up to 7 cm thickness. Mild granulomatous changes.  Adrenals: Unremarkable.  Kidneys and ureters: 1 cm presumed cyst in the interpolar right kidney. There is a 1 cm presumed cyst in the interpolar left kidney which appears to have layering milk of calcium.  Bladder: Unremarkable.  Reproductive: Symmetric mild haziness around the seminal vesicles which is likely within normal limits.  Bowel: Distal colonic diverticulosis. Negative appendix.  Retroperitoneum: No mass or adenopathy.  Peritoneum: No ascites or pneumoperitoneum.  Vascular: No acute abnormality.  OSSEOUS: No acute abnormalities.  IMPRESSION: 1. Cholelithiasis with stone in the gallbladder neck and subtle pericholecystic edema. If right upper quadrant symptoms, ultrasound could further evaluate. 2. Hepatic steatosis. 3. Mild splenomegaly.   Electronically Signed   By: Jorje Guild M.D.   On: 04/24/2014 05:00   US Abdomen Complete  04/24/2014   CLINICAL DATA:  Upper abdominal pain.  EXAM: ULTRASOUND ABDOMEN COMPLETE  COMPARISON:  CT scan dated 04/24/2014  FINDINGS: Gallbladder:  There is a 7 mm stone in the gallbladder with sludge as well as gallbladder wall thickening and a positive sonographic Murphy's sign.  Common bile duct:  Diameter: 7.3 mm, slightly prominent for the patient's age but there is no visible stone. No dilated intrahepatic bile ducts.  Liver:  Very dense echogenic liver parenchyma without focal lesions.  IVC:  No abnormality visualized.  Pancreas:  Obscured by bowel.  Spleen:  12.0 cm, normal.  Right Kidney:  Length: 12.7 cm. 2.3 cm cyst in the upper pole. Small dependent internal echoes.  Left Kidney:  Length: 11.7 cm. 1.3 cm and 1.1 cm simple appearing cysts in the mid left kidney.  Abdominal aorta:  Normal.  Other findings:  None.  IMPRESSION: Cholelithiasis and acute cholecystitis. Slightly complex cyst in the upper pole the right kidney, probably  representing a small hemorrhagic cyst.   Electronically Signed   By: Rozetta Nunnery M.D.   On: 04/24/2014 07:33    Medications: . ampicillin-sulbactam (UNASYN) IV  3 g Intravenous Q6H  . elvitegravir-cobicistat-emtricitabine-tenofovir  1 tablet Oral Q breakfast  . pantoprazole (PROTONIX) IV  40 mg Intravenous QHS  . tamsulosin  0.4 mg Oral Daily    Assessment/Plan Calculus of gallbladder with acute cholecystitis, without mention of obstruction,BLACKMAN,DOUGLAS, MD, 04/24/2014.    Plan:  Home after breakfast.       LOS: 1 day    Dewitte Vannice 04/25/2014

## 2014-04-28 ENCOUNTER — Other Ambulatory Visit: Payer: Medicare PPO

## 2014-04-28 DIAGNOSIS — B20 Human immunodeficiency virus [HIV] disease: Secondary | ICD-10-CM

## 2014-04-28 LAB — COMPREHENSIVE METABOLIC PANEL
ALT: 48 U/L (ref 0–53)
AST: 34 U/L (ref 0–37)
Albumin: 3.9 g/dL (ref 3.5–5.2)
Alkaline Phosphatase: 60 U/L (ref 39–117)
BILIRUBIN TOTAL: 0.9 mg/dL (ref 0.2–1.2)
BUN: 15 mg/dL (ref 6–23)
CO2: 25 mEq/L (ref 19–32)
CREATININE: 1.24 mg/dL (ref 0.50–1.35)
Calcium: 8.9 mg/dL (ref 8.4–10.5)
Chloride: 106 mEq/L (ref 96–112)
Glucose, Bld: 158 mg/dL — ABNORMAL HIGH (ref 70–99)
POTASSIUM: 4.2 meq/L (ref 3.5–5.3)
Sodium: 141 mEq/L (ref 135–145)
Total Protein: 6.5 g/dL (ref 6.0–8.3)

## 2014-04-29 LAB — T-HELPER CELL (CD4) - (RCID CLINIC ONLY)
CD4 % Helper T Cell: 28 % — ABNORMAL LOW (ref 33–55)
CD4 T Cell Abs: 170 /uL — ABNORMAL LOW (ref 400–2700)

## 2014-05-03 LAB — HIV-1 RNA QUANT-NO REFLEX-BLD
HIV 1 RNA Quant: <20 copies/mL (ref <20)
HIV-1 RNA Quant, Log: <1.3 {Log} (ref <1.30)

## 2014-05-05 ENCOUNTER — Telehealth (INDEPENDENT_AMBULATORY_CARE_PROVIDER_SITE_OTHER): Payer: Self-pay | Admitting: *Deleted

## 2014-05-05 NOTE — Telephone Encounter (Signed)
Pt called complaining of one of his incisions really red and swollen.  He states that there is no drainage or other discoloration.  He is s/p lap chole 04-25-14.  I advised pt that sometimes the redness is normal but I was concerned with the swelling.  He did advise me that he had been on his feet doing more activity today and that could have caused it.  I scheduled pt to come into [place] office, 05-06-14 to see Dr. Ninfa Linden. I also advised pt that he could put some ice on it this afternoon and if it gets to looking better and the swelling goes down, that he could call back in the morning and cancel his [place] office appt.  Pt verbalized understanding.  Anderson Malta

## 2014-05-06 ENCOUNTER — Encounter (INDEPENDENT_AMBULATORY_CARE_PROVIDER_SITE_OTHER): Payer: BC Managed Care – PPO | Admitting: Surgery

## 2014-05-12 ENCOUNTER — Other Ambulatory Visit: Payer: Self-pay | Admitting: Gastroenterology

## 2014-05-12 ENCOUNTER — Ambulatory Visit: Payer: BC Managed Care – PPO | Admitting: Internal Medicine

## 2014-05-16 ENCOUNTER — Ambulatory Visit (INDEPENDENT_AMBULATORY_CARE_PROVIDER_SITE_OTHER): Payer: Medicare PPO | Admitting: Surgery

## 2014-05-16 ENCOUNTER — Encounter (INDEPENDENT_AMBULATORY_CARE_PROVIDER_SITE_OTHER): Payer: Self-pay | Admitting: Surgery

## 2014-05-16 VITALS — BP 130/78 | HR 73 | Temp 97.5°F | Ht 69.0 in | Wt 185.0 lb

## 2014-05-16 DIAGNOSIS — Z09 Encounter for follow-up examination after completed treatment for conditions other than malignant neoplasm: Secondary | ICD-10-CM

## 2014-05-16 NOTE — Progress Notes (Signed)
Subjective:     Patient ID: Brett Sims, male   DOB: August 10, 1946, 68 y.o.   MRN: [id number]  HPI He is 3 weeks status post laparoscopic cholecystectomy. He has been doing well. He did have one episode of abdominal pain. He reports after this he did have dark urine which may have had a blood tinge to it. Since then he has been totally fine without any abdominal pain or complaints  Review of Systems     Objective:   Physical Exam On exam, his incisions are healing well. His abdomen is soft nontender. The final pathology showed chronic inflammation with gallstones    Assessment:     Patient stable postop     Plan:     He may resume his normal activity. If he has another attack of abdominal pain and discoloration of his urine, I will check a urinalysis and liver function tests. If he notices any blood in his urine, he will contact his primary care physician.

## 2014-05-20 ENCOUNTER — Ambulatory Visit (INDEPENDENT_AMBULATORY_CARE_PROVIDER_SITE_OTHER): Payer: Medicare PPO | Admitting: Internal Medicine

## 2014-05-20 ENCOUNTER — Encounter: Payer: Self-pay | Admitting: Internal Medicine

## 2014-05-20 VITALS — BP 131/81 | HR 74 | Temp 97.8°F | Wt 186.8 lb

## 2014-05-20 DIAGNOSIS — N529 Male erectile dysfunction, unspecified: Secondary | ICD-10-CM | POA: Insufficient documentation

## 2014-05-20 DIAGNOSIS — B20 Human immunodeficiency virus [HIV] disease: Secondary | ICD-10-CM

## 2014-05-20 NOTE — Progress Notes (Signed)
Patient ID: Brett Sims, male   DOB: 10/30/1945, 68 y.o.   MRN: [id number]          Patient Active Problem List   Diagnosis Date Noted  . HIV DISEASE 06/12/2007    Priority: High  . HYPERGLYCEMIA 04/09/2009    Priority: Medium  . HYPERLIPIDEMIA 12/31/2007    Priority: Medium  . Erectile dysfunction 05/20/2014  . Cholecystitis with cholelithiasis 04/24/2014  . GERD 06/12/2007  . BENIGN PROSTATIC HYPERTROPHY 06/12/2007  . PNEUMONIA, HX OF 06/12/2007  . 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  This is in your prescription pain medicine.   BISMUTH SUBSALICYLATE (PEPTO BISMOL) 262 MG/15ML SUSPENSION    Take 30 mLs by mouth once.   CARBOXYMETHYLCELLULOSE (REFRESH TEARS) 0.5 % SOLN    Place 1 drop into both eyes 3 (three) times daily as needed (dry eyes).   CLINDAMYCIN PHOSPHATE FOAM    Apply 1 application topically 2 (two) times daily as needed (dermatitis).    ELVITEGRAVIR-COBICISTAT-EMTRICITABINE-TENOFOVIR (STRIBILD) 150-150-200-300 MG TABS TABLET    Take 1 tablet by mouth daily with breakfast.   IBUPROFEN (MOTRIN IB) 200 MG TABLET    You can take 2-3 tablets every 6 hours as needed for pain.   LOPERAMIDE (IMODIUM A-D) 2 MG TABLET    Take 2 mg by mouth daily.    MULTIPLE VITAMIN (MULTIVITAMIN) TABLET    Take 1 tablet by mouth daily.   RABEPRAZOLE (ACIPHEX) 20 MG TABLET    Take 20 mg by mouth daily.     SIMETHICONE (GAS-X PO)    Take 2 tablets by mouth once.   TAMSULOSIN (FLOMAX) 0.4 MG CAPS CAPSULE    Take 0.4 mg by mouth daily.  Modified Medications   No medications on file  Discontinued Medications   No medications on file    Subjective: Brett Sims is in for his routine visit. He was hospitalized last month with acute cholecystitis and underwent laparoscopic  cholecystectomy. He is feeling much better. He missed a few doses of his Stribild while hospitalized but otherwise has not missed any doses. He had one episode of dark red urine just after he was released from the hospital and wonders if he passed a kidney stone. He did not have any pain. He's had no problems with urination since that time. Review of Systems: Pertinent items are noted in HPI.  Past Medical History  Diagnosis Date  . GERD (gastroesophageal reflux disease)   . HIV disease   . Complication of anesthesia   . PONV (postoperative nausea and vomiting)   . Cholecystolithiasis     History  Substance Use Topics  . Smoking status: Never Smoker   . Smokeless tobacco: Never Used  . Alcohol Use: No    No family history on file.  Allergies  Allergen Reactions  . Codeine Other (See Comments)    Mood changes   . Erythromycin Nausea And Vomiting  . Sulfonamide Derivatives Nausea And Vomiting    Objective: Temp: 97.8 F (36.6 C) (08/11 0940) Temp src: Oral (08/11 0940) BP: 131/81 mmHg (08/11 0940) Pulse Rate: 74 (08/11 0940) Body mass index is 27.57 kg/(m^2).  General: He is in good spirits as usual Oral: No oropharyngeal lesions Skin: No rash Lungs: Clear Cor: Regular S1 and S2 with no  murmurs  Lab Results Lab Results  Component Value Date   WBC 6.8 04/25/2014   HGB 12.5* 04/25/2014   HCT 35.1* 04/25/2014   MCV 89.5 04/25/2014   PLT 122* 04/25/2014    Lab Results  Component Value Date   CREATININE 1.24 04/28/2014   BUN 15 04/28/2014   NA 141 04/28/2014   K 4.2 04/28/2014   CL 106 04/28/2014   CO2 25 04/28/2014    Lab Results  Component Value Date   ALT 48 04/28/2014   AST 34 04/28/2014   ALKPHOS 60 04/28/2014   BILITOT 0.9 04/28/2014    Lab Results  Component Value Date   CHOL 104 11/06/2013   HDL 24* 11/06/2013   LDLCALC 5 11/06/2013   TRIG 373* 11/06/2013   CHOLHDL 4.3 11/06/2013    Lab Results HIV 1 RNA Quant (copies/mL)  Date Value  04/28/2014 <20     11/06/2013 <20   04/23/2013 <20      CD4 T Cell Abs (/uL)  Date Value  04/28/2014 170*  11/06/2013 330*  04/23/2013 320*     Assessment: His absolute CD4 dropped shortly after his recent cholecystitis and surgery but his percentage remains stable at 28%. I strongly suspect this has self corrected and I do not think he needs to be started on pneumocystis prophylaxis. His viral load has been undetectable for over 5 years.  Plan: 1. Continue Stribild 2. Followup after lab work in [place] months   Michel Bickers, MD [facility] [phone] pager   [phone] cell 05/20/2014, 10:01 AM

## 2014-07-29 ENCOUNTER — Encounter (HOSPITAL_COMMUNITY): Payer: Self-pay | Admitting: Pharmacy Technician

## 2014-08-07 ENCOUNTER — Encounter (HOSPITAL_COMMUNITY): Payer: Self-pay | Admitting: *Deleted

## 2014-08-18 ENCOUNTER — Other Ambulatory Visit: Payer: Self-pay | Admitting: Gastroenterology

## 2014-08-19 ENCOUNTER — Encounter (HOSPITAL_COMMUNITY)
Admission: RE | Disposition: A | Discharge status: Home / Self Care | Payer: Self-pay | Source: Ambulatory Visit | Attending: Gastroenterology

## 2014-08-19 ENCOUNTER — Ambulatory Visit (HOSPITAL_COMMUNITY): Payer: Medicare PPO | Admitting: Registered Nurse

## 2014-08-19 ENCOUNTER — Encounter (HOSPITAL_COMMUNITY): Payer: Self-pay | Admitting: *Deleted

## 2014-08-19 ENCOUNTER — Ambulatory Visit (HOSPITAL_COMMUNITY)
Admission: RE | Admit: 2014-08-19 | Discharge: 2014-08-19 | Disposition: A | Discharge status: Home / Self Care | Payer: Medicare PPO | Source: Ambulatory Visit | Attending: Gastroenterology | Admitting: Gastroenterology

## 2014-08-19 DIAGNOSIS — N400 Enlarged prostate without lower urinary tract symptoms: Secondary | ICD-10-CM | POA: Insufficient documentation

## 2014-08-19 DIAGNOSIS — Z1211 Encounter for screening for malignant neoplasm of colon: Secondary | ICD-10-CM | POA: Insufficient documentation

## 2014-08-19 DIAGNOSIS — E538 Deficiency of other specified B group vitamins: Secondary | ICD-10-CM | POA: Diagnosis not present

## 2014-08-19 DIAGNOSIS — Z882 Allergy status to sulfonamides status: Secondary | ICD-10-CM | POA: Insufficient documentation

## 2014-08-19 DIAGNOSIS — Z881 Allergy status to other antibiotic agents status: Secondary | ICD-10-CM | POA: Diagnosis not present

## 2014-08-19 DIAGNOSIS — B20 Human immunodeficiency virus [HIV] disease: Secondary | ICD-10-CM | POA: Diagnosis not present

## 2014-08-19 DIAGNOSIS — Z85820 Personal history of malignant melanoma of skin: Secondary | ICD-10-CM | POA: Diagnosis not present

## 2014-08-19 DIAGNOSIS — Z86010 Personal history of colonic polyps: Secondary | ICD-10-CM | POA: Diagnosis not present

## 2014-08-19 DIAGNOSIS — K219 Gastro-esophageal reflux disease without esophagitis: Secondary | ICD-10-CM | POA: Diagnosis not present

## 2014-08-19 DIAGNOSIS — N189 Chronic kidney disease, unspecified: Secondary | ICD-10-CM | POA: Diagnosis not present

## 2014-08-19 DIAGNOSIS — I129 Hypertensive chronic kidney disease with stage 1 through stage 4 chronic kidney disease, or unspecified chronic kidney disease: Secondary | ICD-10-CM | POA: Insufficient documentation

## 2014-08-19 HISTORY — PX: COLONOSCOPY WITH PROPOFOL: SHX5780

## 2014-08-19 SURGERY — COLONOSCOPY WITH PROPOFOL
Anesthesia: Monitor Anesthesia Care

## 2014-08-19 MED ORDER — PROPOFOL INFUSION 10 MG/ML OPTIME
INTRAVENOUS | Status: DC | PRN
  Administered 2014-08-19: 300 ug/kg/min via INTRAVENOUS

## 2014-08-19 MED ORDER — ONDANSETRON HCL 4 MG/2ML IJ SOLN
INTRAMUSCULAR | Status: DC | PRN
  Administered 2014-08-19: 4 mg via INTRAVENOUS

## 2014-08-19 MED ORDER — PROPOFOL 10 MG/ML IV BOLUS
INTRAVENOUS | Status: AC
  Filled 2014-08-19: qty 20

## 2014-08-19 MED ORDER — LACTATED RINGERS IV SOLN
INTRAVENOUS | Status: DC | PRN
  Administered 2014-08-19: 10:00:00 via INTRAVENOUS

## 2014-08-19 MED ORDER — ONDANSETRON HCL 4 MG/2ML IJ SOLN
INTRAMUSCULAR | Status: AC
  Filled 2014-08-19: qty 2

## 2014-08-19 MED ORDER — SODIUM CHLORIDE 0.9 % IV SOLN: INTRAVENOUS | Status: DC

## 2014-08-19 SURGICAL SUPPLY — 21 items

## 2014-08-19 NOTE — Anesthesia Preprocedure Evaluation (Addendum)
Anesthesia Evaluation  Patient identified by MRN, date of birth, ID band Patient awake    Reviewed: Allergy & Precautions, H&P , NPO status , Patient's Chart, lab work & pertinent test results  Airway Mallampati: II  TM Distance: >3 FB Neck ROM: Full    Dental  (+) Teeth Intact, Dental Advisory Given   Pulmonary neg pulmonary ROS,          Cardiovascular negative cardio ROS      Neuro/Psych negative neurological ROS  negative psych ROS   GI/Hepatic Neg liver ROS, GERD-  ,Hx of colon polyps   Endo/Other  negative endocrine ROS  Renal/GU CRFRenal disease     Musculoskeletal   Abdominal   Peds  Hematology negative hematology ROS (+)   Anesthesia Other Findings +HIV  Reproductive/Obstetrics                            Anesthesia Physical Anesthesia Plan  ASA: II  Anesthesia Plan: MAC   Post-op Pain Management:    Induction: Intravenous  Airway Management Planned: Natural Airway and Simple Face Mask  Additional Equipment:   Intra-op Plan:   Post-operative Plan:   Informed Consent: I have reviewed the patients History and Physical, chart, labs and discussed the procedure including the risks, benefits and alternatives for the proposed anesthesia with the patient or authorized representative who has indicated his/her understanding and acceptance.     Plan Discussed with: CRNA and Surgeon  Anesthesia Plan Comments:         Anesthesia Quick Evaluation

## 2014-08-19 NOTE — H&P (Signed)
  Procedure: Surveillance colonoscopy. Colonoscopy with removal of a 3 mm descending colon adenomatous polyp performed on 04/06/2009. Tubulovillous adenoma removed laparoscopically by transverse colectomy in 2007  History: The patient is a 68 year old male born October 25, 1945. He is scheduled to undergo a surveillance colonoscopy today.  Past medical history: Laparoscopic cholecystectomy performed in July 2015. Cataract surgery. Melanoma skin cancer removed from the back in 2011. Laparoscopic assisted transverse colectomy to remove a tubulovillous adenoma performed in 2007 by Dr. Johnathan Hausen. Herniorrhaphy. Hypertension. Gastroesophageal reflux. Benign prostatic hypertrophy. HIV positive. Vitamin B 12 deficiency.  Medication allergies: Sulfa drugs cause nausea. Erythromycin causes nausea. Ciprofloxacin causes nausea.  Exam: The patient is alert and lying comfortably on the endoscopy stretcher. Abdomen is soft and nontender to palpation. Lungs are clear to auscultation. Cardiac exam reveals a regular rhythm.  Plan: Proceed with surveillance colonoscopy

## 2014-08-19 NOTE — Anesthesia Postprocedure Evaluation (Signed)
  Anesthesia Post-op Note  Patient: Brett Sims  Procedure(s) Performed: Procedure(s): COLONOSCOPY WITH PROPOFOL (N/A)  Patient Location: PACU  Anesthesia Type:MAC  Level of Consciousness: awake, alert  and oriented  Airway and Oxygen Therapy: Patient Spontanous Breathing  Post-op Pain: none  Post-op Assessment: Post-op Vital signs reviewed  Post-op Vital Signs: Reviewed  Last Vitals:  Filed Vitals:   08/19/14 1130  BP: 144/77  Pulse: 61  Temp:   Resp: 15    Complications: No apparent anesthesia complications

## 2014-08-19 NOTE — Op Note (Signed)
Procedure: Surveillance colonoscopy. Colonoscopy with removal of a 3 mm ascending colonadenomatous polyp performed on 04/06/2009. Tubulovillous transverse colon polyp removed laparoscopically in 2007.  Endoscopist: Earle Gell  Premedication: Propofol administered by anesthesia  Procedure: The patient was placed in the left lateral decubitus position. Anal inspection and digital rectal exam were normal. The Pentax pediatric colonoscope was introduced into the rectum; the cecum was reached at 10:45 AM. A normal-appearing appendiceal orifice was identified. A normal-appearing ileocecal valve was intubated and the terminal ileum inspected. Colonic preparation for the exam today was good. The procedure ended at 10:53 AM. Withdrawal time was 8 minutes  Rectum. Normal. Retroflexed view of the distal rectum normal  Sigmoid colon and descending colon. Normal  Splenic flexure. Normal  Transverse colon. Normal  Hepatic flexure. Normal  Ascending colon. Normal  Cecum and ileocecal valve, Normal  Terminal ileum. Normal  Assessment: Normal surveillance colonoscopy post laparoscopic assisted removal of a tubulovillous adenoma from the transverse colon in 2007  Recommendation: Schedule surveillance colonoscopy in 5 years

## 2014-08-19 NOTE — Transfer of Care (Signed)
Immediate Anesthesia Transfer of Care Note  Patient: Brett Sims  Procedure(s) Performed: Procedure(s): COLONOSCOPY WITH PROPOFOL (N/A)  Patient Location: PACU  Anesthesia Type:MAC  Level of Consciousness: awake, alert , oriented and patient cooperative  Airway & Oxygen Therapy: Patient Spontanous Breathing and Patient connected to face mask oxygen  Post-op Assessment: Report given to PACU RN, Post -op Vital signs reviewed and stable and Patient moving all extremities X 4  Post vital signs: stable  Complications: No apparent anesthesia complications

## 2014-08-20 ENCOUNTER — Encounter (HOSPITAL_COMMUNITY): Payer: Self-pay | Admitting: Gastroenterology

## 2014-11-03 ENCOUNTER — Other Ambulatory Visit: Payer: Self-pay | Admitting: Internal Medicine

## 2014-11-25 ENCOUNTER — Other Ambulatory Visit: Payer: Medicare PPO

## 2014-11-25 DIAGNOSIS — Z79899 Other long term (current) drug therapy: Secondary | ICD-10-CM

## 2014-11-25 DIAGNOSIS — B20 Human immunodeficiency virus [HIV] disease: Secondary | ICD-10-CM

## 2014-11-25 LAB — LIPID PANEL
Cholesterol: 98 mg/dL (ref 0–200)
HDL: 30 mg/dL — ABNORMAL LOW (ref >39)
LDL CALC: 9 mg/dL (ref 0–99)
Total CHOL/HDL Ratio: 3.3 Ratio
Triglycerides: 294 mg/dL — ABNORMAL HIGH (ref <150)
VLDL: 59 mg/dL — ABNORMAL HIGH (ref 0–40)

## 2014-11-25 LAB — CBC
HCT: 41.2 % (ref 39.0–52.0)
HEMOGLOBIN: 14.1 g/dL (ref 13.0–17.0)
MCH: 31.2 pg (ref 26.0–34.0)
MCHC: 34.2 g/dL (ref 30.0–36.0)
MCV: 91.2 fL (ref 78.0–100.0)
MPV: 9.8 fL (ref 8.6–12.4)
Platelets: 128 10*3/uL — ABNORMAL LOW (ref 150–400)
RBC: 4.52 MIL/uL (ref 4.22–5.81)
RDW: 15.1 % (ref 11.5–15.5)
WBC: 4.6 10*3/uL (ref 4.0–10.5)

## 2014-11-25 LAB — COMPREHENSIVE METABOLIC PANEL
ALBUMIN: 4 g/dL (ref 3.5–5.2)
ALT: 39 U/L (ref 0–53)
AST: 27 U/L (ref 0–37)
Alkaline Phosphatase: 67 U/L (ref 39–117)
BUN: 18 mg/dL (ref 6–23)
CO2: 29 meq/L (ref 19–32)
CREATININE: 1.3 mg/dL (ref 0.50–1.35)
Calcium: 8.9 mg/dL (ref 8.4–10.5)
Chloride: 102 mEq/L (ref 96–112)
Glucose, Bld: 136 mg/dL — ABNORMAL HIGH (ref 70–99)
Potassium: 4.1 mEq/L (ref 3.5–5.3)
Sodium: 138 mEq/L (ref 135–145)
TOTAL PROTEIN: 6.5 g/dL (ref 6.0–8.3)
Total Bilirubin: 1 mg/dL (ref 0.2–1.2)

## 2014-11-26 LAB — HIV-1 RNA QUANT-NO REFLEX-BLD: HIV-1 RNA Quant, Log: <1.3 {Log} (ref <1.30)

## 2014-11-26 LAB — RPR

## 2014-11-26 LAB — T-HELPER CELL (CD4) - (RCID CLINIC ONLY)
CD4 T CELL HELPER: 28 % — AB (ref 33–55)
CD4 T Cell Abs: 350 /uL — ABNORMAL LOW (ref 400–2700)

## 2014-12-09 ENCOUNTER — Encounter: Payer: Self-pay | Admitting: Internal Medicine

## 2014-12-09 ENCOUNTER — Ambulatory Visit (INDEPENDENT_AMBULATORY_CARE_PROVIDER_SITE_OTHER): Payer: Medicare PPO | Admitting: Internal Medicine

## 2014-12-09 DIAGNOSIS — B20 Human immunodeficiency virus [HIV] disease: Secondary | ICD-10-CM | POA: Diagnosis not present

## 2014-12-09 MED ORDER — ELVITEG-COBIC-EMTRICIT-TENOFAF 150-150-200-10 MG PO TABS: 1.0000 | ORAL_TABLET | Freq: Every day | ORAL | Status: DC

## 2014-12-09 NOTE — Progress Notes (Signed)
Patient ID: Brett Sims, male   DOB: October 31, 1945, 69 y.o.   MRN: [id number]          Patient Active Problem List   Diagnosis Date Noted  . Human immunodeficiency virus (HIV) disease 06/12/2007    Priority: High  . HYPERGLYCEMIA 04/09/2009    Priority: Medium  . HYPERLIPIDEMIA 12/31/2007    Priority: Medium  . Erectile dysfunction 05/20/2014  . Cholecystitis with cholelithiasis 04/24/2014  . GERD 06/12/2007  . BENIGN PROSTATIC HYPERTROPHY 06/12/2007  . PNEUMONIA, HX OF 06/12/2007  . 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  Previous Medications   ACETAMINOPHEN (TYLENOL) 500 MG TABLET    Take 500-1,000 mg by mouth every 8 (eight) hours as needed for mild pain or headache.   CARBOXYMETHYLCELLULOSE (REFRESH TEARS) 0.5 % SOLN    Place 1 drop into both eyes 3 (three) times daily as needed (dry eyes).   CLINDAMYCIN PHOSPHATE FOAM    Apply 1 application topically 2 (two) times daily as needed (dermatitis).    IBUPROFEN (ADVIL,MOTRIN) 800 MG TABLET    Take 800 mg by mouth daily as needed for headache or mild pain.   KETOCONAZOLE (NIZORAL) 2 % CREAM    Apply 1 application topically daily as needed for irritation.   LOPERAMIDE (IMODIUM A-D) 2 MG TABLET    Take 2 mg by mouth every morning.    MULTIPLE VITAMIN (MULTIVITAMIN) TABLET    Take 1 tablet by mouth every morning.    MUPIROCIN OINTMENT (BACTROBAN) 2 %    Place 1 application into the nose daily as needed ("bump").   RABEPRAZOLE (ACIPHEX) 20 MG TABLET    Take 20 mg by mouth every morning.    TAMSULOSIN (FLOMAX) 0.4 MG CAPS CAPSULE    Take 0.4 mg by mouth every morning.   Modified Medications   No medications on file  Discontinued Medications   STRIBILD 150-150-200-300 MG TABS TABLET    TAKE 1 TABLET BY MOUTH ONCE DAILY WITH BREAKFAST     Subjective: [name] is in for his routine visit. He has had no problems taking her tolerating his Stribild. He has noticed some increased numbness and tingling in the bottom of both feet and toes over the last 6 months. He mentions that it is a change in sensation but does not affect his activities. He has recently started on Flomax. He states that he continues to have difficulty obtaining and maintaining an erection despite trying Viagra 50 mg. He states that he used to work but no longer works as well. He also notes some occasional nausea after taking it. He has not tried taking an increased dose. He is looking forward to traveling with his partner, Brett Sims, to the [employer] this summer. Review of Systems: Pertinent items are noted in HPI.  Past Medical History  Diagnosis Date  . GERD (gastroesophageal reflux disease)   . HIV disease   . Complication of anesthesia   . PONV (postoperative nausea and vomiting)   . Cholecystolithiasis     History  Substance Use Topics  . Smoking status: Never Smoker   . Smokeless tobacco: Never Used  . Alcohol Use: Yes     Comment: occassionally    No family history on file.  Allergies  Allergen Reactions  . Codeine Other (See Comments)    Mood changes   . Erythromycin  Nausea And Vomiting  . Sulfonamide Derivatives Nausea And Vomiting    Objective: Temp: 98 F (36.7 C) (03/01 1505) Temp Source: Oral (03/01 1505) BP: 146/89 mmHg (03/01 1505) Pulse Rate: 94 (03/01 1505) Body mass index is 28.97 kg/(m^2).  General: He is smiling and in good spirits as usual Oral: No oropharyngeal lesions Skin: No rash Lungs: Clear Cor: Regular S1 and S2 with no murmurs Abdomen: Soft and nontender Joints and extremities: His feet are warm and well perfused with normal sensation to light touch   Lab Results Lab Results  Component Value Date   WBC 4.6 11/25/2014   HGB 14.1 11/25/2014   HCT 41.2 11/25/2014   MCV 91.2 11/25/2014   PLT 128*  11/25/2014    Lab Results  Component Value Date   CREATININE 1.30 11/25/2014   BUN 18 11/25/2014   NA 138 11/25/2014   K 4.1 11/25/2014   CL 102 11/25/2014   CO2 29 11/25/2014    Lab Results  Component Value Date   ALT 39 11/25/2014   AST 27 11/25/2014   ALKPHOS 67 11/25/2014   BILITOT 1.0 11/25/2014    Lab Results  Component Value Date   CHOL 98 11/25/2014   HDL 30* 11/25/2014   LDLCALC 9 11/25/2014   TRIG 294* 11/25/2014   CHOLHDL 3.3 11/25/2014    Lab Results HIV 1 RNA QUANT (copies/mL)  Date Value  11/25/2014 <20  04/28/2014 <20  11/06/2013 <20   CD4 T CELL ABS (/uL)  Date Value  11/25/2014 350*  04/28/2014 170*  11/06/2013 330*     Assessment: His HIV infection remains under very good control in his transient dip in CD4 count has self corrected. I will change him to the new formulation of Stribild called Genvoya. This will lessen his long-term risk of osteoporosis and renal insufficiency.  There is a potential interaction between Stribild/Genvoya and Flomax with the interaction causing increased Flomax levels. Since he is on relatively low-dose Flomax and is tolerating it well I will continue them together.  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 I would recommend that we simply monitor that at this time.  He has erectile dysfunction only partially responsive to 50 mg of Viagra. I gave him the option of trying a slightly higher dose of 75-100 mg of Viagra to see if it works and to see if he can tolerate it.  Plan: 1. Change Stribild to Genvoya 2. Follow-up after lab work in [place] months   Michel Bickers, MD [facility] for [place] [phone] pager   [phone] cell 12/09/2014, 5:00 PM

## 2014-12-12 ENCOUNTER — Telehealth: Payer: Self-pay | Admitting: *Deleted

## 2014-12-12 NOTE — Telephone Encounter (Signed)
Patient called and advised per conversation with doctor at 12/09/14 visit asked his insurance about Cylindaphil (ED medication) and they will not cover it but he still wants the doctor to send in Rx and he will pay out of pocket. He advised his pharmacy is [facility] on [address]. Advised the patient will get a message to the doctor and have him put the Rx in to his pharmacy.

## 2014-12-15 ENCOUNTER — Other Ambulatory Visit: Payer: Self-pay | Admitting: Internal Medicine

## 2014-12-15 MED ORDER — SILDENAFIL CITRATE 25 MG PO TABS: ORAL_TABLET | ORAL | Status: DC

## 2014-12-15 NOTE — Telephone Encounter (Signed)
Done

## 2015-04-06 DIAGNOSIS — D510 Vitamin B12 deficiency anemia due to intrinsic factor deficiency: Secondary | ICD-10-CM | POA: Diagnosis not present

## 2015-04-28 DIAGNOSIS — M1310 Monoarthritis, not elsewhere classified, unspecified site: Secondary | ICD-10-CM | POA: Diagnosis not present

## 2015-05-09 DIAGNOSIS — M109 Gout, unspecified: Secondary | ICD-10-CM | POA: Diagnosis not present

## 2015-05-09 DIAGNOSIS — Z21 Asymptomatic human immunodeficiency virus [HIV] infection status: Secondary | ICD-10-CM | POA: Diagnosis not present

## 2015-05-09 DIAGNOSIS — B20 Human immunodeficiency virus [HIV] disease: Secondary | ICD-10-CM | POA: Diagnosis not present

## 2015-05-20 DIAGNOSIS — D510 Vitamin B12 deficiency anemia due to intrinsic factor deficiency: Secondary | ICD-10-CM | POA: Diagnosis not present

## 2015-05-20 DIAGNOSIS — M13179 Monoarthritis, not elsewhere classified, unspecified ankle and foot: Secondary | ICD-10-CM | POA: Diagnosis not present

## 2015-05-25 DIAGNOSIS — M13172 Monoarthritis, not elsewhere classified, left ankle and foot: Secondary | ICD-10-CM | POA: Diagnosis not present

## 2015-06-10 ENCOUNTER — Other Ambulatory Visit: Payer: Self-pay

## 2015-06-16 DIAGNOSIS — M109 Gout, unspecified: Secondary | ICD-10-CM | POA: Diagnosis not present

## 2015-06-16 DIAGNOSIS — E538 Deficiency of other specified B group vitamins: Secondary | ICD-10-CM | POA: Diagnosis not present

## 2015-06-22 DIAGNOSIS — D510 Vitamin B12 deficiency anemia due to intrinsic factor deficiency: Secondary | ICD-10-CM | POA: Diagnosis not present

## 2015-06-23 ENCOUNTER — Other Ambulatory Visit: Payer: Medicare PPO

## 2015-06-23 DIAGNOSIS — B20 Human immunodeficiency virus [HIV] disease: Secondary | ICD-10-CM

## 2015-06-23 LAB — COMPREHENSIVE METABOLIC PANEL
ALT: 31 U/L (ref 9–46)
AST: 25 U/L (ref 10–35)
Albumin: 4.6 g/dL (ref 3.6–5.1)
Alkaline Phosphatase: 65 U/L (ref 40–115)
BUN: 22 mg/dL (ref 7–25)
CHLORIDE: 102 mmol/L (ref 98–110)
CO2: 24 mmol/L (ref 20–31)
CREATININE: 1.27 mg/dL — AB (ref 0.70–1.25)
Calcium: 9.8 mg/dL (ref 8.6–10.3)
Glucose, Bld: 224 mg/dL — ABNORMAL HIGH (ref 65–99)
Potassium: 5 mmol/L (ref 3.5–5.3)
SODIUM: 139 mmol/L (ref 135–146)
Total Bilirubin: 1 mg/dL (ref 0.2–1.2)
Total Protein: 7 g/dL (ref 6.1–8.1)

## 2015-06-23 LAB — CBC
HCT: 39.5 % (ref 39.0–52.0)
Hemoglobin: 13.3 g/dL (ref 13.0–17.0)
MCH: 30.4 pg (ref 26.0–34.0)
MCHC: 33.7 g/dL (ref 30.0–36.0)
MCV: 90.2 fL (ref 78.0–100.0)
MPV: 9.6 fL (ref 8.6–12.4)
PLATELETS: 187 10*3/uL (ref 150–400)
RBC: 4.38 MIL/uL (ref 4.22–5.81)
RDW: 16.6 % — ABNORMAL HIGH (ref 11.5–15.5)
WBC: 10.5 10*3/uL (ref 4.0–10.5)

## 2015-06-24 LAB — T-HELPER CELL (CD4) - (RCID CLINIC ONLY)
CD4 T CELL ABS: 150 /uL — AB (ref 400–2700)
CD4 T CELL HELPER: 24 % — AB (ref 33–55)

## 2015-06-25 LAB — HIV-1 RNA QUANT-NO REFLEX-BLD
HIV 1 RNA Quant: <20 copies/mL (ref <20)
HIV-1 RNA Quant, Log: <1.3 {Log} (ref <1.30)

## 2015-07-07 ENCOUNTER — Encounter: Payer: Self-pay | Admitting: Internal Medicine

## 2015-07-07 ENCOUNTER — Ambulatory Visit (INDEPENDENT_AMBULATORY_CARE_PROVIDER_SITE_OTHER): Payer: Medicare PPO | Admitting: Internal Medicine

## 2015-07-07 VITALS — BP 138/80 | HR 76 | Temp 98.4°F | Wt 188.2 lb

## 2015-07-07 DIAGNOSIS — Z23 Encounter for immunization: Secondary | ICD-10-CM

## 2015-07-07 DIAGNOSIS — M1000 Idiopathic gout, unspecified site: Secondary | ICD-10-CM | POA: Diagnosis not present

## 2015-07-07 DIAGNOSIS — B20 Human immunodeficiency virus [HIV] disease: Secondary | ICD-10-CM | POA: Diagnosis not present

## 2015-07-07 DIAGNOSIS — M109 Gout, unspecified: Secondary | ICD-10-CM | POA: Insufficient documentation

## 2015-07-07 NOTE — Progress Notes (Signed)
Patient ID: Brett Sims, male   DOB: 05/07/1946, 69 y.o.   MRN: [id number]          Patient Active Problem List   Diagnosis Date Noted  . Human immunodeficiency virus (HIV) disease 06/12/2007    Priority: High  . HYPERGLYCEMIA 04/09/2009    Priority: Medium  . HYPERLIPIDEMIA 12/31/2007    Priority: Medium  . Gout 07/07/2015  . Erectile dysfunction 05/20/2014  . Cholecystitis with cholelithiasis 04/24/2014  . GERD 06/12/2007  . BENIGN PROSTATIC HYPERTROPHY 06/12/2007  . PNEUMONIA, HX OF 06/12/2007  . 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   ALLOPURINOL (ZYLOPRIM) 300 MG TABLET    Take 300 mg by mouth daily.   B COMPLEX VITAMINS PO    Take by mouth.   CARBOXYMETHYLCELLULOSE (REFRESH TEARS) 0.5 % SOLN    Place 1 drop into both eyes 3 (three) times daily as needed (dry eyes).   CLINDAMYCIN PHOSPHATE FOAM    Apply 1 application topically 2 (two) times daily as needed (dermatitis).    COLCHICINE 0.6 MG TABLET    Take 0.6 mg by mouth daily.   ELVITEGRAVIR-COBICISTAT-EMTRICITABINE-TENOFOVIR (GENVOYA) 150-150-200-10 MG TABS TABLET    Take 1 tablet by mouth daily with breakfast.   IBUPROFEN (ADVIL,MOTRIN) 800 MG TABLET    Take 800 mg by mouth daily as needed for headache or mild pain.   KETOCONAZOLE (NIZORAL) 2 % CREAM    Apply 1 application topically daily as needed for irritation.   LOPERAMIDE (IMODIUM A-D) 2 MG TABLET    Take 2 mg by mouth every morning.    MULTIPLE VITAMIN (MULTIVITAMIN) TABLET    Take 1 tablet by mouth every morning.    MUPIROCIN OINTMENT (BACTROBAN) 2 %    Place 1 application into the nose daily as needed ("bump").   RABEPRAZOLE (ACIPHEX) 20 MG TABLET    Take 20 mg by mouth every morning.    SILDENAFIL (VIAGRA) 25 MG TABLET    Take 1-2  tablets by mouth as needed   TAMSULOSIN (FLOMAX) 0.4 MG CAPS CAPSULE    Take 0.4 mg by mouth every morning.   Modified Medications   No medications on file  Discontinued Medications   No medications on file    Subjective: Brett Sims is in for his routine HIV follow-up visit. As usual he never misses a single dose of Genvoya. He has no problems tolerating it. He is enjoying retirement. He does partner recently went [place]. However, he has developed acute gout on 3 occasions recently. He is now taking allopurinol and colchicine. He is not having much problem with his acid indigestion.   Review of Systems: Pertinent items are noted in HPI.  Past Medical History  Diagnosis Date  . GERD (gastroesophageal reflux disease)   . HIV disease   . Complication of anesthesia   . PONV (postoperative nausea and vomiting)   . Cholecystolithiasis     Social History  Substance Use Topics  . Smoking status: Never Smoker   . Smokeless tobacco: Never Used  . Alcohol Use: Yes     Comment: occassionally    No family history on file.  Allergies  Allergen Reactions  . Codeine Other (See Comments)  Mood changes   . Erythromycin Nausea And Vomiting  . Sulfonamide Derivatives Nausea And Vomiting    Objective:  Filed Vitals:   07/07/15 1030  BP: 138/80  Pulse: 76  Temp: 98.4 F (36.9 C)  TempSrc: Oral  Weight: 188 lb 4 oz (85.39 kg)   Body mass index is 28.63 kg/(m^2).  General: He is smiling and in good spirits as usual Oral: No oropharyngeal lesions Skin: No rash Lungs: Clear Cor: Regular S1 and S2 with no murmurs   Lab Results Lab Results  Component Value Date   WBC 10.5 06/23/2015   HGB 13.3 06/23/2015   HCT 39.5 06/23/2015   MCV 90.2 06/23/2015   PLT 187 06/23/2015    Lab Results  Component Value Date   CREATININE 1.27* 06/23/2015   BUN 22 06/23/2015   NA 139 06/23/2015   K 5.0 06/23/2015   CL 102 06/23/2015   CO2 24 06/23/2015    Lab  Results  Component Value Date   ALT 31 06/23/2015   AST 25 06/23/2015   ALKPHOS 65 06/23/2015   BILITOT 1.0 06/23/2015    Lab Results  Component Value Date   CHOL 98 11/25/2014   HDL 30* 11/25/2014   LDLCALC 9 11/25/2014   TRIG 294* 11/25/2014   CHOLHDL 3.3 11/25/2014    Lab Results HIV 1 RNA QUANT (copies/mL)  Date Value  06/23/2015 <20  11/25/2014 <20  04/28/2014 <20   CD4 T CELL ABS (/uL)  Date Value  06/23/2015 150*  11/25/2014 350*  04/28/2014 170*     Problem List Items Addressed This Visit      High   Human immunodeficiency virus (HIV) disease    His HIV infection remains under control with an undetectable viral load. However 2 out of the last 3 of his CD4 counts have been less than 200 although he has had relatively little variation in his percent CD4 count. Although his CD4 count is less than 200 his risk for opportunistic infection is extremely low given that he has an undetectable viral load. I will not start pneumocystis prophylaxis at this time. I will wait and see what his lab work shows before his visit in 6 months. He will continue Genvoya.      Relevant Orders   T-helper cell (CD4)- ([facility] clinic only)   HIV 1 RNA quant-no reflex-bld   CBC   Comprehensive metabolic panel   Lipid panel   RPR     Unprioritized   Gout - Primary        Michel Bickers, MD [place] for Infectious [place] [phone] pager   [phone] cell 07/07/2015, 11:14 AM

## 2015-07-07 NOTE — Assessment & Plan Note (Signed)
His HIV infection remains under control with an undetectable viral load. However 2 out of the last 3 of his CD4 counts have been less than 200 although he has had relatively little variation in his percent CD4 count. Although his CD4 count is less than 200 his risk for opportunistic infection is extremely low given that he has an undetectable viral load. I will not start pneumocystis prophylaxis at this time. I will wait and see what his lab work shows before his visit in 6 months. He will continue Genvoya.

## 2015-07-23 DIAGNOSIS — E538 Deficiency of other specified B group vitamins: Secondary | ICD-10-CM | POA: Diagnosis not present

## 2015-08-24 DIAGNOSIS — D510 Vitamin B12 deficiency anemia due to intrinsic factor deficiency: Secondary | ICD-10-CM | POA: Diagnosis not present

## 2015-09-24 DIAGNOSIS — D81818 Other biotin-dependent carboxylase deficiency: Secondary | ICD-10-CM | POA: Diagnosis not present

## 2015-10-07 ENCOUNTER — Other Ambulatory Visit: Payer: Self-pay | Admitting: *Deleted

## 2015-10-07 ENCOUNTER — Other Ambulatory Visit: Payer: Self-pay | Admitting: Internal Medicine

## 2015-10-26 DIAGNOSIS — E538 Deficiency of other specified B group vitamins: Secondary | ICD-10-CM | POA: Diagnosis not present

## 2015-10-26 DIAGNOSIS — D510 Vitamin B12 deficiency anemia due to intrinsic factor deficiency: Secondary | ICD-10-CM | POA: Diagnosis not present

## 2015-11-27 DIAGNOSIS — D510 Vitamin B12 deficiency anemia due to intrinsic factor deficiency: Secondary | ICD-10-CM | POA: Diagnosis not present

## 2015-11-30 DIAGNOSIS — Z961 Presence of intraocular lens: Secondary | ICD-10-CM | POA: Diagnosis not present

## 2015-12-04 DIAGNOSIS — J189 Pneumonia, unspecified organism: Secondary | ICD-10-CM | POA: Diagnosis not present

## 2015-12-15 ENCOUNTER — Other Ambulatory Visit: Payer: Self-pay | Admitting: Internal Medicine

## 2015-12-15 DIAGNOSIS — B20 Human immunodeficiency virus [HIV] disease: Secondary | ICD-10-CM

## 2015-12-22 ENCOUNTER — Other Ambulatory Visit: Payer: Medicare PPO

## 2015-12-22 DIAGNOSIS — B20 Human immunodeficiency virus [HIV] disease: Secondary | ICD-10-CM

## 2015-12-22 DIAGNOSIS — Z79899 Other long term (current) drug therapy: Secondary | ICD-10-CM | POA: Diagnosis not present

## 2015-12-22 LAB — LIPID PANEL
CHOL/HDL RATIO: 4.2 ratio (ref <=5.0)
Cholesterol: 133 mg/dL (ref 125–200)
HDL: 32 mg/dL — ABNORMAL LOW (ref >=40)
LDL CALC: 28 mg/dL (ref <130)
Triglycerides: 367 mg/dL — ABNORMAL HIGH (ref <150)
VLDL: 73 mg/dL — ABNORMAL HIGH (ref <30)

## 2015-12-22 LAB — COMPREHENSIVE METABOLIC PANEL
ALT: 61 U/L — ABNORMAL HIGH (ref 9–46)
AST: 40 U/L — AB (ref 10–35)
Albumin: 4.4 g/dL (ref 3.6–5.1)
Alkaline Phosphatase: 69 U/L (ref 40–115)
BUN: 12 mg/dL (ref 7–25)
CALCIUM: 9.7 mg/dL (ref 8.6–10.3)
CO2: 31 mmol/L (ref 20–31)
Chloride: 102 mmol/L (ref 98–110)
Creat: 1.19 mg/dL (ref 0.70–1.25)
GLUCOSE: 98 mg/dL (ref 65–99)
POTASSIUM: 4.3 mmol/L (ref 3.5–5.3)
Sodium: 138 mmol/L (ref 135–146)
Total Bilirubin: 1.1 mg/dL (ref 0.2–1.2)
Total Protein: 6.8 g/dL (ref 6.1–8.1)

## 2015-12-22 LAB — CBC
HEMATOCRIT: 40.1 % (ref 39.0–52.0)
Hemoglobin: 13.6 g/dL (ref 13.0–17.0)
MCH: 30.8 pg (ref 26.0–34.0)
MCHC: 33.9 g/dL (ref 30.0–36.0)
MCV: 90.9 fL (ref 78.0–100.0)
MPV: 9.9 fL (ref 8.6–12.4)
PLATELETS: 134 10*3/uL — AB (ref 150–400)
RBC: 4.41 MIL/uL (ref 4.22–5.81)
RDW: 15.5 % (ref 11.5–15.5)
WBC: 5.1 10*3/uL (ref 4.0–10.5)

## 2015-12-23 DIAGNOSIS — B20 Human immunodeficiency virus [HIV] disease: Secondary | ICD-10-CM | POA: Diagnosis not present

## 2015-12-23 LAB — T-HELPER CELL (CD4) - (RCID CLINIC ONLY)
CD4 % Helper T Cell: 30 % — ABNORMAL LOW (ref 33–55)
CD4 T Cell Abs: 370 /uL — ABNORMAL LOW (ref 400–2700)

## 2015-12-23 LAB — RPR

## 2015-12-24 LAB — HIV-1 RNA QUANT-NO REFLEX-BLD

## 2015-12-29 DIAGNOSIS — D510 Vitamin B12 deficiency anemia due to intrinsic factor deficiency: Secondary | ICD-10-CM | POA: Diagnosis not present

## 2015-12-29 DIAGNOSIS — E538 Deficiency of other specified B group vitamins: Secondary | ICD-10-CM | POA: Diagnosis not present

## 2016-01-05 ENCOUNTER — Ambulatory Visit (INDEPENDENT_AMBULATORY_CARE_PROVIDER_SITE_OTHER): Payer: Medicare PPO | Admitting: Internal Medicine

## 2016-01-05 ENCOUNTER — Encounter: Payer: Self-pay | Admitting: Internal Medicine

## 2016-01-05 VITALS — BP 137/96 | HR 98 | Temp 98.1°F | Ht 70.0 in | Wt 184.5 lb

## 2016-01-05 DIAGNOSIS — B20 Human immunodeficiency virus [HIV] disease: Secondary | ICD-10-CM

## 2016-01-05 DIAGNOSIS — K219 Gastro-esophageal reflux disease without esophagitis: Secondary | ICD-10-CM | POA: Diagnosis not present

## 2016-01-05 DIAGNOSIS — G629 Polyneuropathy, unspecified: Secondary | ICD-10-CM | POA: Insufficient documentation

## 2016-01-05 NOTE — Assessment & Plan Note (Signed)
His acid reflux and nausea improved after he switched his antiretroviral regimen to Genvoya. I suggested that he consider a trial off of AcipHex to see if he really needs chronic therapy.

## 2016-01-05 NOTE — Progress Notes (Signed)
Patient Active Problem List   Diagnosis Date Noted  . Human immunodeficiency virus (HIV) disease ([place]) 06/12/2007    Priority: High  . HYPERGLYCEMIA 04/09/2009    Priority: Medium  . HYPERLIPIDEMIA 12/31/2007    Priority: Medium  . Peripheral neuropathy ([place]) 01/05/2016  . Gout 07/07/2015  . Erectile dysfunction 05/20/2014  . Cholecystitis with cholelithiasis 04/24/2014  . GERD 06/12/2007  . BENIGN PROSTATIC HYPERTROPHY 06/12/2007  . PNEUMONIA, HX OF 06/12/2007  . 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   ALLOPURINOL (ZYLOPRIM) 300 MG TABLET    Take 300 mg by mouth daily.   B COMPLEX VITAMINS PO    Take by mouth.   CARBOXYMETHYLCELLULOSE (REFRESH TEARS) 0.5 % SOLN    Place 1 drop into both eyes 3 (three) times daily as needed (dry eyes).   CLINDAMYCIN PHOSPHATE FOAM    Apply 1 application topically 2 (two) times daily as needed (dermatitis).    COLCHICINE 0.6 MG TABLET    Take 0.6 mg by mouth daily.   GENVOYA 150-150-200-10 MG TABS TABLET    TAKE 1 TABLET BY MOUTH ONCE DAILY WITH BREAKFAST   IBUPROFEN (ADVIL,MOTRIN) 800 MG TABLET    Take 800 mg by mouth daily as needed for headache or mild pain.   KETOCONAZOLE (NIZORAL) 2 % CREAM    Apply 1 application topically daily as needed for irritation.   LOPERAMIDE (IMODIUM A-D) 2 MG TABLET    Take 2 mg by mouth every morning.    MULTIPLE VITAMIN (MULTIVITAMIN) TABLET    Take 1 tablet by mouth every morning.    MUPIROCIN OINTMENT (BACTROBAN) 2 %    Place 1 application into the nose daily as needed ("bump").   RABEPRAZOLE (ACIPHEX) 20 MG TABLET    Take 20 mg by mouth every morning.    SILDENAFIL (VIAGRA) 25 MG TABLET    Take 1-2 tablets by mouth as needed   TAMSULOSIN (FLOMAX) 0.4 MG CAPS CAPSULE     Take 0.4 mg by mouth every morning.   Modified Medications   No medications on file  Discontinued Medications   No medications on file    Subjective: Brett Sims is in for his routine HIV follow-up visit. He is accompanied by his husband, [name], today. He tells me that he was treated for walking pneumonia in February. He was given levofloxacin and albuterol and improved fairly promptly over the next 7-10 days. He has continued to be bothered by some folliculitis in the distribution of his beard and scalp. He is using clindamycin foam and topical mupirocin as needed. This is aggravated by shaving. He also has some neuropathic pain in the bottom of his feet. The pain is most noticeable when he lays down to sleep at night. He will occasionally take ibuprofen which does provide relief. He has been worried about his blood sugars being elevated and has been trying to alter his diet. He continues to take his AcipHex but has not had any problems with acid reflux or nausea recently.  Review of Systems: Review of Systems  Constitutional: Negative for fever, chills, weight loss, malaise/fatigue and diaphoresis.  HENT: Negative for sore throat.   Respiratory: Negative for cough, sputum production and shortness of  breath.   Cardiovascular: Negative for chest pain.  Gastrointestinal: Negative for heartburn, nausea, vomiting, abdominal pain and diarrhea.  Genitourinary: Negative for dysuria and frequency.  Musculoskeletal: Negative for myalgias and joint pain.  Skin: Positive for rash.  Neurological: Positive for sensory change. Negative for dizziness and headaches.  Psychiatric/Behavioral: Negative for depression and substance abuse. The patient is not nervous/anxious.     Past Medical History  Diagnosis Date  . GERD (gastroesophageal reflux disease)   . HIV disease ([place])   . Complication of anesthesia   . PONV (postoperative nausea and vomiting)   . Cholecystolithiasis     Social History    Substance Use Topics  . Smoking status: Never Smoker   . Smokeless tobacco: Never Used  . Alcohol Use: Yes     Comment: occassionally    No family history on file.  Allergies  Allergen Reactions  . Codeine Other (See Comments)    Mood changes   . Erythromycin Nausea And Vomiting  . Sulfonamide Derivatives Nausea And Vomiting    Objective:  Filed Vitals:   01/05/16 1002  BP: 137/96  Pulse: 98  Temp: 98.1 F (36.7 C)  TempSrc: Oral  Height: 5\' 10"  (1.778 m)  Weight: 184 lb 8 oz (83.689 kg)   Body mass index is 26.47 kg/(m^2).  Physical Exam  Constitutional: He is oriented to person, place, and time.  He is smiling and in good spirits today. He was worried that he has been losing weight but his recorded weights here have all been very stable over the past several years.  HENT:  Mouth/Throat: No oropharyngeal exudate.  Eyes: Conjunctivae are normal.  Cardiovascular: Normal rate and regular rhythm.   No murmur heard. Pulmonary/Chest: Breath sounds normal.  Abdominal: Soft. He exhibits no mass. There is no tenderness.  Musculoskeletal: Normal range of motion.  His feet are warm and well perfused with good pulses. Light touch is intact.  Neurological: He is alert and oriented to person, place, and time. Gait normal.  Skin: No rash noted.  He does have some very mild pustular folliculitis on the left side of his neck. He has some scabbed areas in his scalp.  Psychiatric: Mood and affect normal.    Lab Results Lab Results  Component Value Date   WBC 5.1 12/22/2015   HGB 13.6 12/22/2015   HCT 40.1 12/22/2015   MCV 90.9 12/22/2015   PLT 134* 12/22/2015    Lab Results  Component Value Date   CREATININE 1.19 12/22/2015   BUN 12 12/22/2015   NA 138 12/22/2015   K 4.3 12/22/2015   CL 102 12/22/2015   CO2 31 12/22/2015    Lab Results  Component Value Date   ALT 61* 12/22/2015   AST 40* 12/22/2015   ALKPHOS 69 12/22/2015   BILITOT 1.1 12/22/2015    Lab  Results  Component Value Date   CHOL 133 12/22/2015   HDL 32* 12/22/2015   LDLCALC 28 12/22/2015   TRIG 367* 12/22/2015   CHOLHDL 4.2 12/22/2015    Lab Results HIV 1 RNA QUANT (copies/mL)  Date Value  12/22/2015 <20  06/23/2015 <20  11/25/2014 <20   CD4 T CELL ABS (/uL)  Date Value  12/22/2015 370*  06/23/2015 150*  11/25/2014 350*      Problem List Items Addressed This Visit      High   Human immunodeficiency virus (HIV) disease ([place])    He has had a fair amount of variability in his CD4 counts  over the past few years but his viral suppression has been excellent. He will continue Genvoya and follow-up after lab work in 6 months.      Relevant Orders   T-helper cell (CD4)- ([facility] clinic only)   HIV 1 RNA quant-no reflex-bld   CBC   Comprehensive metabolic panel     Unprioritized   GERD - Primary    His acid reflux and nausea improved after he switched his antiretroviral regimen to Genvoya. I suggested that he consider a trial off of AcipHex to see if he really needs chronic therapy.           Michel Bickers, MD [facility] for Infect[facility] [place] [facility] [phone] pager   [phone] cell 01/05/2016, 1:18 PM

## 2016-01-05 NOTE — Assessment & Plan Note (Signed)
He has had a fair amount of variability in his CD4 counts over the past few years but his viral suppression has been excellent. He will continue Genvoya and follow-up after lab work in 6 months.

## 2016-01-05 NOTE — Assessment & Plan Note (Signed)
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 He does not feel like his discomfort is severe enough to warrant ongoing daily therapy with medication. He will continue to use ibuprofen as needed and let me know if the pain becomes more severe or persistent.

## 2016-01-20 DIAGNOSIS — Z85820 Personal history of malignant melanoma of skin: Secondary | ICD-10-CM | POA: Diagnosis not present

## 2016-01-20 DIAGNOSIS — Z08 Encounter for follow-up examination after completed treatment for malignant neoplasm: Secondary | ICD-10-CM | POA: Diagnosis not present

## 2016-01-20 DIAGNOSIS — Z1283 Encounter for screening for malignant neoplasm of skin: Secondary | ICD-10-CM | POA: Diagnosis not present

## 2016-02-01 DIAGNOSIS — D510 Vitamin B12 deficiency anemia due to intrinsic factor deficiency: Secondary | ICD-10-CM | POA: Diagnosis not present

## 2016-03-04 DIAGNOSIS — E538 Deficiency of other specified B group vitamins: Secondary | ICD-10-CM | POA: Diagnosis not present

## 2016-03-11 DIAGNOSIS — I10 Essential (primary) hypertension: Secondary | ICD-10-CM | POA: Diagnosis not present

## 2016-03-11 DIAGNOSIS — M79672 Pain in left foot: Secondary | ICD-10-CM | POA: Diagnosis not present

## 2016-03-11 DIAGNOSIS — Z0000 Encounter for general adult medical examination without abnormal findings: Secondary | ICD-10-CM | POA: Diagnosis not present

## 2016-03-11 DIAGNOSIS — K219 Gastro-esophageal reflux disease without esophagitis: Secondary | ICD-10-CM | POA: Diagnosis not present

## 2016-03-11 DIAGNOSIS — M79671 Pain in right foot: Secondary | ICD-10-CM | POA: Diagnosis not present

## 2016-03-11 DIAGNOSIS — N400 Enlarged prostate without lower urinary tract symptoms: Secondary | ICD-10-CM | POA: Diagnosis not present

## 2016-03-11 DIAGNOSIS — Z1389 Encounter for screening for other disorder: Secondary | ICD-10-CM | POA: Diagnosis not present

## 2016-03-11 DIAGNOSIS — M109 Gout, unspecified: Secondary | ICD-10-CM | POA: Diagnosis not present

## 2016-04-05 DIAGNOSIS — D510 Vitamin B12 deficiency anemia due to intrinsic factor deficiency: Secondary | ICD-10-CM | POA: Diagnosis not present

## 2016-05-06 DIAGNOSIS — D510 Vitamin B12 deficiency anemia due to intrinsic factor deficiency: Secondary | ICD-10-CM | POA: Diagnosis not present

## 2016-05-09 ENCOUNTER — Other Ambulatory Visit: Payer: Self-pay | Admitting: *Deleted

## 2016-05-09 DIAGNOSIS — R200 Anesthesia of skin: Secondary | ICD-10-CM

## 2016-05-10 ENCOUNTER — Ambulatory Visit (INDEPENDENT_AMBULATORY_CARE_PROVIDER_SITE_OTHER): Payer: Medicare PPO | Admitting: Neurology

## 2016-05-10 DIAGNOSIS — R200 Anesthesia of skin: Secondary | ICD-10-CM

## 2016-05-10 DIAGNOSIS — G63 Polyneuropathy in diseases classified elsewhere: Principal | ICD-10-CM

## 2016-05-10 DIAGNOSIS — B20 Human immunodeficiency virus [HIV] disease: Secondary | ICD-10-CM

## 2016-05-10 NOTE — Procedures (Signed)
[facility]  [address], [address]  [place], [place] [address] Tel: [phone] Fax:  [phone] Test Date:  05/10/2016  Patient: Brett Sims DOB: March 27, 1946 Physician: Narda Amber, DO  Sex: Male Height: 5\' 8"  Ref Phys: Delanna Ahmadi, M.D.  ID#: [id number] Temp: 33.2C Technician: Jerilynn Mages. Dean   Patient Complaints: This is a 70 year old gentleman referred for evaluation of bilateral feet numbness.  NCV & EMG Findings: Extensive electrodiagnostic testing of the left lower extremity and additional studies of the right shows: 1. Bilateral sural and superficial peroneal sensory responses are absent. 2. Left peroneal motor response (EDB) is absent. Right peroneal motor response (EDB) is markedly reduced. Bilateral peroneal motor responses recording at the tibialis anterior is within normal limits. Left tibial motor response also shows reduced amplitude and conduction velocity slowing. Right tibial motor responses within normal limits with mild conduction velocity slowing. 3. Bilateral tibial H reflex studies are within normal limits. 4. Sparse chronic motor axon loss changes are seen affecting bilateral flexor digitorum longus muscles, without accompanied active denervation.  Impression: 1. 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 2. There is no evidence of a superimposed lumbosacral radiculopathy.   ___________________________ Narda Amber, DO    Nerve Conduction Studies Anti Sensory Summary Table   Site NR Peak (ms) Norm Peak (ms) P-T Amp (V) Norm P-T Amp  Left Sup Peroneal Anti Sensory (Ant Lat Mall)  12 cm NR  <4.6  >3  Right Sup Peroneal Anti Sensory (Ant Lat Mall)  12 cm NR  <4.6  >3  Left Sural Anti Sensory (Lat Mall)  Calf NR  <4.6  >3  Right Sural Anti Sensory (Lat Mall)  Calf NR  <4.6  >3   Motor Summary Table   Site NR Onset (ms) Norm Onset (ms) O-P Amp (mV) Norm O-P Amp Site1 Site2 Delta-0 (ms) Dist (cm) Vel (m/s) Norm Vel (m/s)  Left Peroneal Motor (Ext Dig Brev)  Ankle NR  <6.0  >2.5 B Fib Ankle  0.0  >40  B Fib NR     Poplt B Fib  0.0  >40  Poplt NR            Right Peroneal Motor (Ext Dig Brev)  Ankle    4.2 <6.0 1.3 >2.5 B Fib Ankle 8.0 32.0 40 >40  B Fib    12.2  1.3  Poplt B Fib 2.4 10.0 42 >40  Poplt    14.6  1.3         Left Peroneal TA Motor (Tib Ant)  Fib Head    2.4 <4.5 4.9 >3 Poplit Fib Head 2.2 10.0 45 >40  Poplit    4.6  4.3         Right Peroneal TA Motor (Tib Ant)  Fib Head    3.0 <4.5 5.1 >3 Poplit Fib Head 1.7 10.0 59 >40  Poplit    4.7  5.1         Left Tibial Motor (Abd Hall Brev)  Ankle    3.5 <6.0 3.0 >4 Knee Ankle 11.7 39.0 33 >40  Knee    15.2  2.0         Right Tibial Motor (Abd Hall Brev)  Ankle    3.2 <6.0 4.2 >4 Knee Ankle 10.3 40.0 39 >40  Knee    13.5  2.6          H  Reflex Studies   NR H-Lat (ms) Lat Norm (ms) L-R H-Lat (ms) M-Lat (ms) HLat-MLat (ms)  Left Tibial (Gastroc)     34.83 <35 0.00 5.03 29.80  Right Tibial (Gastroc)     34.83 <35 0.00 4.49 30.34   EMG   Side Muscle Ins Act Fibs Psw Fasc Number Recrt Dur Dur. Amp Amp. Poly Poly. Comment  Left AntTibialis Nml Nml Nml Nml Nml Nml Nml Nml Nml Nml Nml Nml N/A  Left Gastroc Nml Nml Nml Nml Nml Nml Nml Nml Nml Nml Nml Nml N/A  Left Flex Dig Long Nml Nml Nml Nml 1- Rapid Some 1+ Some 1+ Nml Nml N/A  Left RectFemoris Nml Nml Nml Nml Nml Nml Nml Nml Nml Nml Nml Nml N/A  Left GluteusMed Nml Nml Nml Nml Nml Nml Nml Nml Nml Nml Nml Nml N/A  Left BicepsFemS Nml Nml Nml Nml Nml Nml Nml Nml Nml Nml Nml Nml N/A  Right AntTibialis Nml Nml Nml Nml Nml Nml Nml Nml Nml Nml Nml Nml N/A  Right Gastroc Nml Nml Nml Nml Nml Nml Nml Nml Nml Nml Nml Nml N/A  Right Flex Dig Long Nml Nml Nml Nml 1- Rapid Some 1+ Some 1+ Nml Nml N/A      Waveforms:

## 2016-05-17 DIAGNOSIS — M109 Gout, unspecified: Secondary | ICD-10-CM | POA: Diagnosis not present

## 2016-06-07 DIAGNOSIS — D510 Vitamin B12 deficiency anemia due to intrinsic factor deficiency: Secondary | ICD-10-CM | POA: Diagnosis not present

## 2016-07-11 DIAGNOSIS — D510 Vitamin B12 deficiency anemia due to intrinsic factor deficiency: Secondary | ICD-10-CM | POA: Diagnosis not present

## 2016-07-11 DIAGNOSIS — Z23 Encounter for immunization: Secondary | ICD-10-CM | POA: Diagnosis not present

## 2016-07-20 ENCOUNTER — Other Ambulatory Visit: Payer: Medicare PPO

## 2016-07-20 DIAGNOSIS — B20 Human immunodeficiency virus [HIV] disease: Secondary | ICD-10-CM | POA: Diagnosis not present

## 2016-07-20 LAB — COMPREHENSIVE METABOLIC PANEL
ALBUMIN: 4.4 g/dL (ref 3.6–5.1)
ALK PHOS: 69 U/L (ref 40–115)
ALT: 68 U/L — AB (ref 9–46)
AST: 46 U/L — AB (ref 10–35)
BILIRUBIN TOTAL: 1.3 mg/dL — AB (ref 0.2–1.2)
BUN: 17 mg/dL (ref 7–25)
CALCIUM: 9.1 mg/dL (ref 8.6–10.3)
CO2: 26 mmol/L (ref 20–31)
Chloride: 104 mmol/L (ref 98–110)
Creat: 1.28 mg/dL — ABNORMAL HIGH (ref 0.70–1.25)
GLUCOSE: 117 mg/dL — AB (ref 65–99)
POTASSIUM: 3.9 mmol/L (ref 3.5–5.3)
Sodium: 140 mmol/L (ref 135–146)
TOTAL PROTEIN: 6.5 g/dL (ref 6.1–8.1)

## 2016-07-20 LAB — CBC
HEMATOCRIT: 41.1 % (ref 38.5–50.0)
HEMOGLOBIN: 14.1 g/dL (ref 13.2–17.1)
MCH: 31.3 pg (ref 27.0–33.0)
MCHC: 34.3 g/dL (ref 32.0–36.0)
MCV: 91.1 fL (ref 80.0–100.0)
MPV: 9.8 fL (ref 7.5–12.5)
Platelets: 137 10*3/uL — ABNORMAL LOW (ref 140–400)
RBC: 4.51 MIL/uL (ref 4.20–5.80)
RDW: 15.7 % — AB (ref 11.0–15.0)
WBC: 5 10*3/uL (ref 3.8–10.8)

## 2016-07-21 LAB — T-HELPER CELL (CD4) - (RCID CLINIC ONLY)
CD4 % Helper T Cell: 36 % (ref 33–55)
CD4 T CELL ABS: 420 /uL (ref 400–2700)

## 2016-07-22 LAB — HIV-1 RNA QUANT-NO REFLEX-BLD
HIV 1 RNA Quant: <20 copies/mL (ref <20)
HIV-1 RNA Quant, Log: <1.3 Log copies/mL (ref <1.30)

## 2016-08-03 ENCOUNTER — Ambulatory Visit (INDEPENDENT_AMBULATORY_CARE_PROVIDER_SITE_OTHER): Payer: Medicare PPO | Admitting: Internal Medicine

## 2016-08-03 ENCOUNTER — Encounter: Payer: Self-pay | Admitting: Internal Medicine

## 2016-08-03 VITALS — BP 146/90 | HR 87 | Temp 98.0°F | Ht 68.0 in | Wt 181.0 lb

## 2016-08-03 DIAGNOSIS — B20 Human immunodeficiency virus [HIV] disease: Secondary | ICD-10-CM

## 2016-08-03 DIAGNOSIS — Z2911 Encounter for prophylactic immunotherapy for respiratory syncytial virus (RSV): Secondary | ICD-10-CM

## 2016-08-03 DIAGNOSIS — N289 Disorder of kidney and ureter, unspecified: Secondary | ICD-10-CM | POA: Diagnosis not present

## 2016-08-03 DIAGNOSIS — R7309 Other abnormal glucose: Secondary | ICD-10-CM

## 2016-08-03 DIAGNOSIS — Z23 Encounter for immunization: Secondary | ICD-10-CM | POA: Diagnosis not present

## 2016-08-03 NOTE — Assessment & Plan Note (Signed)
I have encouraged him to talk with his primary care provider, Dr. [name], and titrate up his dosing of gabapentin to see if his daytime pain and numbness improved and that he can tolerate side effects.

## 2016-08-03 NOTE — Progress Notes (Signed)
Patient Active Problem List   Diagnosis Date Noted  . Human immunodeficiency virus (HIV) disease ([place]) 06/12/2007    Priority: High  . Renal insufficiency 08/03/2016    Priority: Medium  . HYPERGLYCEMIA 04/09/2009    Priority: Medium  . HYPERLIPIDEMIA 12/31/2007    Priority: Medium  . 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 Gout 07/07/2015  . Erectile dysfunction 05/20/2014  . Cholecystitis with cholelithiasis 04/24/2014  . GERD 06/12/2007  . BENIGN PROSTATIC HYPERTROPHY 06/12/2007  . PNEUMONIA, HX OF 06/12/2007  . 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   ALLOPURINOL (ZYLOPRIM) 300 MG TABLET    Take 300 mg by mouth daily.   B COMPLEX VITAMINS PO    Take by mouth.   CARBOXYMETHYLCELLULOSE (REFRESH TEARS) 0.5 % SOLN    Place 1 drop into both eyes 3 (three) times daily as needed (dry eyes).   CLINDAMYCIN PHOSPHATE FOAM    Apply 1 application topically 2 (two) times daily as needed (dermatitis).    COLCHICINE 0.6 MG TABLET    Take 0.6 mg by mouth daily.   GABAPENTIN PO    Take by mouth.   GENVOYA 150-150-200-10 MG TABS TABLET    TAKE 1 TABLET BY MOUTH ONCE DAILY WITH BREAKFAST   IBUPROFEN (ADVIL,MOTRIN) 800 MG TABLET    Take 800 mg by mouth daily as needed for headache or mild pain.   KETOCONAZOLE (NIZORAL) 2 % CREAM    Apply 1 application topically daily as needed for irritation.   LOPERAMIDE (IMODIUM A-D) 2 MG TABLET    Take 2 mg by mouth every morning.    MULTIPLE VITAMIN (MULTIVITAMIN) TABLET    Take 1 tablet by mouth every morning.    MUPIROCIN OINTMENT (BACTROBAN) 2 %    Place 1 application into the nose daily as needed ("bump").   RABEPRAZOLE (ACIPHEX) 20 MG TABLET    Take 20 mg by mouth every morning.    SILDENAFIL (VIAGRA) 25 MG  TABLET    Take 1-2 tablets by mouth as needed   TAMSULOSIN (FLOMAX) 0.4 MG CAPS CAPSULE    Take 0.4 mg by mouth every morning.   Modified Medications   No medications on file  Discontinued Medications   No medications on file    Subjective: Brett Sims is in for his routine HIV follow-up visit. He states that he is doing well other than ongoing problems with painful peripheral neuropathy in his feet. He recently started on gabapentin but is only taking one dose before bedtime. He finds that that does help him sleep at night but he still has intermittent shooting pains and numbness during the day. He has been somewhat reluctant to start taking doses during the day because of potential sedation. He is getting regular exercise, going to the gym 3-5 times each week.   Review of Systems: Review of Systems  Constitutional: Negative for chills, diaphoresis, fever, malaise/fatigue and weight loss.  HENT: Negative for sore throat.   Respiratory: Negative for cough, sputum production and shortness of breath.   Cardiovascular: Negative for chest pain.  Gastrointestinal: Negative for abdominal pain, diarrhea, heartburn, nausea and vomiting.  Genitourinary: Negative for dysuria.  Musculoskeletal: Negative for joint pain and myalgias.  Skin:  Negative for rash.  Neurological: Positive for sensory change. Negative for dizziness, focal weakness and headaches.  Psychiatric/Behavioral: Negative for depression and substance abuse. The patient is not nervous/anxious.     Past Medical History:  Diagnosis Date  . Cholecystolithiasis   . Complication of anesthesia   . GERD (gastroesophageal reflux disease)   . HIV disease ([place])   . PONV (postoperative nausea and vomiting)     Social History  Substance Use Topics  . Smoking status: Never Smoker  . Smokeless tobacco: Never Used  . Alcohol use Yes     Comment: occassionally    No family history on file.  Allergies  Allergen Reactions  . Codeine Other  (See Comments)    Mood changes   . Erythromycin Nausea And Vomiting  . Sulfonamide Derivatives Nausea And Vomiting    Objective:  Vitals:   08/03/16 1547 08/03/16 1556  BP: (!) 155/91 (!) 146/90  Pulse: 87   Temp: 98 F (36.7 C)   TempSrc: Oral   Weight: 181 lb (82.1 kg)   Height: 5\' 8"  (1.727 m)    Body mass index is 27.52 kg/m.  Physical Exam  Constitutional: He is oriented to person, place, and time.  He is in good spirits as usual.  HENT:  Mouth/Throat: No oropharyngeal exudate.  Eyes: Conjunctivae are normal.  Cardiovascular: Normal rate and regular rhythm.   No murmur heard. Pulmonary/Chest: Effort normal and breath sounds normal. He has no wheezes. He has no rales.  Abdominal: Soft. He exhibits no mass. There is no tenderness.  Musculoskeletal: Normal range of motion. He exhibits no edema or tenderness.  Neurological: He is alert and oriented to person, place, and time. Gait normal.  Skin: No rash noted.  Psychiatric: Mood and affect normal.    Lab Results Lab Results  Component Value Date   WBC 5.0 07/20/2016   HGB 14.1 07/20/2016   HCT 41.1 07/20/2016   MCV 91.1 07/20/2016   PLT 137 (L) 07/20/2016    Lab Results  Component Value Date   CREATININE 1.28 (H) 07/20/2016   BUN 17 07/20/2016   NA 140 07/20/2016   K 3.9 07/20/2016   CL 104 07/20/2016   CO2 26 07/20/2016   Glucose 117 Lab Results  Component Value Date   ALT 68 (H) 07/20/2016   AST 46 (H) 07/20/2016   ALKPHOS 69 07/20/2016   BILITOT 1.3 (H) 07/20/2016    Lab Results  Component Value Date   CHOL 133 12/22/2015   HDL 32 (L) 12/22/2015   LDLCALC 28 12/22/2015   TRIG 367 (H) 12/22/2015   CHOLHDL 4.2 12/22/2015   HIV 1 RNA Quant (copies/mL)  Date Value  07/20/2016 <20  12/22/2015 <20  06/23/2015 <20   CD4 T Cell Abs (/uL)  Date Value  07/20/2016 420  12/22/2015 370 (L)  06/23/2015 150 (L)     Problem List Items Addressed This Visit      High   Human immunodeficiency  virus (HIV) disease ([place])    His HIV infection remains under excellent control knees now had CD4 reconstitution back to normal. He will continue Genvoya in follow-up after lab work in 6 months.      Relevant Orders   T-helper cell (CD4)- ([facility] clinic only)   HIV 1 RNA quant-no reflex-bld   CBC   Comprehensive metabolic panel   Lipid panel   RPR     Medium   HYPERGLYCEMIA    His blood sugar remains mildly elevated at  117.      Renal insufficiency    His creatinine remains elevated at 1.28. Jorje Guild is not a cause of nephrotoxicity. This could be related to his HIV but would require no change in current therapy.       Other Visit Diagnoses    Need for zoster vaccination    -  Primary   Relevant Orders   Varicella-zoster vaccine subcutaneous (Completed)        Michel Bickers, MD [facility] for [place] [phone] pager   [phone] cell 08/03/2016, 4:44 PM

## 2016-08-03 NOTE — Assessment & Plan Note (Signed)
His blood sugar remains mildly elevated at 117.

## 2016-08-03 NOTE — Assessment & Plan Note (Signed)
His creatinine remains elevated at 1.28. Brett Sims is not a cause of nephrotoxicity. This could be related to his HIV but would require no change in current therapy.

## 2016-08-03 NOTE — Assessment & Plan Note (Signed)
His HIV infection remains under excellent control knees now had CD4 reconstitution back to normal. He will continue Genvoya in follow-up after lab work in 6 months.

## 2016-08-12 DIAGNOSIS — D81818 Other biotin-dependent carboxylase deficiency: Secondary | ICD-10-CM | POA: Diagnosis not present

## 2016-09-12 DIAGNOSIS — D510 Vitamin B12 deficiency anemia due to intrinsic factor deficiency: Secondary | ICD-10-CM | POA: Diagnosis not present

## 2016-10-14 DIAGNOSIS — E538 Deficiency of other specified B group vitamins: Secondary | ICD-10-CM | POA: Diagnosis not present

## 2016-11-15 DIAGNOSIS — D510 Vitamin B12 deficiency anemia due to intrinsic factor deficiency: Secondary | ICD-10-CM | POA: Diagnosis not present

## 2016-12-14 DIAGNOSIS — D510 Vitamin B12 deficiency anemia due to intrinsic factor deficiency: Secondary | ICD-10-CM | POA: Diagnosis not present

## 2016-12-16 ENCOUNTER — Other Ambulatory Visit: Payer: Self-pay | Admitting: Internal Medicine

## 2016-12-16 DIAGNOSIS — B20 Human immunodeficiency virus [HIV] disease: Secondary | ICD-10-CM

## 2017-01-03 DIAGNOSIS — Z85820 Personal history of malignant melanoma of skin: Secondary | ICD-10-CM | POA: Diagnosis not present

## 2017-01-03 DIAGNOSIS — L570 Actinic keratosis: Secondary | ICD-10-CM | POA: Diagnosis not present

## 2017-01-03 DIAGNOSIS — D485 Neoplasm of uncertain behavior of skin: Secondary | ICD-10-CM | POA: Diagnosis not present

## 2017-01-03 DIAGNOSIS — D225 Melanocytic nevi of trunk: Secondary | ICD-10-CM | POA: Diagnosis not present

## 2017-01-03 DIAGNOSIS — Z1283 Encounter for screening for malignant neoplasm of skin: Secondary | ICD-10-CM | POA: Diagnosis not present

## 2017-01-03 DIAGNOSIS — X32XXXD Exposure to sunlight, subsequent encounter: Secondary | ICD-10-CM | POA: Diagnosis not present

## 2017-01-03 DIAGNOSIS — Z08 Encounter for follow-up examination after completed treatment for malignant neoplasm: Secondary | ICD-10-CM | POA: Diagnosis not present

## 2017-01-11 DIAGNOSIS — D485 Neoplasm of uncertain behavior of skin: Secondary | ICD-10-CM | POA: Diagnosis not present

## 2017-01-11 DIAGNOSIS — L089 Local infection of the skin and subcutaneous tissue, unspecified: Secondary | ICD-10-CM | POA: Diagnosis not present

## 2017-01-16 DIAGNOSIS — D510 Vitamin B12 deficiency anemia due to intrinsic factor deficiency: Secondary | ICD-10-CM | POA: Diagnosis not present

## 2017-02-16 DIAGNOSIS — D510 Vitamin B12 deficiency anemia due to intrinsic factor deficiency: Secondary | ICD-10-CM | POA: Diagnosis not present

## 2017-03-14 DIAGNOSIS — M109 Gout, unspecified: Secondary | ICD-10-CM | POA: Diagnosis not present

## 2017-03-14 DIAGNOSIS — Z1389 Encounter for screening for other disorder: Secondary | ICD-10-CM | POA: Diagnosis not present

## 2017-03-14 DIAGNOSIS — N400 Enlarged prostate without lower urinary tract symptoms: Secondary | ICD-10-CM | POA: Diagnosis not present

## 2017-03-14 DIAGNOSIS — Z21 Asymptomatic human immunodeficiency virus [HIV] infection status: Secondary | ICD-10-CM | POA: Diagnosis not present

## 2017-03-14 DIAGNOSIS — I10 Essential (primary) hypertension: Secondary | ICD-10-CM | POA: Diagnosis not present

## 2017-03-14 DIAGNOSIS — K219 Gastro-esophageal reflux disease without esophagitis: Secondary | ICD-10-CM | POA: Diagnosis not present

## 2017-03-14 DIAGNOSIS — E1142 Type 2 diabetes mellitus with diabetic polyneuropathy: Secondary | ICD-10-CM | POA: Diagnosis not present

## 2017-03-14 DIAGNOSIS — Z0000 Encounter for general adult medical examination without abnormal findings: Secondary | ICD-10-CM | POA: Diagnosis not present

## 2017-03-20 DIAGNOSIS — E538 Deficiency of other specified B group vitamins: Secondary | ICD-10-CM | POA: Diagnosis not present

## 2017-03-21 DIAGNOSIS — H401134 Primary open-angle glaucoma, bilateral, indeterminate stage: Secondary | ICD-10-CM | POA: Diagnosis not present

## 2017-03-21 DIAGNOSIS — H43393 Other vitreous opacities, bilateral: Secondary | ICD-10-CM | POA: Diagnosis not present

## 2017-03-21 DIAGNOSIS — H1132 Conjunctival hemorrhage, left eye: Secondary | ICD-10-CM | POA: Diagnosis not present

## 2017-03-21 DIAGNOSIS — D3131 Benign neoplasm of right choroid: Secondary | ICD-10-CM | POA: Diagnosis not present

## 2017-03-21 DIAGNOSIS — Z961 Presence of intraocular lens: Secondary | ICD-10-CM | POA: Diagnosis not present

## 2017-03-21 DIAGNOSIS — H26491 Other secondary cataract, right eye: Secondary | ICD-10-CM | POA: Diagnosis not present

## 2017-04-06 DIAGNOSIS — R300 Dysuria: Secondary | ICD-10-CM | POA: Diagnosis not present

## 2017-04-10 DIAGNOSIS — Z961 Presence of intraocular lens: Secondary | ICD-10-CM | POA: Diagnosis not present

## 2017-04-10 DIAGNOSIS — H43393 Other vitreous opacities, bilateral: Secondary | ICD-10-CM | POA: Diagnosis not present

## 2017-04-10 DIAGNOSIS — H401134 Primary open-angle glaucoma, bilateral, indeterminate stage: Secondary | ICD-10-CM | POA: Diagnosis not present

## 2017-04-10 DIAGNOSIS — D3131 Benign neoplasm of right choroid: Secondary | ICD-10-CM | POA: Diagnosis not present

## 2017-04-10 DIAGNOSIS — H1132 Conjunctival hemorrhage, left eye: Secondary | ICD-10-CM | POA: Diagnosis not present

## 2017-04-10 DIAGNOSIS — H26491 Other secondary cataract, right eye: Secondary | ICD-10-CM | POA: Diagnosis not present

## 2017-04-20 DIAGNOSIS — D510 Vitamin B12 deficiency anemia due to intrinsic factor deficiency: Secondary | ICD-10-CM | POA: Diagnosis not present

## 2017-05-22 DIAGNOSIS — H26491 Other secondary cataract, right eye: Secondary | ICD-10-CM | POA: Diagnosis not present

## 2017-05-22 DIAGNOSIS — D510 Vitamin B12 deficiency anemia due to intrinsic factor deficiency: Secondary | ICD-10-CM | POA: Diagnosis not present

## 2017-05-29 ENCOUNTER — Other Ambulatory Visit: Payer: Self-pay | Admitting: *Deleted

## 2017-05-29 DIAGNOSIS — B20 Human immunodeficiency virus [HIV] disease: Secondary | ICD-10-CM

## 2017-05-29 MED ORDER — ELVITEG-COBIC-EMTRICIT-TENOFAF 150-150-200-10 MG PO TABS: ORAL_TABLET | ORAL | 11 refills | Status: DC

## 2017-06-20 DIAGNOSIS — E1142 Type 2 diabetes mellitus with diabetic polyneuropathy: Secondary | ICD-10-CM | POA: Diagnosis not present

## 2017-06-20 DIAGNOSIS — I10 Essential (primary) hypertension: Secondary | ICD-10-CM | POA: Diagnosis not present

## 2017-06-20 DIAGNOSIS — D510 Vitamin B12 deficiency anemia due to intrinsic factor deficiency: Secondary | ICD-10-CM | POA: Diagnosis not present

## 2017-06-20 DIAGNOSIS — N400 Enlarged prostate without lower urinary tract symptoms: Secondary | ICD-10-CM | POA: Diagnosis not present

## 2017-06-22 ENCOUNTER — Other Ambulatory Visit: Payer: Medicare PPO

## 2017-06-22 DIAGNOSIS — N289 Disorder of kidney and ureter, unspecified: Secondary | ICD-10-CM | POA: Diagnosis not present

## 2017-06-22 DIAGNOSIS — Z113 Encounter for screening for infections with a predominantly sexual mode of transmission: Secondary | ICD-10-CM | POA: Diagnosis not present

## 2017-06-22 DIAGNOSIS — B20 Human immunodeficiency virus [HIV] disease: Secondary | ICD-10-CM

## 2017-06-22 DIAGNOSIS — Z79899 Other long term (current) drug therapy: Secondary | ICD-10-CM | POA: Diagnosis not present

## 2017-06-23 DIAGNOSIS — D510 Vitamin B12 deficiency anemia due to intrinsic factor deficiency: Secondary | ICD-10-CM | POA: Diagnosis not present

## 2017-06-23 LAB — COMPREHENSIVE METABOLIC PANEL
AG Ratio: 2.4 (calc) (ref 1.0–2.5)
ALT: 30 U/L (ref 9–46)
AST: 25 U/L (ref 10–35)
Albumin: 4.5 g/dL (ref 3.6–5.1)
Alkaline phosphatase (APISO): 69 U/L (ref 40–115)
BUN / CREAT RATIO: 12 (calc) (ref 6–22)
BUN: 15 mg/dL (ref 7–25)
CO2: 28 mmol/L (ref 20–32)
CREATININE: 1.29 mg/dL — AB (ref 0.70–1.25)
Calcium: 9.1 mg/dL (ref 8.6–10.3)
Chloride: 101 mmol/L (ref 98–110)
GLUCOSE: 202 mg/dL — AB (ref 65–99)
Globulin: 1.9 g/dL (calc) (ref 1.9–3.7)
Potassium: 4.4 mmol/L (ref 3.5–5.3)
Sodium: 138 mmol/L (ref 135–146)
Total Bilirubin: 0.9 mg/dL (ref 0.2–1.2)
Total Protein: 6.4 g/dL (ref 6.1–8.1)

## 2017-06-23 LAB — CBC
HCT: 41.2 % (ref 38.5–50.0)
Hemoglobin: 14.1 g/dL (ref 13.2–17.1)
MCH: 30.5 pg (ref 27.0–33.0)
MCHC: 34.2 g/dL (ref 32.0–36.0)
MCV: 89 fL (ref 80.0–100.0)
MPV: 10.3 fL (ref 7.5–12.5)
PLATELETS: 129 10*3/uL — AB (ref 140–400)
RBC: 4.63 10*6/uL (ref 4.20–5.80)
RDW: 14.4 % (ref 11.0–15.0)
WBC: 4.6 10*3/uL (ref 3.8–10.8)

## 2017-06-23 LAB — LIPID PANEL
Cholesterol: 112 mg/dL (ref <200)
HDL: 30 mg/dL — AB (ref >40)
LDL CHOLESTEROL (CALC): 50 mg/dL
Non-HDL Cholesterol (Calc): 82 mg/dL (calc) (ref <130)
TRIGLYCERIDES: 272 mg/dL — AB (ref <150)
Total CHOL/HDL Ratio: 3.7 (calc) (ref <5.0)

## 2017-06-23 LAB — T-HELPER CELL (CD4) - (RCID CLINIC ONLY)
CD4 % Helper T Cell: 28 % — ABNORMAL LOW (ref 33–55)
CD4 T Cell Abs: 290 /uL — ABNORMAL LOW (ref 400–2700)

## 2017-06-23 LAB — RPR: RPR Ser Ql: NONREACTIVE

## 2017-06-26 LAB — HIV-1 RNA QUANT-NO REFLEX-BLD
HIV 1 RNA QUANT: NOT DETECTED {copies}/mL
HIV-1 RNA QUANT, LOG: NOT DETECTED {Log_copies}/mL

## 2017-07-06 ENCOUNTER — Encounter: Payer: Self-pay | Admitting: Internal Medicine

## 2017-07-06 ENCOUNTER — Ambulatory Visit (INDEPENDENT_AMBULATORY_CARE_PROVIDER_SITE_OTHER): Payer: Medicare PPO | Admitting: Internal Medicine

## 2017-07-06 DIAGNOSIS — Z23 Encounter for immunization: Secondary | ICD-10-CM | POA: Diagnosis not present

## 2017-07-06 DIAGNOSIS — H4010X1 Unspecified open-angle glaucoma, mild stage: Secondary | ICD-10-CM

## 2017-07-06 DIAGNOSIS — H409 Unspecified glaucoma: Secondary | ICD-10-CM | POA: Insufficient documentation

## 2017-07-06 DIAGNOSIS — B20 Human immunodeficiency virus [HIV] disease: Secondary | ICD-10-CM

## 2017-07-06 NOTE — Progress Notes (Signed)
Patient Active Problem List   Diagnosis Date Noted  . Human immunodeficiency virus (HIV) disease ([place]) 06/12/2007    Priority: High  . Renal insufficiency 08/03/2016    Priority: Medium  . HYPERGLYCEMIA 04/09/2009    Priority: Medium  . HYPERLIPIDEMIA 12/31/2007    Priority: Medium  . Glaucoma 07/06/2017  . Peripheral neuropathy 01/05/2016  . Gout 07/07/2015  . Erectile dysfunction 05/20/2014  . Cholecystitis with cholelithiasis 04/24/2014  . GERD 06/12/2007  . BENIGN PROSTATIC HYPERTROPHY 06/12/2007  . PNEUMONIA, HX OF 06/12/2007  . 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   ALLOPURINOL (ZYLOPRIM) 300 MG TABLET    Take 300 mg by mouth daily.   B COMPLEX VITAMINS PO    Take by mouth.   CARBOXYMETHYLCELLULOSE (REFRESH TEARS) 0.5 % SOLN    Place 1 drop into both eyes 3 (three) times daily as needed (dry eyes).   CLINDAMYCIN PHOSPHATE FOAM    Apply 1 application topically 2 (two) times daily as needed (dermatitis).    ELVITEGRAVIR-COBICISTAT-EMTRICITABINE-TENOFOVIR (GENVOYA) 150-150-200-10 MG TABS TABLET    TAKE 1 TABLET BY MOUTH ONCE DAILY WITH BREAKFAST   IBUPROFEN (ADVIL,MOTRIN) 800 MG TABLET    Take 800 mg by mouth daily as needed for headache or mild pain.   KETOCONAZOLE (NIZORAL) 2 % CREAM    Apply 1 application topically daily as needed for irritation.   LATANOPROST (XALATAN) 0.005 % OPHTHALMIC SOLUTION    1 drop at bedtime.   LOPERAMIDE (IMODIUM A-D) 2 MG TABLET    Take 2 mg by mouth every morning.    MULTIPLE VITAMIN (MULTIVITAMIN) TABLET    Take 1 tablet by mouth every morning.    MUPIROCIN OINTMENT (BACTROBAN) 2 %    Place 1 application into the nose daily as needed ("bump").   PREGABALIN (LYRICA) 50 MG CAPSULE    Take 50 mg by mouth  3 (three) times daily.   RABEPRAZOLE (ACIPHEX) 20 MG TABLET    Take 20 mg by mouth every morning.    SACCHAROMYCES BOULARDII (FLORASTOR) 250 MG CAPSULE    Take 250 mg by mouth 2 (two) times daily.   SILDENAFIL (VIAGRA) 25 MG TABLET    Take 1-2 tablets by mouth as needed   TAMSULOSIN (FLOMAX) 0.4 MG CAPS CAPSULE    Take 0.4 mg by mouth every morning.   Modified Medications   No medications on file  Discontinued Medications   COLCHICINE 0.6 MG TABLET    Take 0.6 mg by mouth daily.   GABAPENTIN PO    Take by mouth.    Subjective: Brett Sims is in for his routine HIV follow-up visit. He has had no problems obtaining, taking, or tolerating his Genvoya. He does not recall missing doses. He is feeling well. He gets regular exercise. His primary care provider, Dr. Laurann Montana, changed his gabapentin to pregabalin early this year and his neuropathy causing pain and burning in his feet improved dramatically. He also started on a probiotic about one month ago and he believes that his chronic diarrhea is getting better.  Review of Systems: Review of Systems  Constitutional: Negative for chills, diaphoresis, fever, malaise/fatigue and weight loss.  HENT: Negative for sore throat.   Respiratory: Negative for  cough, sputum production and shortness of breath.   Cardiovascular: Negative for chest pain.  Gastrointestinal: Positive for diarrhea. Negative for abdominal pain, heartburn, nausea and vomiting.  Genitourinary: Negative for dysuria and frequency.  Musculoskeletal: Negative for joint pain and myalgias.  Skin: Negative for rash.  Neurological: Positive for sensory change. Negative for dizziness and headaches.  Psychiatric/Behavioral: Negative for depression and substance abuse. The patient is not nervous/anxious.     Past Medical History:  Diagnosis Date  . Cholecystolithiasis   . Complication of anesthesia   . GERD (gastroesophageal reflux disease)   . HIV disease ([place])   . PONV (postoperative nausea  and vomiting)     Social History  Substance Use Topics  . Smoking status: Never Smoker  . Smokeless tobacco: Never Used  . Alcohol use Yes     Comment: occassionally    No family history on file.  Allergies  Allergen Reactions  . Codeine Other (See Comments)    Mood changes   . Erythromycin Nausea And Vomiting  . Sulfonamide Derivatives Nausea And Vomiting    Objective:  Vitals:   07/06/17 0909  BP: (!) 162/89  Pulse: 84  Temp: 97.6 F (36.4 C)  TempSrc: Oral  Weight: 187 lb (84.8 kg)  Height: 5' 10.5" (1.791 m)   Body mass index is 26.45 kg/m.  Physical Exam  Constitutional: He is oriented to person, place, and time.  He is in good spirits as usual.  HENT:  Mouth/Throat: No oropharyngeal exudate.  Eyes: Conjunctivae are normal.  Cardiovascular: Normal rate and regular rhythm.   No murmur heard. Pulmonary/Chest: Effort normal and breath sounds normal.  Abdominal: Soft. He exhibits no mass. There is no tenderness.  No change in his central adiposity.  Musculoskeletal: Normal range of motion. He exhibits no edema or tenderness.  Neurological: He is alert and oriented to person, place, and time. Gait normal.  Skin: No rash noted.  Psychiatric: Mood and affect normal.    Lab Results Lab Results  Component Value Date   WBC 4.6 06/22/2017   HGB 14.1 06/22/2017   HCT 41.2 06/22/2017   MCV 89.0 06/22/2017   PLT 129 (L) 06/22/2017    Lab Results  Component Value Date   CREATININE 1.29 (H) 06/22/2017   BUN 15 06/22/2017   NA 138 06/22/2017   K 4.4 06/22/2017   CL 101 06/22/2017   CO2 28 06/22/2017    Lab Results  Component Value Date   ALT 30 06/22/2017   AST 25 06/22/2017   ALKPHOS 69 07/20/2016   BILITOT 0.9 06/22/2017    Lab Results  Component Value Date   CHOL 112 06/22/2017   HDL 30 (L) 06/22/2017   LDLCALC 28 12/22/2015   TRIG 272 (H) 06/22/2017   CHOLHDL 3.7 06/22/2017   Lab Results  Component Value Date   LABRPR NON-REACTIVE  06/22/2017   HIV 1 RNA Quant (copies/mL)  Date Value  06/22/2017 <20 NOT DETECTED  07/20/2016 <20  12/22/2015 <20   CD4 T Cell Abs (/uL)  Date Value  06/22/2017 290 (L)  07/20/2016 420  12/22/2015 370 (L)     Problem List Items Addressed This Visit      High   Human immunodeficiency virus (HIV) disease ([place])    Is infection remains under excellent, long-term control. He will continue Genvoya and follow-up after lab work in one year. He received his influenza vaccination today.      Relevant Orders   CBC   T-helper cell (  CD4)- ([facility] clinic only)   Comprehensive metabolic panel   Lipid panel   RPR   HIV 1 RNA quant-no reflex-bld     Unprioritized   Glaucoma   Relevant Medications   latanoprost (XALATAN) 0.005 % ophthalmic solution    Other Visit Diagnoses    Need for immunization against influenza       Relevant Orders   Flu Vaccine QUAD 36+ mos IM (Completed)        Michel Bickers, MD [facility] for [place] [phone] pager   [phone] cell 07/06/2017, 9:30 AM

## 2017-07-06 NOTE — Assessment & Plan Note (Signed)
Is infection remains under excellent, long-term control. He will continue Genvoya and follow-up after lab work in one year. He received his influenza vaccination today.

## 2017-07-12 DIAGNOSIS — Z1283 Encounter for screening for malignant neoplasm of skin: Secondary | ICD-10-CM | POA: Diagnosis not present

## 2017-07-12 DIAGNOSIS — Z85820 Personal history of malignant melanoma of skin: Secondary | ICD-10-CM | POA: Diagnosis not present

## 2017-07-12 DIAGNOSIS — Z08 Encounter for follow-up examination after completed treatment for malignant neoplasm: Secondary | ICD-10-CM | POA: Diagnosis not present

## 2017-07-24 DIAGNOSIS — D510 Vitamin B12 deficiency anemia due to intrinsic factor deficiency: Secondary | ICD-10-CM | POA: Diagnosis not present

## 2017-08-25 DIAGNOSIS — D510 Vitamin B12 deficiency anemia due to intrinsic factor deficiency: Secondary | ICD-10-CM | POA: Diagnosis not present

## 2017-09-09 DIAGNOSIS — Z6825 Body mass index (BMI) 25.0-25.9, adult: Secondary | ICD-10-CM | POA: Diagnosis not present

## 2017-09-09 DIAGNOSIS — M545 Low back pain: Secondary | ICD-10-CM | POA: Diagnosis not present

## 2017-09-09 DIAGNOSIS — Z21 Asymptomatic human immunodeficiency virus [HIV] infection status: Secondary | ICD-10-CM | POA: Diagnosis not present

## 2017-09-09 DIAGNOSIS — N400 Enlarged prostate without lower urinary tract symptoms: Secondary | ICD-10-CM | POA: Diagnosis not present

## 2017-09-25 DIAGNOSIS — Z961 Presence of intraocular lens: Secondary | ICD-10-CM | POA: Diagnosis not present

## 2017-09-25 DIAGNOSIS — H26492 Other secondary cataract, left eye: Secondary | ICD-10-CM | POA: Diagnosis not present

## 2017-09-25 DIAGNOSIS — H401111 Primary open-angle glaucoma, right eye, mild stage: Secondary | ICD-10-CM | POA: Diagnosis not present

## 2017-09-25 DIAGNOSIS — D510 Vitamin B12 deficiency anemia due to intrinsic factor deficiency: Secondary | ICD-10-CM | POA: Diagnosis not present

## 2017-09-25 DIAGNOSIS — H16223 Keratoconjunctivitis sicca, not specified as Sjogren's, bilateral: Secondary | ICD-10-CM | POA: Diagnosis not present

## 2017-09-25 DIAGNOSIS — H401122 Primary open-angle glaucoma, left eye, moderate stage: Secondary | ICD-10-CM | POA: Diagnosis not present

## 2017-10-16 DIAGNOSIS — H26492 Other secondary cataract, left eye: Secondary | ICD-10-CM | POA: Diagnosis not present

## 2017-10-27 DIAGNOSIS — D510 Vitamin B12 deficiency anemia due to intrinsic factor deficiency: Secondary | ICD-10-CM | POA: Diagnosis not present

## 2017-11-06 DIAGNOSIS — E1142 Type 2 diabetes mellitus with diabetic polyneuropathy: Secondary | ICD-10-CM | POA: Diagnosis not present

## 2017-11-06 DIAGNOSIS — N400 Enlarged prostate without lower urinary tract symptoms: Secondary | ICD-10-CM | POA: Diagnosis not present

## 2017-11-06 DIAGNOSIS — I10 Essential (primary) hypertension: Secondary | ICD-10-CM | POA: Diagnosis not present

## 2017-11-06 DIAGNOSIS — D510 Vitamin B12 deficiency anemia due to intrinsic factor deficiency: Secondary | ICD-10-CM | POA: Diagnosis not present

## 2017-11-10 DIAGNOSIS — C44319 Basal cell carcinoma of skin of other parts of face: Secondary | ICD-10-CM | POA: Diagnosis not present

## 2017-11-10 DIAGNOSIS — X32XXXD Exposure to sunlight, subsequent encounter: Secondary | ICD-10-CM | POA: Diagnosis not present

## 2017-11-10 DIAGNOSIS — B0089 Other herpesviral infection: Secondary | ICD-10-CM | POA: Diagnosis not present

## 2017-11-10 DIAGNOSIS — L570 Actinic keratosis: Secondary | ICD-10-CM | POA: Diagnosis not present

## 2017-11-28 DIAGNOSIS — D510 Vitamin B12 deficiency anemia due to intrinsic factor deficiency: Secondary | ICD-10-CM | POA: Diagnosis not present

## 2017-12-22 DIAGNOSIS — Z08 Encounter for follow-up examination after completed treatment for malignant neoplasm: Secondary | ICD-10-CM | POA: Diagnosis not present

## 2017-12-22 DIAGNOSIS — Z85828 Personal history of other malignant neoplasm of skin: Secondary | ICD-10-CM | POA: Diagnosis not present

## 2017-12-27 DIAGNOSIS — D510 Vitamin B12 deficiency anemia due to intrinsic factor deficiency: Secondary | ICD-10-CM | POA: Diagnosis not present

## 2018-01-24 DIAGNOSIS — H401123 Primary open-angle glaucoma, left eye, severe stage: Secondary | ICD-10-CM | POA: Diagnosis not present

## 2018-01-24 DIAGNOSIS — H401111 Primary open-angle glaucoma, right eye, mild stage: Secondary | ICD-10-CM | POA: Diagnosis not present

## 2018-01-24 DIAGNOSIS — Z961 Presence of intraocular lens: Secondary | ICD-10-CM | POA: Diagnosis not present

## 2018-01-24 DIAGNOSIS — H26492 Other secondary cataract, left eye: Secondary | ICD-10-CM | POA: Diagnosis not present

## 2018-01-24 DIAGNOSIS — H16223 Keratoconjunctivitis sicca, not specified as Sjogren's, bilateral: Secondary | ICD-10-CM | POA: Diagnosis not present

## 2018-01-29 DIAGNOSIS — D510 Vitamin B12 deficiency anemia due to intrinsic factor deficiency: Secondary | ICD-10-CM | POA: Diagnosis not present

## 2018-03-01 DIAGNOSIS — E538 Deficiency of other specified B group vitamins: Secondary | ICD-10-CM | POA: Diagnosis not present

## 2018-03-07 DIAGNOSIS — Z961 Presence of intraocular lens: Secondary | ICD-10-CM | POA: Diagnosis not present

## 2018-03-07 DIAGNOSIS — H26492 Other secondary cataract, left eye: Secondary | ICD-10-CM | POA: Diagnosis not present

## 2018-03-07 DIAGNOSIS — H401111 Primary open-angle glaucoma, right eye, mild stage: Secondary | ICD-10-CM | POA: Diagnosis not present

## 2018-03-07 DIAGNOSIS — H16223 Keratoconjunctivitis sicca, not specified as Sjogren's, bilateral: Secondary | ICD-10-CM | POA: Diagnosis not present

## 2018-03-16 DIAGNOSIS — I10 Essential (primary) hypertension: Secondary | ICD-10-CM | POA: Diagnosis not present

## 2018-03-16 DIAGNOSIS — N400 Enlarged prostate without lower urinary tract symptoms: Secondary | ICD-10-CM | POA: Diagnosis not present

## 2018-03-16 DIAGNOSIS — D510 Vitamin B12 deficiency anemia due to intrinsic factor deficiency: Secondary | ICD-10-CM | POA: Diagnosis not present

## 2018-03-16 DIAGNOSIS — K219 Gastro-esophageal reflux disease without esophagitis: Secondary | ICD-10-CM | POA: Diagnosis not present

## 2018-03-16 DIAGNOSIS — N529 Male erectile dysfunction, unspecified: Secondary | ICD-10-CM | POA: Diagnosis not present

## 2018-03-16 DIAGNOSIS — Z0000 Encounter for general adult medical examination without abnormal findings: Secondary | ICD-10-CM | POA: Diagnosis not present

## 2018-03-16 DIAGNOSIS — G629 Polyneuropathy, unspecified: Secondary | ICD-10-CM | POA: Diagnosis not present

## 2018-03-16 DIAGNOSIS — R739 Hyperglycemia, unspecified: Secondary | ICD-10-CM | POA: Diagnosis not present

## 2018-03-16 DIAGNOSIS — Z1389 Encounter for screening for other disorder: Secondary | ICD-10-CM | POA: Diagnosis not present

## 2018-03-16 DIAGNOSIS — M109 Gout, unspecified: Secondary | ICD-10-CM | POA: Diagnosis not present

## 2018-03-16 DIAGNOSIS — Z85820 Personal history of malignant melanoma of skin: Secondary | ICD-10-CM | POA: Diagnosis not present

## 2018-04-02 DIAGNOSIS — D510 Vitamin B12 deficiency anemia due to intrinsic factor deficiency: Secondary | ICD-10-CM | POA: Diagnosis not present

## 2018-05-03 DIAGNOSIS — D510 Vitamin B12 deficiency anemia due to intrinsic factor deficiency: Secondary | ICD-10-CM | POA: Diagnosis not present

## 2018-05-23 DIAGNOSIS — Z08 Encounter for follow-up examination after completed treatment for malignant neoplasm: Secondary | ICD-10-CM | POA: Diagnosis not present

## 2018-05-23 DIAGNOSIS — X32XXXD Exposure to sunlight, subsequent encounter: Secondary | ICD-10-CM | POA: Diagnosis not present

## 2018-05-23 DIAGNOSIS — L570 Actinic keratosis: Secondary | ICD-10-CM | POA: Diagnosis not present

## 2018-05-23 DIAGNOSIS — Z85828 Personal history of other malignant neoplasm of skin: Secondary | ICD-10-CM | POA: Diagnosis not present

## 2018-05-23 DIAGNOSIS — C4441 Basal cell carcinoma of skin of scalp and neck: Secondary | ICD-10-CM | POA: Diagnosis not present

## 2018-06-04 ENCOUNTER — Other Ambulatory Visit: Payer: Self-pay | Admitting: Internal Medicine

## 2018-06-04 DIAGNOSIS — B20 Human immunodeficiency virus [HIV] disease: Secondary | ICD-10-CM

## 2018-06-06 DIAGNOSIS — E538 Deficiency of other specified B group vitamins: Secondary | ICD-10-CM | POA: Diagnosis not present

## 2018-06-20 ENCOUNTER — Other Ambulatory Visit: Payer: Medicare PPO

## 2018-06-20 DIAGNOSIS — E785 Hyperlipidemia, unspecified: Secondary | ICD-10-CM | POA: Diagnosis not present

## 2018-06-20 DIAGNOSIS — B20 Human immunodeficiency virus [HIV] disease: Secondary | ICD-10-CM

## 2018-06-21 LAB — T-HELPER CELL (CD4) - (RCID CLINIC ONLY)
CD4 T CELL HELPER: 33 % (ref 33–55)
CD4 T Cell Abs: 330 /uL — ABNORMAL LOW (ref 400–2700)

## 2018-06-22 LAB — HIV-1 RNA QUANT-NO REFLEX-BLD
HIV 1 RNA QUANT: NOT DETECTED {copies}/mL
HIV-1 RNA QUANT, LOG: NOT DETECTED {Log_copies}/mL

## 2018-06-22 LAB — COMPREHENSIVE METABOLIC PANEL
AG Ratio: 2.3 (calc) (ref 1.0–2.5)
ALT: 44 U/L (ref 9–46)
AST: 35 U/L (ref 10–35)
Albumin: 4.3 g/dL (ref 3.6–5.1)
Alkaline phosphatase (APISO): 60 U/L (ref 40–115)
BUN/Creatinine Ratio: 14 (calc) (ref 6–22)
BUN: 17 mg/dL (ref 7–25)
CALCIUM: 9 mg/dL (ref 8.6–10.3)
CHLORIDE: 103 mmol/L (ref 98–110)
CO2: 27 mmol/L (ref 20–32)
CREATININE: 1.21 mg/dL — AB (ref 0.70–1.18)
Globulin: 1.9 g/dL (calc) (ref 1.9–3.7)
Glucose, Bld: 165 mg/dL — ABNORMAL HIGH (ref 65–99)
Potassium: 4.1 mmol/L (ref 3.5–5.3)
Sodium: 138 mmol/L (ref 135–146)
Total Bilirubin: 1.3 mg/dL — ABNORMAL HIGH (ref 0.2–1.2)
Total Protein: 6.2 g/dL (ref 6.1–8.1)

## 2018-06-22 LAB — CBC
HEMATOCRIT: 38.8 % (ref 38.5–50.0)
HEMOGLOBIN: 13.7 g/dL (ref 13.2–17.1)
MCH: 31 pg (ref 27.0–33.0)
MCHC: 35.3 g/dL (ref 32.0–36.0)
MCV: 87.8 fL (ref 80.0–100.0)
MPV: 9.9 fL (ref 7.5–12.5)
Platelets: 126 10*3/uL — ABNORMAL LOW (ref 140–400)
RBC: 4.42 10*6/uL (ref 4.20–5.80)
RDW: 14.4 % (ref 11.0–15.0)
WBC: 4.7 10*3/uL (ref 3.8–10.8)

## 2018-06-22 LAB — LIPID PANEL
CHOL/HDL RATIO: 3.4 (calc) (ref <5.0)
Cholesterol: 106 mg/dL (ref <200)
HDL: 31 mg/dL — ABNORMAL LOW (ref >40)
LDL Cholesterol (Calc): 50 mg/dL (calc)
NON-HDL CHOLESTEROL (CALC): 75 mg/dL (ref <130)
TRIGLYCERIDES: 176 mg/dL — AB (ref <150)

## 2018-06-22 LAB — RPR: RPR Ser Ql: NONREACTIVE

## 2018-07-03 ENCOUNTER — Other Ambulatory Visit: Payer: Self-pay | Admitting: Internal Medicine

## 2018-07-03 ENCOUNTER — Ambulatory Visit
Admission: RE | Admit: 2018-07-03 | Discharge: 2018-07-03 | Disposition: A | Discharge status: Home / Self Care | Payer: Medicare PPO | Source: Ambulatory Visit | Attending: Internal Medicine | Admitting: Internal Medicine

## 2018-07-03 ENCOUNTER — Encounter (HOSPITAL_COMMUNITY): Payer: Self-pay

## 2018-07-03 ENCOUNTER — Observation Stay (HOSPITAL_COMMUNITY): Payer: Medicare PPO

## 2018-07-03 ENCOUNTER — Inpatient Hospital Stay (HOSPITAL_COMMUNITY)
Admission: EM | Admit: 2018-07-03 | Discharge: 2018-07-06 | DRG: 389 | Disposition: A | Discharge status: Home / Self Care | Payer: Medicare PPO | Attending: Internal Medicine | Admitting: Internal Medicine

## 2018-07-03 ENCOUNTER — Other Ambulatory Visit: Payer: Self-pay

## 2018-07-03 ENCOUNTER — Emergency Department (HOSPITAL_COMMUNITY): Payer: Medicare PPO

## 2018-07-03 DIAGNOSIS — Z888 Allergy status to other drugs, medicaments and biological substances status: Secondary | ICD-10-CM | POA: Diagnosis not present

## 2018-07-03 DIAGNOSIS — Z85828 Personal history of other malignant neoplasm of skin: Secondary | ICD-10-CM

## 2018-07-03 DIAGNOSIS — R748 Abnormal levels of other serum enzymes: Secondary | ICD-10-CM | POA: Diagnosis not present

## 2018-07-03 DIAGNOSIS — N2889 Other specified disorders of kidney and ureter: Secondary | ICD-10-CM

## 2018-07-03 DIAGNOSIS — K760 Fatty (change of) liver, not elsewhere classified: Secondary | ICD-10-CM | POA: Diagnosis present

## 2018-07-03 DIAGNOSIS — K219 Gastro-esophageal reflux disease without esophagitis: Secondary | ICD-10-CM | POA: Diagnosis present

## 2018-07-03 DIAGNOSIS — Z9842 Cataract extraction status, left eye: Secondary | ICD-10-CM | POA: Diagnosis not present

## 2018-07-03 DIAGNOSIS — R93429 Abnormal radiologic findings on diagnostic imaging of unspecified kidney: Secondary | ICD-10-CM | POA: Diagnosis not present

## 2018-07-03 DIAGNOSIS — K439 Ventral hernia without obstruction or gangrene: Secondary | ICD-10-CM | POA: Diagnosis present

## 2018-07-03 DIAGNOSIS — D3002 Benign neoplasm of left kidney: Secondary | ICD-10-CM | POA: Diagnosis present

## 2018-07-03 DIAGNOSIS — Z79899 Other long term (current) drug therapy: Secondary | ICD-10-CM

## 2018-07-03 DIAGNOSIS — Z885 Allergy status to narcotic agent status: Secondary | ICD-10-CM

## 2018-07-03 DIAGNOSIS — Z0189 Encounter for other specified special examinations: Secondary | ICD-10-CM | POA: Diagnosis not present

## 2018-07-03 DIAGNOSIS — R1084 Generalized abdominal pain: Secondary | ICD-10-CM

## 2018-07-03 DIAGNOSIS — N183 Chronic kidney disease, stage 3 unspecified: Secondary | ICD-10-CM | POA: Diagnosis present

## 2018-07-03 DIAGNOSIS — K529 Noninfective gastroenteritis and colitis, unspecified: Secondary | ICD-10-CM | POA: Diagnosis present

## 2018-07-03 DIAGNOSIS — R935 Abnormal findings on diagnostic imaging of other abdominal regions, including retroperitoneum: Secondary | ICD-10-CM | POA: Diagnosis not present

## 2018-07-03 DIAGNOSIS — G629 Polyneuropathy, unspecified: Secondary | ICD-10-CM | POA: Diagnosis present

## 2018-07-03 DIAGNOSIS — K56609 Unspecified intestinal obstruction, unspecified as to partial versus complete obstruction: Secondary | ICD-10-CM | POA: Diagnosis not present

## 2018-07-03 DIAGNOSIS — Z9841 Cataract extraction status, right eye: Secondary | ICD-10-CM

## 2018-07-03 DIAGNOSIS — N400 Enlarged prostate without lower urinary tract symptoms: Secondary | ICD-10-CM | POA: Diagnosis present

## 2018-07-03 DIAGNOSIS — K567 Ileus, unspecified: Secondary | ICD-10-CM | POA: Diagnosis present

## 2018-07-03 DIAGNOSIS — E785 Hyperlipidemia, unspecified: Secondary | ICD-10-CM | POA: Diagnosis present

## 2018-07-03 DIAGNOSIS — Z881 Allergy status to other antibiotic agents status: Secondary | ICD-10-CM | POA: Diagnosis not present

## 2018-07-03 DIAGNOSIS — B20 Human immunodeficiency virus [HIV] disease: Secondary | ICD-10-CM | POA: Diagnosis present

## 2018-07-03 DIAGNOSIS — Z961 Presence of intraocular lens: Secondary | ICD-10-CM | POA: Diagnosis present

## 2018-07-03 DIAGNOSIS — Z9621 Cochlear implant status: Secondary | ICD-10-CM | POA: Diagnosis present

## 2018-07-03 DIAGNOSIS — Z9049 Acquired absence of other specified parts of digestive tract: Secondary | ICD-10-CM

## 2018-07-03 DIAGNOSIS — R197 Diarrhea, unspecified: Secondary | ICD-10-CM | POA: Diagnosis not present

## 2018-07-03 DIAGNOSIS — R161 Splenomegaly, not elsewhere classified: Secondary | ICD-10-CM | POA: Diagnosis not present

## 2018-07-03 DIAGNOSIS — R112 Nausea with vomiting, unspecified: Secondary | ICD-10-CM | POA: Diagnosis not present

## 2018-07-03 DIAGNOSIS — Z882 Allergy status to sulfonamides status: Secondary | ICD-10-CM | POA: Diagnosis not present

## 2018-07-03 DIAGNOSIS — R109 Unspecified abdominal pain: Secondary | ICD-10-CM | POA: Diagnosis not present

## 2018-07-03 DIAGNOSIS — H409 Unspecified glaucoma: Secondary | ICD-10-CM | POA: Diagnosis present

## 2018-07-03 DIAGNOSIS — Z4682 Encounter for fitting and adjustment of non-vascular catheter: Secondary | ICD-10-CM | POA: Diagnosis not present

## 2018-07-03 LAB — CBC
HCT: 45.4 % (ref 39.0–52.0)
Hemoglobin: 16.3 g/dL (ref 13.0–17.0)
MCH: 31.3 pg (ref 26.0–34.0)
MCHC: 35.9 g/dL (ref 30.0–36.0)
MCV: 87.3 fL (ref 78.0–100.0)
PLATELETS: 156 10*3/uL (ref 150–400)
RBC: 5.2 MIL/uL (ref 4.22–5.81)
RDW: 14.2 % (ref 11.5–15.5)
WBC: 7.8 10*3/uL (ref 4.0–10.5)

## 2018-07-03 LAB — COMPREHENSIVE METABOLIC PANEL
ALT: 46 U/L — AB (ref 0–44)
AST: 36 U/L (ref 15–41)
Albumin: 5.3 g/dL — ABNORMAL HIGH (ref 3.5–5.0)
Alkaline Phosphatase: 77 U/L (ref 38–126)
Anion gap: 12 (ref 5–15)
BILIRUBIN TOTAL: 2 mg/dL — AB (ref 0.3–1.2)
BUN: 17 mg/dL (ref 8–23)
CALCIUM: 10.3 mg/dL (ref 8.9–10.3)
CO2: 27 mmol/L (ref 22–32)
CREATININE: 1.29 mg/dL — AB (ref 0.61–1.24)
Chloride: 104 mmol/L (ref 98–111)
GFR calc Af Amer: >60 mL/min (ref >60)
GFR, EST NON AFRICAN AMERICAN: 55 mL/min — AB (ref >60)
Glucose, Bld: 140 mg/dL — ABNORMAL HIGH (ref 70–99)
Potassium: 3.9 mmol/L (ref 3.5–5.1)
Sodium: 143 mmol/L (ref 135–145)
TOTAL PROTEIN: 8.6 g/dL — AB (ref 6.5–8.1)

## 2018-07-03 LAB — LIPASE, BLOOD: Lipase: 33 U/L (ref 11–51)

## 2018-07-03 MED ORDER — HYDRALAZINE HCL 20 MG/ML IJ SOLN
10.0000 mg | INTRAMUSCULAR | Status: DC | PRN
  Filled 2018-07-03: qty 1

## 2018-07-03 MED ORDER — ONDANSETRON HCL 4 MG PO TABS: 4.0000 mg | ORAL_TABLET | Freq: Four times a day (QID) | ORAL | Status: DC | PRN

## 2018-07-03 MED ORDER — FENTANYL CITRATE (PF) 100 MCG/2ML IJ SOLN
50.0000 ug | Freq: Once | INTRAMUSCULAR | Status: AC
  Administered 2018-07-03: 50 ug via INTRAVENOUS
  Filled 2018-07-03: qty 2

## 2018-07-03 MED ORDER — FAMOTIDINE IN NACL 20-0.9 MG/50ML-% IV SOLN
20.0000 mg | Freq: Two times a day (BID) | INTRAVENOUS | Status: DC
  Administered 2018-07-03 – 2018-07-06 (×6): 20 mg via INTRAVENOUS
  Filled 2018-07-03 (×6): qty 50

## 2018-07-03 MED ORDER — ACETAMINOPHEN 325 MG PO TABS: 650.0000 mg | ORAL_TABLET | Freq: Four times a day (QID) | ORAL | Status: DC | PRN

## 2018-07-03 MED ORDER — SODIUM CHLORIDE 0.9 % IV BOLUS
1000.0000 mL | Freq: Once | INTRAVENOUS | Status: AC
  Administered 2018-07-03: 1000 mL via INTRAVENOUS

## 2018-07-03 MED ORDER — IOPAMIDOL (ISOVUE-300) INJECTION 61%
100.0000 mL | Freq: Once | INTRAVENOUS | Status: AC | PRN
  Administered 2018-07-03: 100 mL via INTRAVENOUS

## 2018-07-03 MED ORDER — ONDANSETRON HCL 4 MG/2ML IJ SOLN
4.0000 mg | Freq: Four times a day (QID) | INTRAMUSCULAR | Status: DC | PRN
  Administered 2018-07-03 – 2018-07-04 (×3): 4 mg via INTRAVENOUS
  Filled 2018-07-03 (×3): qty 2

## 2018-07-03 MED ORDER — IOPAMIDOL (ISOVUE-300) INJECTION 61%
INTRAVENOUS | Status: AC
  Filled 2018-07-03: qty 100

## 2018-07-03 MED ORDER — SODIUM CHLORIDE 0.9 % IV SOLN
INTRAVENOUS | Status: DC
  Administered 2018-07-03 – 2018-07-05 (×4): via INTRAVENOUS

## 2018-07-03 MED ORDER — ONDANSETRON HCL 4 MG/2ML IJ SOLN
4.0000 mg | Freq: Once | INTRAMUSCULAR | Status: AC
  Administered 2018-07-03: 4 mg via INTRAVENOUS
  Filled 2018-07-03: qty 2

## 2018-07-03 MED ORDER — ALBUTEROL SULFATE (2.5 MG/3ML) 0.083% IN NEBU: 2.5000 mg | INHALATION_SOLUTION | Freq: Four times a day (QID) | RESPIRATORY_TRACT | Status: DC | PRN

## 2018-07-03 MED ORDER — ACETAMINOPHEN 650 MG RE SUPP: 650.0000 mg | Freq: Four times a day (QID) | RECTAL | Status: DC | PRN

## 2018-07-03 MED ORDER — FENTANYL CITRATE (PF) 100 MCG/2ML IJ SOLN
25.0000 ug | INTRAMUSCULAR | Status: DC | PRN
  Administered 2018-07-03 – 2018-07-04 (×4): 25 ug via INTRAVENOUS
  Filled 2018-07-03 (×4): qty 2

## 2018-07-03 MED ORDER — LIDOCAINE HCL URETHRAL/MUCOSAL 2 % EX GEL
CUTANEOUS | Status: AC
  Filled 2018-07-03: qty 5

## 2018-07-03 MED ORDER — DIATRIZOATE MEGLUMINE & SODIUM 66-10 % PO SOLN
90.0000 mL | Freq: Once | ORAL | Status: AC
  Administered 2018-07-03: 90 mL via NASOGASTRIC
  Filled 2018-07-03: qty 90

## 2018-07-03 MED ORDER — ENOXAPARIN SODIUM 40 MG/0.4ML ~~LOC~~ SOLN
40.0000 mg | SUBCUTANEOUS | Status: DC
  Administered 2018-07-03 – 2018-07-04 (×2): 40 mg via SUBCUTANEOUS
  Filled 2018-07-03 (×4): qty 0.4

## 2018-07-03 NOTE — ED Notes (Addendum)
Patient aware we need urine sample. Urinal at bedside.  

## 2018-07-03 NOTE — ED Notes (Signed)
ED TO INPATIENT HANDOFF REPORT  Name/Age/Gender Brett Sims 72 y.o. male  Code Status    Code Status Orders  (From admission, onward)         Start     Ordered   07/03/18 2027  Full code  Continuous     07/03/18 2030        Code Status History    Date Active Date Inactive Code Status Order ID Comments User Context   04/24/2014 0900 04/25/2014 1450 Full Code [id number]  Earnstine Regal, PA-C ED    Advance Directive Documentation     Most Recent Value  Type of Advance Directive  Healthcare Power of Attorney, Living will  Pre-existing out of facility DNR order (yellow form or pink MOST form)  -  "MOST" Form in Place?  -      Home/SNF/Other Home  Chief Complaint SBO   Level of Care/Admitting Diagnosis ED Disposition    ED Disposition Condition [place]: [facility] [[id number]]  Level of Care: Med-Surg [16]  Diagnosis: [facility]) [[id number]]  Admitting Physician: Norval Morton [[id number]]  Attending Physician: Norval Morton [[id number]]  PT Class (Do Not Modify): Observation [104]  PT Acc Code (Do Not Modify): Observation [10022]       Medical History Past Medical History:  Diagnosis Date  . Cholecystolithiasis   . Complication of anesthesia   . GERD (gastroesophageal reflux disease)   . HIV disease ([place])   . PONV (postoperative nausea and vomiting)     Allergies Allergies  Allergen Reactions  . Codeine Other (See Comments)    Mood changes   . Erythromycin Nausea And Vomiting  . Sulfonamide Derivatives Nausea And Vomiting  . Sulfamethoxazole Rash    IV Location/Drains/Wounds Patient Lines/Drains/Airways Status   Active Line/Drains/Airways    Name:   Placement date:   Placement time:   Site:   Days:   Peripheral IV 08/19/14 Right   08/19/14    1015    -   1414   Peripheral IV 08/19/14 Right Hand   08/19/14    1029    Hand   1414   Peripheral IV 07/03/18 Left Antecubital   07/03/18    1643    Antecubital    less than 1   AIRWAYS   08/19/14    1039     1414   AIRWAYS   08/19/14    1026     1414   Incision (Closed) 04/24/14 Abdomen Other (Comment)   04/24/14    1501     1531   Incision - 4 Ports Abdomen 1: Right;Lateral 2: Right;Medial 3: Umbilicus 4: Mid;Upper   04/24/14    -     1531          Labs/Imaging Results for orders placed or performed during the hospital encounter of 07/03/18 (from the past 48 hour(s))  Lipase, blood     Status: None   Collection Time: 07/03/18  4:35 PM  Result Value Ref Range   Lipase 33 11 - 51 U/L    Comment: Performed at [facility], [place] [address] [place], [place] [address]  Comprehensive metabolic panel     Status: Abnormal   Collection Time: 07/03/18  4:35 PM  Result Value Ref Range   Sodium 143 135 - 145 mmol/L   Potassium 3.9 3.5 - 5.1 mmol/L   Chloride 104 98 - 111 mmol/L   CO2 27 22 - 32  mmol/L   Glucose, Bld 140 (H) 70 - 99 mg/dL   BUN 17 8 - 23 mg/dL   Creatinine, Ser 1.29 (H) 0.61 - 1.24 mg/dL   Calcium 10.3 8.9 - 10.3 mg/dL   Total Protein 8.6 (H) 6.5 - 8.1 g/dL   Albumin 5.3 (H) 3.5 - 5.0 g/dL   AST 36 15 - 41 U/L   ALT 46 (H) 0 - 44 U/L   Alkaline Phosphatase 77 38 - 126 U/L   Total Bilirubin 2.0 (H) 0.3 - 1.2 mg/dL   GFR calc non Af Amer 55 (L) >60 mL/min   GFR calc Af Amer >60 >60 mL/min    Comment: (NOTE) The eGFR has been calculated using the CKD EPI equation. This calculation has not been validated in all clinical situations. eGFR's persistently <60 mL/min signify possible Chronic Kidney Disease.    Anion gap 12 5 - 15    Comment: Performed at [facility], [place] [address], [place], [place] [address]  CBC     Status: None   Collection Time: 07/03/18  4:35 PM  Result Value Ref Range   WBC 7.8 4.0 - 10.5 K/uL   RBC 5.20 4.22 - 5.81 MIL/uL   Hemoglobin 16.3 13.0 - 17.0 g/dL   HCT 45.4 39.0 - 52.0 %   MCV 87.3 78.0 - 100.0 fL   MCH 31.3 26.0 - 34.0 pg   MCHC 35.9 30.0 - 36.0 g/dL    RDW 14.2 11.5 - 15.5 %   Platelets 156 150 - 400 K/uL    Comment: Performed at [facility], [place] [address] [place], [place] [address]   Ct Abdomen Pelvis W Contrast  Result Date: 07/03/2018 CLINICAL DATA:  Acute generalized abdominal pain. EXAM: CT ABDOMEN AND PELVIS WITH CONTRAST TECHNIQUE: Multidetector CT imaging of the abdomen and pelvis was performed using the standard protocol following bolus administration of intravenous contrast. CONTRAST:  144m ISOVUE-300 IOPAMIDOL (ISOVUE-300) INJECTION 61% COMPARISON:  CT scan of April 24, 2014. FINDINGS: Lower chest: No acute abnormality. Hepatobiliary: No focal liver abnormality is seen. Status post cholecystectomy. No biliary dilatation. Pancreas: Unremarkable. No pancreatic ductal dilatation or surrounding inflammatory changes. Spleen: Normal in size without focal abnormality. Adrenals/Urinary Tract: Adrenal glands appear normal. Bilateral simple renal cysts are noted. No hydronephrosis or renal obstruction is noted. No renal or ureteral calculi are noted. Urinary bladder is unremarkable. 2.1 cm rounded enhancing abnormality is seen in midpole of left kidney which which is concerning for neoplasm. Stomach/Bowel: The stomach appears normal. The appendix appears normal. No colonic dilatation is noted. Mildly dilated small bowel loops are noted in the central portion of the abdomen which may represent focal ileus. Vascular/Lymphatic: No significant vascular findings are present. No enlarged abdominal or pelvic lymph nodes. Reproductive: Prostate is unremarkable. Other: No abdominal wall hernia or abnormality. No abdominopelvic ascites. Musculoskeletal: No acute or significant osseous findings. IMPRESSION: Mildly dilated small bowel loops are noted in central portion of abdomen concerning for possible ileus or less likely small bowel obstruction. Continued radiographic follow-up is recommended. 2.1 cm rounded enhancing abnormality seen in the  midpole cortex of the left kidney which is concerning for possible neoplasm or malignancy. MRI is recommended for further evaluation. Electronically Signed   By: JMarijo Conception M.D.   On: 07/03/2018 19:02   Dg Abd 2 Views  Result Date: 07/03/2018 CLINICAL DATA:  Abdominal pain with nausea and vomiting EXAM: ABDOMEN - 2 VIEW COMPARISON:  None. FINDINGS: Scattered large and  small bowel gas is noted. Multiple dilated loops of small bowel with differential air-fluid levels are seen consistent with at least a partial small bowel obstruction. No free air is noted. No acute bony abnormality is seen. No mass lesion is noted. IMPRESSION: Changes consistent with a least partial small bowel obstruction. CT is recommended for further evaluation. These results will be called to the ordering clinician or representative by the Radiologist Assistant, and communication documented in the PACS or zVision Dashboard. Electronically Signed   By: Inez Catalina M.D.   On: 07/03/2018 15:16    Pending Labs Unresulted Labs (From admission, onward)    Start     Ordered   07/04/18 0500  CBC  Tomorrow morning,   R     07/03/18 2030   07/04/18 6644  Basic metabolic panel  Tomorrow morning,   R     07/03/18 2030   07/03/18 1625  Urinalysis, Routine w reflex microscopic  STAT,   STAT     07/03/18 1624          Vitals/Pain Today's Vitals   07/03/18 1804 07/03/18 1850 07/03/18 1930 07/03/18 2000  BP:  (!) 142/83 129/85 (!) 153/92  Pulse:  80 88 85  Resp:  18  19  Temp:      TempSrc:      SpO2:  96% 92% 96%  Weight:      Height:      PainSc: 3        Isolation Precautions No active isolations  Medications Medications  iopamidol (ISOVUE-300) 61 % injection (has no administration in time range)  lidocaine (XYLOCAINE) 2 % jelly (has no administration in time range)  enoxaparin (LOVENOX) injection 40 mg (has no administration in time range)  0.9 %  sodium chloride infusion (has no administration in time range)   acetaminophen (TYLENOL) tablet 650 mg (has no administration in time range)    Or  acetaminophen (TYLENOL) suppository 650 mg (has no administration in time range)  ondansetron (ZOFRAN) tablet 4 mg (has no administration in time range)    Or  ondansetron (ZOFRAN) injection 4 mg (has no administration in time range)  albuterol (PROVENTIL) (2.5 MG/3ML) 0.083% nebulizer solution 2.5 mg (has no administration in time range)  fentaNYL (SUBLIMAZE) injection 25 mcg (has no administration in time range)  diatrizoate meglumine-sodium (GASTROGRAFIN) 66-10 % solution 90 mL (has no administration in time range)  fentaNYL (SUBLIMAZE) injection 50 mcg (50 mcg Intravenous Given 07/03/18 1743)  ondansetron (ZOFRAN) injection 4 mg (4 mg Intravenous Given 07/03/18 1743)  sodium chloride 0.9 % bolus 1,000 mL (1,000 mLs Intravenous New Bag/Given 07/03/18 1741)  iopamidol (ISOVUE-300) 61 % injection 100 mL (100 mLs Intravenous Contrast Given 07/03/18 1823)  fentaNYL (SUBLIMAZE) injection 50 mcg (50 mcg Intravenous Given 07/03/18 1852)    Mobility walks with person assist

## 2018-07-03 NOTE — H&P (Signed)
History and Physical    Brett Sims WGY:[id number] DOB: 27-Mar-1946 DOA: 07/03/2018  Referring MD/NP/PA:Micheal Rodolph Bong, PA-C PCP: [name], MD  Patient coming from: home   Chief Complaint: Abdominal pain  I have personally briefly reviewed patient's old medical records in [place]   HPI: Brett Sims is a 72 y.o. male with medical history significant of HIV with neuropathy and GERD; who presents with complaints of constant generalized abdominal pain and distention over the last 2 days.  Patient reports having associated symptoms of 10-12 episodes of diarrhea.  Diarrhea has had no blood in it and appeared yellowish-brownish in color.  He also reported having several episodes of vomiting.  Emesis was noted to be nonbloody and nonbilious and..  Vomiting seem to provide temporary relief of abdominal pain and distention.  He had followed up with his primary care provider who had performed x-rays of the skin concern for small bowel obstruction for which he was sent to the emergency department for further evaluation.  He has had previous surgeries including cholecystectomy and umbilical hernia in the past.  Patient reports that he has not been able to passing flatus since early this morning and his breathing has been somewhat labored.  He reports having similar symptoms 1 week ago that lasted 48 hours and self resolved.  He thought those symptoms were possibly related to food poisoning at that time.  ED Course: Upon admission into the emergency department patient was noted to have relatively stable vital signs.  Labs revealed creatinine 1.29, BUN 17, glucose 140, albumin 5.3, ALT 46, total protein 5.6, and total bilirubin 2.  CT scan of the abdomen pelvis revealed mildly dilated small loops of bowel concerning for possible ileus or small bowel obstruction and a 2.1 cm round enhancing abnormality seen around the mid pole cortex of the left kidney.  Patient was given 1 L of normal saline IV  fluids, Zofran, and fentanyl.  Orders for a nasogastric tube to be placed.  Review of Systems  Constitutional: Positive for malaise/fatigue. Negative for chills and fever.  HENT: Positive for congestion and sore throat. Negative for ear discharge.   Eyes: Negative for photophobia and pain.  Respiratory: Positive for shortness of breath. Negative for cough.   Cardiovascular: Negative for chest pain and leg swelling.  Gastrointestinal: Positive for abdominal pain, diarrhea, nausea and vomiting.  Genitourinary: Negative for dysuria and hematuria.  Musculoskeletal: Positive for back pain. Negative for falls.  Skin: Negative for itching and rash.  Neurological: Positive for weakness. Negative for focal weakness and loss of consciousness.  Endo/Heme/Allergies: Negative for polydipsia. Does not bruise/bleed easily.  Psychiatric/Behavioral: Negative for memory loss and substance abuse.    Past Medical History:  Diagnosis Date  . Cholecystolithiasis   . Complication of anesthesia   . GERD (gastroesophageal reflux disease)   . HIV disease ([place])   . PONV (postoperative nausea and vomiting)     Past Surgical History:  Procedure Laterality Date  . CHOLECYSTECTOMY N/A 04/24/2014   Procedure: LAPAROSCOPIC CHOLECYSTECTOMY;  Surgeon: Harl Bowie, MD;  Location: [facility];  Service: General;  Laterality: N/A;  . COLONOSCOPY WITH PROPOFOL N/A 08/19/2014   Procedure: COLONOSCOPY WITH PROPOFOL;  Surgeon: Garlan Fair, MD;  Location: [facility];  Service: Endoscopy;  Laterality: N/A;  . EYE SURGERY     bil. cataract surgery with lens implants  . HERNIA REPAIR    . INNER EAR SURGERY    . 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  POLYPECTOMY  2010    removed one foot of intestine    by Dr Wynetta Emery     reports that he has never smoked. He has never used smokeless tobacco. He reports that he drinks alcohol. He reports that he does not use drugs.  Allergies  Allergen Reactions  . Codeine Other  (See Comments)    Mood changes   . Erythromycin Nausea And Vomiting  . Sulfonamide Derivatives Nausea And Vomiting  . Sulfamethoxazole Rash    History reviewed. No pertinent family history.  Prior to Admission medications   Medication Sig Start Date End Date Taking? Authorizing Provider  allopurinol (ZYLOPRIM) 100 MG tablet Take 200 mg by mouth daily. 05/22/18  Yes [provider]  Clindamycin Phosphate foam Apply 1 application topically 2 (two) times daily as needed (dermatitis).  08/23/13  Yes [provider]  COMBIGAN 0.2-0.5 % ophthalmic solution Place 1 drop into both eyes daily. 07/03/18  Yes [provider]  GENVOYA 150-150-200-10 MG TABS tablet TAKE 1 TABLET BY MOUTH ONCE DAILY WITH BREAKFAST Patient taking differently: Take 1 tablet by mouth daily.  06/04/18  Yes Michel Bickers, MD  ibuprofen (ADVIL,MOTRIN) 800 MG tablet Take 800 mg by mouth daily as needed for headache or mild pain.   Yes [provider]  ketoconazole (NIZORAL) 2 % cream Apply 1 application topically daily as needed for irritation.   Yes [provider]  latanoprost (XALATAN) 0.005 % ophthalmic solution 1 drop at bedtime.   Yes [provider]  loperamide (IMODIUM A-D) 2 MG tablet Take 2 mg by mouth every morning.    Yes [provider]  Multiple Vitamin (MULTIVITAMIN) tablet Take 1 tablet by mouth every morning.    Yes [provider]  mupirocin ointment (BACTROBAN) 2 % Place 1 application into the nose daily as needed ("bump").   Yes [provider]  pantoprazole (PROTONIX) 40 MG tablet Take 40 mg by mouth daily. 06/27/18  Yes [provider]  pregabalin (LYRICA) 50 MG capsule Take 50 mg by mouth 3 (three) times daily.   Yes [provider]  RESTASIS 0.05 % ophthalmic emulsion Place 1 drop into both eyes 2 (two) times daily. 06/11/18  Yes [provider]  saccharomyces boulardii (FLORASTOR) 250 MG capsule Take  250 mg by mouth 2 (two) times daily.   Yes [provider]  sildenafil (VIAGRA) 25 MG tablet Take 1-2 tablets by mouth as needed Patient taking differently: Take 50-75 mg by mouth as needed for erectile dysfunction. Take 1-2 tablets by mouth as needed 12/15/14  Yes Michel Bickers, MD  tamsulosin (FLOMAX) 0.4 MG CAPS capsule Take 0.4 mg by mouth every morning.    Yes [provider]  carboxymethylcellulose (REFRESH TEARS) 0.5 % SOLN Place 1 drop into both eyes 3 (three) times daily as needed (dry eyes).    [provider]    Physical Exam:  Constitutional: Elderly male who appears to be in some moderate discomfort Vitals:   07/03/18 1749 07/03/18 1800 07/03/18 1850 07/03/18 1930  BP: 136/87 129/89 (!) 142/83 129/85  Pulse: 90 86 80 88  Resp: 17 17 18    Temp:      TempSrc:      SpO2: 95% 94% 96% 92%  Weight:      Height:       Eyes: PERRL, lids and conjunctivae normal ENMT: Mucous membranes are dry. Posterior pharynx clear of any exudate or lesions. Nasogastric tube in place.   Neck: normal, supple,  no masses, no thyromegaly Respiratory: clear to auscultation bilaterally, no wheezing, no crackles. Normal respiratory effort. No accessory muscle use.  Cardiovascular: Regular rate and rhythm, no murmurs / rubs / gallops. No extremity edema. 2+ pedal pulses. No carotid bruits.  Abdomen: Distended abdomen with decreased bowel sounds. Musculoskeletal: no clubbing / cyanosis. No joint deformity upper and lower extremities. Good ROM, no contractures. Normal muscle tone.  Skin: no rashes, lesions, ulcers. No induration Neurologic: CN 2-12 grossly intact. Sensation intact, DTR normal. Strength 5/5 in all 4.  Psychiatric: Normal judgment and insight. Alert and oriented x 3. Normal mood.     Labs on Admission: I have personally reviewed following labs and imaging studies  CBC: Recent Labs  Lab 07/03/18 1635  WBC 7.8  HGB 16.3  HCT 45.4  MCV 87.3  PLT 161    Basic Metabolic Panel: Recent Labs  Lab 07/03/18 1635  NA 143  K 3.9  CL 104  CO2 27  GLUCOSE 140*  BUN 17  CREATININE 1.29*  CALCIUM 10.3   GFR: Estimated Creatinine Clearance: 55 mL/min (A) (by C-G formula based on SCr of 1.29 mg/dL (H)). Liver Function Tests: Recent Labs  Lab 07/03/18 1635  AST 36  ALT 46*  ALKPHOS 77  BILITOT 2.0*  PROT 8.6*  ALBUMIN 5.3*   Recent Labs  Lab 07/03/18 1635  LIPASE 33   No results for input(s): AMMONIA in the last 168 hours. Coagulation Profile: No results for input(s): INR, PROTIME in the last 168 hours. Cardiac Enzymes: No results for input(s): CKTOTAL, CKMB, CKMBINDEX, TROPONINI in the last 168 hours. BNP (last 3 results) No results for input(s): PROBNP in the last 8760 hours. HbA1C: No results for input(s): HGBA1C in the last 72 hours. CBG: No results for input(s): GLUCAP in the last 168 hours. Lipid Profile: No results for input(s): CHOL, HDL, LDLCALC, TRIG, CHOLHDL, LDLDIRECT in the last 72 hours. Thyroid Function Tests: No results for input(s): TSH, T4TOTAL, FREET4, T3FREE, THYROIDAB in the last 72 hours. Anemia Panel: No results for input(s): VITAMINB12, FOLATE, FERRITIN, TIBC, IRON, RETICCTPCT in the last 72 hours. Urine analysis:    Component Value Date/Time   COLORURINE YELLOW 04/23/2014 2258   APPEARANCEUR CLEAR 04/23/2014 2258   LABSPEC 1.019 04/23/2014 2258   PHURINE 6.5 04/23/2014 2258   GLUCOSEU NEGATIVE 04/23/2014 2258   HGBUR NEGATIVE 04/23/2014 2258   BILIRUBINUR NEGATIVE 04/23/2014 2258   KETONESUR NEGATIVE 04/23/2014 2258   PROTEINUR 30 (A) 04/23/2014 2258   UROBILINOGEN 0.2 04/23/2014 2258   NITRITE NEGATIVE 04/23/2014 2258   LEUKOCYTESUR NEGATIVE 04/23/2014 2258   Sepsis Labs: No results found for this or any previous visit (from the past 240 hour(s)).   Radiological Exams on Admission: Ct Abdomen Pelvis W Contrast  Result Date: 07/03/2018 CLINICAL DATA:  Acute generalized abdominal  pain. EXAM: CT ABDOMEN AND PELVIS WITH CONTRAST TECHNIQUE: Multidetector CT imaging of the abdomen and pelvis was performed using the standard protocol following bolus administration of intravenous contrast. CONTRAST:  15mL ISOVUE-300 IOPAMIDOL (ISOVUE-300) INJECTION 61% COMPARISON:  CT scan of April 24, 2014. FINDINGS: Lower chest: No acute abnormality. Hepatobiliary: No focal liver abnormality is seen. Status post cholecystectomy. No biliary dilatation. Pancreas: Unremarkable. No pancreatic ductal dilatation or surrounding inflammatory changes. Spleen: Normal in size without focal abnormality. Adrenals/Urinary Tract: Adrenal glands appear normal. Bilateral simple renal cysts are noted. No hydronephrosis or renal obstruction is noted. No renal or ureteral calculi are noted. Urinary bladder is unremarkable. 2.1 cm rounded enhancing abnormality is  seen in midpole of left kidney which which is concerning for neoplasm. Stomach/Bowel: The stomach appears normal. The appendix appears normal. No colonic dilatation is noted. Mildly dilated small bowel loops are noted in the central portion of the abdomen which may represent focal ileus. Vascular/Lymphatic: No significant vascular findings are present. No enlarged abdominal or pelvic lymph nodes. Reproductive: Prostate is unremarkable. Other: No abdominal wall hernia or abnormality. No abdominopelvic ascites. Musculoskeletal: No acute or significant osseous findings. IMPRESSION: Mildly dilated small bowel loops are noted in central portion of abdomen concerning for possible ileus or less likely small bowel obstruction. Continued radiographic follow-up is recommended. 2.1 cm rounded enhancing abnormality seen in the midpole cortex of the left kidney which is concerning for possible neoplasm or malignancy. MRI is recommended for further evaluation. Electronically Signed   By: Marijo Conception, M.D.   On: 07/03/2018 19:02   Dg Abd 2 Views  Result Date: 07/03/2018 CLINICAL  DATA:  Abdominal pain with nausea and vomiting EXAM: ABDOMEN - 2 VIEW COMPARISON:  None. FINDINGS: Scattered large and small bowel gas is noted. Multiple dilated loops of small bowel with differential air-fluid levels are seen consistent with at least a partial small bowel obstruction. No free air is noted. No acute bony abnormality is seen. No mass lesion is noted. IMPRESSION: Changes consistent with a least partial small bowel obstruction. CT is recommended for further evaluation. These results will be called to the ordering clinician or representative by the Radiologist Assistant, and communication documented in the PACS or zVision Dashboard. Electronically Signed   By: Inez Catalina M.D.   On: 07/03/2018 15:16    Acute abdominal series: Independently reviewed.  Multiple dilated loops of small bowel present concerning for small bowel obstruction.  Assessment/Plan Illeus vs. partial small bowel obstruction: Acute.  Patient presented with abdominal pain and distention.  CT scan of the abdomen and pelvis reveal signs of ileus versus small bowel obstruction.  Risk factors include previous cholecystectomy and umbilical hernia repair. NG tube ordered. - Admit to MedSurg bed - Small bowel protocol initiated - N.p.o. - NGT to low intermittent suction - IV Fluids NS at 75 ml/h as tolerated - Fentanyl IV as needed pain   HIV: Patient's last viral load was noted to be undetectable and CD4 count 330 on 9/11.  Currently on [place].  - Restart oral medications when medically appropriate  Chronic kidney disease stage III: Stable.  Patient appears to creatinine at 1.29 which appears near his baseline creatinine of 1.2-1.3. 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 - Check MRI of the abdomen w/ and w/o contrast  Elevated liver enzymes: Acute on chronic.  Patient presents with mild elevations in ALT and total bilirubin as seen previously in the past.  -  Continue to follow  GERD - Pepcid IV DVT prophylaxis: Lovenox Code Status: Full  Family Communication: Discussed plan of care with the patient and family present at bedside Disposition Plan: Likely discharge home once medically stable Consults called: Surgery Admission status: observation  Norval Morton MD [facility] Pager [phone]   If 7PM-7AM, please contact night-coverage [email or web address] Password [email or web address]  07/03/2018, 8:16 PM

## 2018-07-03 NOTE — ED Notes (Signed)
Bed: WA20 Expected date:  Expected time:  Means of arrival:  Comments: Hold for triage 1

## 2018-07-03 NOTE — ED Provider Notes (Signed)
Medical screening examination/treatment/procedure(s) were conducted as a shared visit with non-physician practitioner(s) and myself.  I personally evaluated the patient during the encounter.  Pt presented to the ED with nausea, vomiting, and abdominal bloating.  CT scan demonstrates ileus vs obstruction.  Discussed findings with patient.  He is still very uncomfortable with the abdominal distension.  Will order an NG tube.  Plan on admission.   Dorie Rank, MD 07/03/18 2011

## 2018-07-03 NOTE — ED Provider Notes (Signed)
[facility] DEPT Provider Note   CSN: [id number] Arrival date & time: 07/03/18  1602     History   Chief Complaint Chief Complaint  Patient presents with  . Abdominal Pain  . abdominal distention    HPI Brett Sims is a 72 y.o. male with a history of HIV (last CD4 330), prior cholecystectomy and umbilical hernia repair who presents emergency department today with generalized abdominal pain.  Patient reports that 2 days ago he started having generalized abdominal pain with associated nausea, vomiting and diarrhea.  He reports that his symptoms been constant since onset.  He notes there is no localized area of pain.  He is tried ibuprofen for his symptoms without any relief.  He reports that his emesis is nonbilious, nonbloody in nature with his last episode 1 hour ago.  He reports his diarrhea is non-melanous, nonbloody with the last episode this morning.  He was seen by his PCP at [facility] run x-ray was done and had concerns for a SBO so he was sent over for further evaluation.  Patient is noted to have a distended abdomen.  He denies any alcohol use or history of liver disease.  He denies any fever, chills, chest pain, shortness of breath, cough, urinary symptoms, painful bowel movements, recent travel, contact with others with similar symptoms or recent antibiotic use.  No urinary symptoms.  He does note that prior to the onset of his symptoms he did exercise a great deal at the gym the day before and felt some abdominal discomfort.  HPI  Past Medical History:  Diagnosis Date  . Cholecystolithiasis   . Complication of anesthesia   . GERD (gastroesophageal reflux disease)   . HIV disease ([place])   . PONV (postoperative nausea and vomiting)     Patient Active Problem List   Diagnosis Date Noted  . Glaucoma 07/06/2017  . Renal insufficiency 08/03/2016  . Peripheral neuropathy 01/05/2016  . Gout 07/07/2015  . Erectile dysfunction 05/20/2014  .  Cholecystitis with cholelithiasis 04/24/2014  . HYPERGLYCEMIA 04/09/2009  . HYPERLIPIDEMIA 12/31/2007  . Human immunodeficiency virus (HIV) disease ([place]) 06/12/2007  . GERD 06/12/2007  . BENIGN PROSTATIC HYPERTROPHY 06/12/2007  . PNEUMONIA, HX OF 06/12/2007  . 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 CHOLECYSTECTOMY N/A 04/24/2014   Procedure: LAPAROSCOPIC CHOLECYSTECTOMY;  Surgeon: Harl Bowie, MD;  Location: [place];  Service: General;  Laterality: N/A;  . COLONOSCOPY WITH PROPOFOL N/A 08/19/2014   Procedure: COLONOSCOPY WITH PROPOFOL;  Surgeon: Garlan Fair, MD;  Location: [facility];  Service: Endoscopy;  Laterality: N/A;  . EYE SURGERY     bil. cataract surgery with lens implants  . HERNIA REPAIR    . INNER EAR SURGERY    . 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 POLYPECTOMY  2010    removed one foot of intestine    by Dr Wynetta Emery        Home Medications    Prior to Admission medications   Medication Sig Start Date End Date Taking? Authorizing Provider  allopurinol (ZYLOPRIM) 300 MG tablet Take 300 mg by mouth daily.   Yes [provider]  Clindamycin Phosphate foam Apply 1 application topically 2 (two) times daily as needed (dermatitis).  08/23/13  Yes [provider]  GENVOYA 150-150-200-10 MG TABS tablet TAKE 1 TABLET BY MOUTH ONCE DAILY WITH BREAKFAST 06/04/18  Yes Michel Bickers, MD  ibuprofen (  ADVIL,MOTRIN) 800 MG tablet Take 800 mg by mouth daily as needed for headache or mild pain.   Yes [provider]  ketoconazole (NIZORAL) 2 % cream Apply 1 application topically daily as needed for irritation.   Yes [provider]  latanoprost (XALATAN) 0.005 % ophthalmic solution 1 drop at bedtime.   Yes [provider]  loperamide (IMODIUM A-D) 2 MG tablet Take 2 mg by mouth every morning.    Yes [provider]  Multiple Vitamin (MULTIVITAMIN) tablet Take  1 tablet by mouth every morning.    Yes [provider]  mupirocin ointment (BACTROBAN) 2 % Place 1 application into the nose daily as needed ("bump").   Yes [provider]  pregabalin (LYRICA) 50 MG capsule Take 50 mg by mouth 3 (three) times daily.   Yes [provider]  saccharomyces boulardii (FLORASTOR) 250 MG capsule Take 250 mg by mouth 2 (two) times daily.   Yes [provider]  sildenafil (VIAGRA) 25 MG tablet Take 1-2 tablets by mouth as needed Patient taking differently: Take 50-75 mg by mouth as needed for erectile dysfunction. Take 1-2 tablets by mouth as needed 12/15/14  Yes Michel Bickers, MD  tamsulosin (FLOMAX) 0.4 MG CAPS capsule Take 0.4 mg by mouth every morning.    Yes [provider]  acetaminophen (TYLENOL) 500 MG tablet Take 500-1,000 mg by mouth every 8 (eight) hours as needed for mild pain or headache.    [provider]  B COMPLEX VITAMINS PO Take by mouth.    [provider]  carboxymethylcellulose (REFRESH TEARS) 0.5 % SOLN Place 1 drop into both eyes 3 (three) times daily as needed (dry eyes).    [provider]  RABEprazole (ACIPHEX) 20 MG tablet Take 20 mg by mouth every morning.     [provider]    Family History History reviewed. No pertinent family history.  Social History Social History   Tobacco Use  . Smoking status: Never Smoker  . Smokeless tobacco: Never Used  Substance Use Topics  . Alcohol use: Yes    Comment: occassionally  . Drug use: No     Allergies   Codeine; Erythromycin; Sulfonamide derivatives; and Sulfamethoxazole   Review of Systems Review of Systems  All other systems reviewed and are negative.    Physical Exam Updated Vital Signs BP (!) 158/94 (BP Location: Left Arm)   Pulse 90   Temp 98.6 F (37 C) (Oral)   Resp 18   Ht 5\' 10"  (1.778 m)   Wt 83.9 kg   SpO2 97%   BMI 26.54 kg/m   Physical Exam  Constitutional: He appears  well-developed and well-nourished.  HENT:  Head: Normocephalic and atraumatic.  Right Ear: External ear normal.  Left Ear: External ear normal.  Nose: Nose normal.  Mouth/Throat: Uvula is midline, oropharynx is clear and moist and mucous membranes are normal. No tonsillar exudate.  Eyes: Pupils are equal, round, and reactive to light. Right eye exhibits no discharge. Left eye exhibits no discharge. No scleral icterus.  Neck: Trachea normal. Neck supple. No spinous process tenderness present. No neck rigidity. Normal range of motion present.  Cardiovascular: Normal rate, regular rhythm and intact distal pulses.  No murmur heard. Pulses:      Radial pulses are 2+ on the right side, and 2+ on the left side.       Dorsalis pedis pulses are 2+ on the right side, and 2+ on the left side.  Posterior tibial pulses are 2+ on the right side, and 2+ on the left side.  Pulmonary/Chest: Effort normal and breath sounds normal. He exhibits no tenderness.  Abdominal: Soft. Bowel sounds are normal. He exhibits distension. There is generalized tenderness. There is no rigidity, no rebound, no guarding and no CVA tenderness. A hernia is present. Hernia confirmed positive in the ventral area ( reducible).  Musculoskeletal: He exhibits no edema.  Lymphadenopathy:    He has no cervical adenopathy.  Neurological: He is alert.  Skin: Skin is warm and dry. No rash noted. He is not diaphoretic.  Psychiatric: He has a normal mood and affect.  Nursing note and vitals reviewed.    ED Treatments / Results  Labs (all labs ordered are listed, but only abnormal results are displayed) Labs Reviewed  COMPREHENSIVE METABOLIC PANEL - Abnormal; Notable for the following components:      Result Value   Glucose, Bld 140 (*)    Creatinine, Ser 1.29 (*)    Total Protein 8.6 (*)    Albumin 5.3 (*)    ALT 46 (*)    Total Bilirubin 2.0 (*)    GFR calc non Af Amer 55 (*)    All other components within normal limits    LIPASE, BLOOD  CBC  URINALYSIS, ROUTINE W REFLEX MICROSCOPIC  CBC  BASIC METABOLIC PANEL    EKG None  Radiology Ct Abdomen Pelvis W Contrast  Result Date: 07/03/2018 CLINICAL DATA:  Acute generalized abdominal pain. EXAM: CT ABDOMEN AND PELVIS WITH CONTRAST TECHNIQUE: Multidetector CT imaging of the abdomen and pelvis was performed using the standard protocol following bolus administration of intravenous contrast. CONTRAST:  163mL ISOVUE-300 IOPAMIDOL (ISOVUE-300) INJECTION 61% COMPARISON:  CT scan of April 24, 2014. FINDINGS: Lower chest: No acute abnormality. Hepatobiliary: No focal liver abnormality is seen. Status post cholecystectomy. No biliary dilatation. Pancreas: Unremarkable. No pancreatic ductal dilatation or surrounding inflammatory changes. Spleen: Normal in size without focal abnormality. Adrenals/Urinary Tract: Adrenal glands appear normal. Bilateral simple renal cysts are noted. No hydronephrosis or renal obstruction is noted. No renal or ureteral calculi are noted. Urinary bladder is unremarkable. 2.1 cm rounded enhancing abnormality is seen in midpole of left kidney which which is concerning for neoplasm. Stomach/Bowel: The stomach appears normal. The appendix appears normal. No colonic dilatation is noted. Mildly dilated small bowel loops are noted in the central portion of the abdomen which may represent focal ileus. Vascular/Lymphatic: No significant vascular findings are present. No enlarged abdominal or pelvic lymph nodes. Reproductive: Prostate is unremarkable. Other: No abdominal wall hernia or abnormality. No abdominopelvic ascites. Musculoskeletal: No acute or significant osseous findings. IMPRESSION: Mildly dilated small bowel loops are noted in central portion of abdomen concerning for possible ileus or less likely small bowel obstruction. Continued radiographic follow-up is recommended. 2.1 cm rounded enhancing abnormality seen in the midpole cortex of the left kidney  which is concerning for possible neoplasm or malignancy. MRI is recommended for further evaluation. Electronically Signed   By: Marijo Conception, M.D.   On: 07/03/2018 19:02   Dg Abd 2 Views  Result Date: 07/03/2018 CLINICAL DATA:  Abdominal pain with nausea and vomiting EXAM: ABDOMEN - 2 VIEW COMPARISON:  None. FINDINGS: Scattered large and small bowel gas is noted. Multiple dilated loops of small bowel with differential air-fluid levels are seen consistent with at least a partial small bowel obstruction. No free air is noted. No acute bony abnormality is seen. No mass lesion is noted. IMPRESSION: Changes consistent  with a least partial small bowel obstruction. CT is recommended for further evaluation. These results will be called to the ordering clinician or representative by the Radiologist Assistant, and communication documented in the PACS or zVision Dashboard. Electronically Signed   By: Inez Catalina M.D.   On: 07/03/2018 15:16   Dg Abd Portable 1v-small Bowel Protocol-position Verification  Result Date: 07/03/2018 CLINICAL DATA:  Nasogastric tube placement EXAM: PORTABLE ABDOMEN - 1 VIEW COMPARISON:  None. FINDINGS: The tip and side port of the nasogastric tube project over the stomach. IMPRESSION: NG tube tip and side-port in the stomach. Electronically Signed   By: Ulyses Jarred M.D.   On: 07/03/2018 21:21    Procedures Procedures (including critical care time)  Medications Ordered in ED Medications  fentaNYL (SUBLIMAZE) injection 50 mcg (has no administration in time range)  ondansetron (ZOFRAN) injection 4 mg (has no administration in time range)  sodium chloride 0.9 % bolus 1,000 mL (has no administration in time range)     Initial Impression / Assessment and Plan / ED Course  I have reviewed the triage vital signs and the nursing notes.  Pertinent labs & imaging results that were available during my care of the patient were reviewed by me and considered in my medical decision  making (see chart for details).     72 y.o. male presenting with generalized abdominal pain, nausea/vomiting/diarrhea since yesterday.  He was seen by his PCP who had an x-ray done that showed possible small bowel obstruction.  He was sent over for further evaluation.  Patient vital signs are reassuring on presentation.  Patient is noted to have generalized abdominal pain without any focal tenderness or peritoneal signs.  He is noted to have distention.  There is a ventral hernia that is noted above the umbilicus but this is reducible did not believe this is the cause of the patient's pain.  Pain medication, fluids, labs and CT ordered.  Lab work is reassuring.  Patient without leukocytosis.  Labs otherwise at patient's baseline.  CT scan with ileus versus SBO.  Patient is with continued distention will give NG tube.  NG tube given with improvement of patient's symptoms.  I discussed findings of a 2.1 cm rounded enhancing abnormality of the left kidney that would likely need MRI for further evaluation. There is concern of neoplasm/malignancy. Patient states understanding.  Hospitalist to admit the patient.  Dr. Tamala Julian of the hospitalist service is requested to consult surgery.  I was discussed this with Dr. Lucia Gaskins who is on board and aware of the patient.  I appreciate the hospitalist read the patient into the hospitalist service.  Patient appears safe for admission.  Patient case discussed with Dr. Tomi Bamberger who is in agreement with plan.  Final Clinical Impressions(s) / ED Diagnoses   Final diagnoses:  SBO (small bowel obstruction) ([place])  Abnormal CT scan, kidney    ED Discharge Orders    None       Lorelle Gibbs 07/03/18 2214    Dorie Rank, MD 07/06/18 1313

## 2018-07-03 NOTE — ED Notes (Signed)
Per Dr Delene Ruffini office, patient had an abdominal x-ray today-showed a small bowel obstruction

## 2018-07-03 NOTE — Progress Notes (Addendum)
Pt has had NGT set up to low intermittent wall suction for 2 hours. Gastrografin given, NGT will be  Clamped for 1h.  [name], RN   0100: pt NGT flushed and patent, set up on low intermittent wall suction. No complaints, pt resting comfortably.  [name], RN

## 2018-07-03 NOTE — ED Triage Notes (Addendum)
Patient c/o abdominal pain and distention since yesterday. Patient was instructed to come to the ED to r/o SBO.

## 2018-07-03 NOTE — ED Notes (Signed)
Hospitalist at bedside 

## 2018-07-04 ENCOUNTER — Encounter: Payer: Self-pay | Admitting: Internal Medicine

## 2018-07-04 ENCOUNTER — Inpatient Hospital Stay (HOSPITAL_COMMUNITY): Payer: Medicare PPO

## 2018-07-04 ENCOUNTER — Observation Stay (HOSPITAL_COMMUNITY): Payer: Medicare PPO

## 2018-07-04 DIAGNOSIS — N2889 Other specified disorders of kidney and ureter: Secondary | ICD-10-CM | POA: Diagnosis present

## 2018-07-04 DIAGNOSIS — R93429 Abnormal radiologic findings on diagnostic imaging of unspecified kidney: Secondary | ICD-10-CM

## 2018-07-04 DIAGNOSIS — N400 Enlarged prostate without lower urinary tract symptoms: Secondary | ICD-10-CM | POA: Diagnosis present

## 2018-07-04 DIAGNOSIS — Z9842 Cataract extraction status, left eye: Secondary | ICD-10-CM | POA: Diagnosis not present

## 2018-07-04 DIAGNOSIS — K529 Noninfective gastroenteritis and colitis, unspecified: Secondary | ICD-10-CM | POA: Diagnosis present

## 2018-07-04 DIAGNOSIS — K56609 Unspecified intestinal obstruction, unspecified as to partial versus complete obstruction: Secondary | ICD-10-CM | POA: Diagnosis not present

## 2018-07-04 DIAGNOSIS — R748 Abnormal levels of other serum enzymes: Secondary | ICD-10-CM | POA: Diagnosis not present

## 2018-07-04 DIAGNOSIS — K760 Fatty (change of) liver, not elsewhere classified: Secondary | ICD-10-CM | POA: Diagnosis present

## 2018-07-04 DIAGNOSIS — Z9049 Acquired absence of other specified parts of digestive tract: Secondary | ICD-10-CM | POA: Diagnosis not present

## 2018-07-04 DIAGNOSIS — K567 Ileus, unspecified: Principal | ICD-10-CM

## 2018-07-04 DIAGNOSIS — G629 Polyneuropathy, unspecified: Secondary | ICD-10-CM | POA: Diagnosis present

## 2018-07-04 DIAGNOSIS — Z961 Presence of intraocular lens: Secondary | ICD-10-CM | POA: Diagnosis present

## 2018-07-04 DIAGNOSIS — Z888 Allergy status to other drugs, medicaments and biological substances status: Secondary | ICD-10-CM | POA: Diagnosis not present

## 2018-07-04 DIAGNOSIS — Z9841 Cataract extraction status, right eye: Secondary | ICD-10-CM | POA: Diagnosis not present

## 2018-07-04 DIAGNOSIS — K439 Ventral hernia without obstruction or gangrene: Secondary | ICD-10-CM | POA: Diagnosis present

## 2018-07-04 DIAGNOSIS — N183 Chronic kidney disease, stage 3 unspecified: Secondary | ICD-10-CM | POA: Diagnosis present

## 2018-07-04 DIAGNOSIS — Z885 Allergy status to narcotic agent status: Secondary | ICD-10-CM | POA: Diagnosis not present

## 2018-07-04 DIAGNOSIS — H409 Unspecified glaucoma: Secondary | ICD-10-CM | POA: Diagnosis present

## 2018-07-04 DIAGNOSIS — Z882 Allergy status to sulfonamides status: Secondary | ICD-10-CM | POA: Diagnosis not present

## 2018-07-04 DIAGNOSIS — Z79899 Other long term (current) drug therapy: Secondary | ICD-10-CM | POA: Diagnosis not present

## 2018-07-04 DIAGNOSIS — Z85828 Personal history of other malignant neoplasm of skin: Secondary | ICD-10-CM | POA: Diagnosis not present

## 2018-07-04 DIAGNOSIS — K219 Gastro-esophageal reflux disease without esophagitis: Secondary | ICD-10-CM | POA: Diagnosis present

## 2018-07-04 DIAGNOSIS — Z9621 Cochlear implant status: Secondary | ICD-10-CM | POA: Diagnosis present

## 2018-07-04 DIAGNOSIS — E785 Hyperlipidemia, unspecified: Secondary | ICD-10-CM | POA: Diagnosis present

## 2018-07-04 DIAGNOSIS — Z881 Allergy status to other antibiotic agents status: Secondary | ICD-10-CM | POA: Diagnosis not present

## 2018-07-04 DIAGNOSIS — B20 Human immunodeficiency virus [HIV] disease: Secondary | ICD-10-CM | POA: Diagnosis present

## 2018-07-04 DIAGNOSIS — D3002 Benign neoplasm of left kidney: Secondary | ICD-10-CM | POA: Diagnosis present

## 2018-07-04 DIAGNOSIS — Z0189 Encounter for other specified special examinations: Secondary | ICD-10-CM

## 2018-07-04 LAB — CBC
HEMATOCRIT: 38 % — AB (ref 39.0–52.0)
Hemoglobin: 13.4 g/dL (ref 13.0–17.0)
MCH: 31.1 pg (ref 26.0–34.0)
MCHC: 35.3 g/dL (ref 30.0–36.0)
MCV: 88.2 fL (ref 78.0–100.0)
PLATELETS: 122 10*3/uL — AB (ref 150–400)
RBC: 4.31 MIL/uL (ref 4.22–5.81)
RDW: 14.2 % (ref 11.5–15.5)
WBC: 5.6 10*3/uL (ref 4.0–10.5)

## 2018-07-04 LAB — COMPREHENSIVE METABOLIC PANEL
ALT: 34 U/L (ref 0–44)
AST: 25 U/L (ref 15–41)
Albumin: 4 g/dL (ref 3.5–5.0)
Alkaline Phosphatase: 58 U/L (ref 38–126)
Anion gap: 6 (ref 5–15)
BUN: 17 mg/dL (ref 8–23)
CO2: 28 mmol/L (ref 22–32)
CREATININE: 1.11 mg/dL (ref 0.61–1.24)
Calcium: 9.3 mg/dL (ref 8.9–10.3)
Chloride: 110 mmol/L (ref 98–111)
GFR calc Af Amer: >60 mL/min (ref >60)
GLUCOSE: 139 mg/dL — AB (ref 70–99)
Potassium: 3.8 mmol/L (ref 3.5–5.1)
Sodium: 144 mmol/L (ref 135–145)
Total Bilirubin: 1.4 mg/dL — ABNORMAL HIGH (ref 0.3–1.2)
Total Protein: 6.7 g/dL (ref 6.5–8.1)

## 2018-07-04 MED ORDER — BRIMONIDINE TARTRATE 0.2 % OP SOLN
1.0000 [drp] | Freq: Every day | OPHTHALMIC | Status: DC
  Administered 2018-07-04 – 2018-07-06 (×3): 1 [drp] via OPHTHALMIC
  Filled 2018-07-04: qty 5

## 2018-07-04 MED ORDER — PHENOL 1.4 % MT LIQD
1.0000 | OROMUCOSAL | Status: DC | PRN
  Filled 2018-07-04: qty 177

## 2018-07-04 MED ORDER — TIMOLOL MALEATE 0.5 % OP SOLN
1.0000 [drp] | Freq: Every day | OPHTHALMIC | Status: DC
  Administered 2018-07-04 – 2018-07-06 (×3): 1 [drp] via OPHTHALMIC
  Filled 2018-07-04: qty 5

## 2018-07-04 MED ORDER — LATANOPROST 0.005 % OP SOLN
1.0000 [drp] | Freq: Every day | OPHTHALMIC | Status: DC
  Administered 2018-07-04 – 2018-07-05 (×2): 1 [drp] via OPHTHALMIC
  Filled 2018-07-04: qty 2.5

## 2018-07-04 MED ORDER — IOHEXOL 300 MG/ML  SOLN
100.0000 mL | Freq: Once | INTRAMUSCULAR | Status: AC | PRN
  Administered 2018-07-04: 100 mL via INTRAVENOUS

## 2018-07-04 NOTE — Progress Notes (Signed)
MRI called to confirm no implants present from a previous inner ear surgery. Pt is unable to confirm this information. MRI waiting for hospital's medical records to open to confirm before proceeding w/ ordered MRI.  [name], RN

## 2018-07-04 NOTE — Progress Notes (Signed)
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 Per Dr Jobe Igo, canel MRI , do CT Renal Proto instead   Spoke to Rahj (sp?) RN, she will  inform MD of need to order CT 10:54a

## 2018-07-04 NOTE — Progress Notes (Addendum)
PROGRESS NOTE  Brett Sims TJQ:[id number] DOB: 11/23/1945 DOA: 07/03/2018 PCP: [name], MD  HPI/Recap of past 24 hours:  Brett Sims is a 72 y.o. male with medical history significant of HIV with neuropathy and GERD; who presents with complaints of constant generalized abdominal pain and distention over the last 2 days.  Patient reports having associated symptoms of 10-12 episodes of diarrhea.  Diarrhea has had no blood in it and appeared yellowish-brownish in color.  He also reported having several episodes of vomiting.  07/04/2018: Patient seen and examined his bedside.  He reports persistent diarrhea with watery stools.  C. difficile PCR pending.  NG tube to suction with moderate amount of fluid collected.  Assessment/Plan: Principal Problem:   Ileus ([place]) Active Problems:   HIV (human immunodeficiency virus infection) ([name])   GERD   Renal mass   CKD (chronic kidney disease), stage III ([facility])   Elevated liver enzymes  Severe abdominal pain suspect secondary to ileus versus partial small bowel obstruction Patient continues to have diarrhea with multiple watery bowel movements Awaiting results of C. difficile PCR Continue IV fluid to avoid dehydration N.p.o. 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  Also, persistent body fluid drainage from NG tube in the setting of ileus and suspected partial small bowel obstruction in an elderly patient with HIV.  Code Status:  Full code  Family Communication: None at bedside  Disposition Plan: Home possibly 1 to 2 days or when diarrhea improves   Consultants:  None  Procedures:  None  Antimicrobials:  None  DVT prophylaxis: Subcu Lovenox daily   Objective: Vitals:   07/03/18 2030 07/03/18 2108 07/03/18 2139 07/04/18 0504  BP: (!) 159/100 (!) 179/91 (!) 153/93 136/85  Pulse: 76 88 89 86  Resp: 20 16 18 19   Temp:   98.3 F (36.8 C) 98.1 F (36.7 C)  TempSrc:   Oral Oral  SpO2: 96% 98% 94% 95%  Weight:   84.4 kg   Height:   5\' 10"  (1.778 m)     Intake/Output Summary (Last 24 hours) at 07/04/2018 1342 Last data filed at 07/04/2018 1024 Gross per 24 hour  Intake 1509.11 ml  Output 302 ml  Net 1207.11 ml   Filed Weights   07/03/18 1621 07/03/18 2139  Weight: 83.9 kg 84.4 kg    Exam:  . General: 72 y.o. year-old male well developed well nourished in no acute distress.  Alert and oriented x3.  NG tube in place to suction. . Cardiovascular: Regular rate and rhythm with no rubs or gallops.  No thyromegaly or JVD noted.   Marland Kitchen Respiratory: Clear to auscultation with no wheezes or rales. Good inspiratory effort. . Abdomen: Mildly distended abdomen with normal bowel sounds x4 quadrants. . Musculoskeletal: No lower extremity edema. 2/4 pulses in all 4 extremities. . Skin: No ulcerative lesions noted or rashes, . Psychiatry: Mood is appropriate for condition and setting   Data Reviewed: CBC: Recent Labs  Lab 07/03/18 1635 07/04/18 0543  WBC 7.8 5.6  HGB 16.3 13.4  HCT 45.4 38.0*  MCV 87.3 88.2  PLT 156 101*   Basic Metabolic Panel: Recent Labs  Lab 07/03/18 1635 07/04/18 0543  NA 143 144  K 3.9 3.8  CL 104 110  CO2 27 28  GLUCOSE 140* 139*  BUN 17 17  CREATININE 1.29* 1.11  CALCIUM 10.3 9.3   GFR: Estimated Creatinine Clearance: 63.9 mL/min (by C-G formula based on SCr of 1.11 mg/dL). Liver Function Tests: Recent Labs  Lab 07/03/18 1635 07/04/18 0543  AST 36 25    ALT 46* 34  ALKPHOS 77 58  BILITOT 2.0* 1.4*  PROT 8.6* 6.7  ALBUMIN 5.3* 4.0   Recent Labs  Lab 07/03/18 1635  LIPASE 33   No results for input(s): AMMONIA in the last 168 hours. Coagulation Profile: No results for input(s): INR, PROTIME in the last 168 hours. Cardiac Enzymes: No results for input(s): CKTOTAL, CKMB, CKMBINDEX, TROPONINI in the last 168 hours. BNP (last 3 results) No results for input(s): PROBNP in the last 8760 hours. HbA1C: No results for input(s): HGBA1C in the last 72 hours. CBG: No results for input(s): GLUCAP in the last 168 hours. Lipid Profile: No results for input(s): CHOL, HDL, LDLCALC, TRIG, CHOLHDL, LDLDIRECT in the last 72 hours. Thyroid Function Tests: No results for input(s): TSH, T4TOTAL, FREET4, T3FREE, THYROIDAB in the last 72 hours. Anemia Panel: No results for input(s): VITAMINB12, FOLATE, FERRITIN, TIBC, IRON, RETICCTPCT in the last 72 hours. Urine analysis:    Component Value Date/Time   COLORURINE YELLOW 04/23/2014 [place] 04/23/2014 2258   LABSPEC 1.019 04/23/2014 2258   PHURINE 6.5 04/23/2014 2258   GLUCOSEU NEGATIVE 04/23/2014 2258   HGBUR NEGATIVE 04/23/2014 2258   BILIRUBINUR NEGATIVE 04/23/2014 2258   KETONESUR NEGATIVE 04/23/2014 2258   PROTEINUR 30 (A) 04/23/2014 2258   UROBILINOGEN 0.2 04/23/2014 2258   NITRITE NEGATIVE 04/23/2014 2258   LEUKOCYTESUR NEGATIVE 04/23/2014 2258   Sepsis Labs: @LABRCNTIP (procalcitonin:4,lacticidven:4)  )No results found for this or any previous visit (from the past 240 hour(s)).    Studies: Ct Abdomen Pelvis W Contrast  Result Date: 07/03/2018 CLINICAL DATA:  Acute generalized abdominal pain. EXAM: CT ABDOMEN AND PELVIS WITH CONTRAST TECHNIQUE: Multidetector CT imaging of the abdomen and pelvis was performed using the standard protocol following bolus administration of intravenous contrast. CONTRAST:  132mL ISOVUE-300 IOPAMIDOL (ISOVUE-300) INJECTION 61% COMPARISON:   CT scan of April 24, 2014. FINDINGS: Lower chest: No acute abnormality. Hepatobiliary: No focal liver abnormality is seen. Status post cholecystectomy. No biliary dilatation. Pancreas: Unremarkable. No pancreatic ductal dilatation or surrounding inflammatory changes. Spleen: Normal in size without focal abnormality. Adrenals/Urinary Tract: Adrenal glands appear normal. Bilateral simple renal cysts are noted. No hydronephrosis or renal obstruction is noted. No renal or ureteral calculi are noted. Urinary bladder is unremarkable. 2.1 cm rounded enhancing abnormality is seen in midpole of left kidney which which is concerning for neoplasm. Stomach/Bowel: The stomach appears normal. The appendix appears normal. No colonic dilatation is noted. Mildly dilated small bowel loops are noted in the central portion of the abdomen which may represent focal ileus. Vascular/Lymphatic: No significant vascular findings are present. No enlarged abdominal or pelvic lymph nodes. Reproductive: Prostate is unremarkable. Other: No abdominal wall hernia or abnormality. No abdominopelvic ascites. Musculoskeletal: No acute or significant osseous findings. IMPRESSION: Mildly dilated small bowel loops are noted in central portion of abdomen concerning for possible ileus or less likely small bowel obstruction. Continued radiographic follow-up is recommended. 2.1 cm rounded enhancing abnormality seen  in the midpole cortex of the left kidney which is concerning for possible neoplasm or malignancy. MRI is recommended for further evaluation. Electronically Signed   By: Marijo Conception, M.D.   On: 07/03/2018 19:02   Dg Abd 2 Views  Result Date: 07/03/2018 CLINICAL DATA:  Abdominal pain with nausea and vomiting EXAM: ABDOMEN - 2 VIEW COMPARISON:  None. FINDINGS: Scattered large and small bowel gas is noted. Multiple dilated loops of small bowel with differential air-fluid levels are seen consistent with at least a partial small bowel obstruction.  No free air is noted. No acute bony abnormality is seen. No mass lesion is noted. IMPRESSION: Changes consistent with a least partial small bowel obstruction. CT is recommended for further evaluation. These results will be called to the ordering clinician or representative by the Radiologist Assistant, and communication documented in the PACS or zVision Dashboard. Electronically Signed   By: Inez Catalina M.D.   On: 07/03/2018 15:16   Dg Abd Portable 1v-small Bowel Obstruction Protocol-initial, 8 Hr Delay  Result Date: 07/04/2018 CLINICAL DATA:  8 hour delay film on small-bowel obstruction EXAM: PORTABLE ABDOMEN - 1 VIEW COMPARISON:  07/03/2018 FINDINGS: Scattered large and small bowel gas is seen. Mild small-bowel dilatation is again identified and stable from the prior CT examination. Administered contrast now lies throughout the colon. No bony abnormality is noted. Nasogastric catheter remains in the stomach. IMPRESSION: Administered contrast now lies throughout the colon. Mild persistent dilatation of small bowel loops most consistent with a partial small bowel obstruction. Electronically Signed   By: Inez Catalina M.D.   On: 07/04/2018 08:32   Dg Abd Portable 1v-small Bowel Protocol-position Verification  Result Date: 07/03/2018 CLINICAL DATA:  Nasogastric tube placement EXAM: PORTABLE ABDOMEN - 1 VIEW COMPARISON:  None. FINDINGS: The tip and side port of the nasogastric tube project over the stomach. IMPRESSION: NG tube tip and side-port in the stomach. Electronically Signed   By: Ulyses Jarred M.D.   On: 07/03/2018 21:21    Scheduled Meds: . brimonidine  1 drop Both Eyes Daily   And  . timolol  1 drop Both Eyes Daily  . enoxaparin (LOVENOX) injection  40 mg Subcutaneous Q24H  . latanoprost  1 drop Both Eyes QHS    Continuous Infusions: . sodium chloride 75 mL/hr at 07/04/18 0429  . famotidine (PEPCID) IV 20 mg (07/04/18 0949)     LOS: 0 days     Kayleen Memos, MD [facility] Pager [phone]  If 7PM-7AM, please contact night-coverage [email or web address] Password [email or web address] 07/04/2018, 1:42 PM

## 2018-07-05 LAB — BASIC METABOLIC PANEL
Anion gap: 5 (ref 5–15)
BUN: 15 mg/dL (ref 8–23)
CALCIUM: 8.9 mg/dL (ref 8.9–10.3)
CO2: 28 mmol/L (ref 22–32)
CREATININE: 1.18 mg/dL (ref 0.61–1.24)
Chloride: 111 mmol/L (ref 98–111)
GFR calc non Af Amer: >60 mL/min (ref >60)
Glucose, Bld: 100 mg/dL — ABNORMAL HIGH (ref 70–99)
Potassium: 3.6 mmol/L (ref 3.5–5.1)
SODIUM: 144 mmol/L (ref 135–145)

## 2018-07-05 LAB — URINALYSIS, ROUTINE W REFLEX MICROSCOPIC
Bilirubin Urine: NEGATIVE
Glucose, UA: NEGATIVE mg/dL
Hgb urine dipstick: NEGATIVE
Ketones, ur: 20 mg/dL — AB
LEUKOCYTES UA: NEGATIVE
NITRITE: NEGATIVE
PH: 5 (ref 5.0–8.0)
Protein, ur: NEGATIVE mg/dL
Specific Gravity, Urine: 1.027 (ref 1.005–1.030)

## 2018-07-05 LAB — C DIFFICILE QUICK SCREEN W PCR REFLEX
C DIFFICILE (CDIFF) INTERP: NOT DETECTED
C DIFFICLE (CDIFF) ANTIGEN: NEGATIVE
C Diff toxin: NEGATIVE

## 2018-07-05 LAB — SEDIMENTATION RATE: SED RATE: 6 mm/h (ref 0–16)

## 2018-07-05 LAB — C-REACTIVE PROTEIN: CRP: 2.1 mg/dL — AB (ref <1.0)

## 2018-07-05 MED ORDER — AMLODIPINE BESYLATE 5 MG PO TABS
5.0000 mg | ORAL_TABLET | Freq: Every day | ORAL | Status: DC
  Administered 2018-07-05: 5 mg via ORAL
  Filled 2018-07-05: qty 1

## 2018-07-05 MED ORDER — ELVITEG-COBIC-EMTRICIT-TENOFAF 150-150-200-10 MG PO TABS
1.0000 | ORAL_TABLET | Freq: Every day | ORAL | Status: DC
  Administered 2018-07-05 – 2018-07-06 (×2): 1 via ORAL
  Filled 2018-07-05 (×2): qty 1

## 2018-07-05 NOTE — Progress Notes (Signed)
Patient removed Ng tube at 0200. Refused reinsertion. approx 100 cc drainage noted at time of removal

## 2018-07-05 NOTE — Progress Notes (Signed)
We were called to consult on patient, but patient has an [facility] PCP who he has seen in clinic recently and will belong to [facility]. They have been called to consult and should be seeing the patient later today.  Please let us know if we can be of further assistance. Ellouise Newer, PA-C [facility].

## 2018-07-05 NOTE — Progress Notes (Signed)
PROGRESS NOTE  Brett Sims VOZ:[id number] DOB: [birth year] DOA: 07/03/2018 PCP: [name], MD  HPI/Recap of past 24 hours:  Brett Sims is a 72 y.o. male with medical history significant of HIV with neuropathy and GERD; who presents with complaints of constant generalized abdominal pain and distention over the last 2 days.  Patient reports having associated symptoms of 10-12 episodes of diarrhea.  Diarrhea has had no blood in it and appeared yellowish-brownish in color.  He also reported having several episodes of vomiting.  07/04/2018: Patient seen and examined his bedside.  He reports persistent diarrhea with watery stools.  C. difficile PCR pending.  NG tube to suction with moderate amount of fluid collected.  07/05/18: Patient seen and examined with his spouse at his bedside.  C. difficile negative however continues to have watery loose stools.  Diarrhea is chronic comes and goes.  On Imodium chronically.  GI consulted and will see the patient.  Assessment/Plan: Principal Problem:   Ileus ([place]) Active Problems:   HIV (human immunodeficiency virus infection) ([place])   GERD   Renal mass   CKD (chronic kidney disease), stage III (HCC)   Elevated liver enzymes  Resolved severe abdominal pain suspect secondary to ileus versus partial small bowel obstruction Patient continues to have diarrhea with multiple watery bowel movements C. 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 Curbside with oncology for follow-up outpatient  Chronic diarrhea C. difficile negative GI consulted to further assess ESR and CRP ordered Continue IV fluid hydration to avoid dehydration  HIV Last viral load undetectable CD4 count greater than  300 Continue HIV medications  CKD 2, stable Avoid nephrotoxic agents/hypotension/dehydration Monitor urine output  Risks: High risk for decompensation due to persistent diarrhea with increased risk for severe dehydration and electrolyte imbalance in an elderly patient with HIV.  Code Status: Full code  Family Communication: None at bedside  Disposition Plan: Home possibly 1 to 2 days or when diarrhea improves or when GI signs off.   Consultants:  GI  Procedures:  None  Antimicrobials:  None  DVT prophylaxis: Subcu Lovenox daily   Objective: Vitals:   07/04/18 2040 07/05/18 0530 07/05/18 1003 07/05/18 1349  BP: (!) 160/80 (!) 156/91 (!) 174/88 (!) 149/71  Pulse: 74 70  (!) 50  Resp: 16 20  17   Temp: 98.6 F (37 C) 98.1 F (36.7 C)  98.3 F (36.8 C)  TempSrc:  Oral  Oral  SpO2: 95% 96%  94%  Weight:      Height:        Intake/Output Summary (Last 24 hours) at 07/05/2018 1537 Last data filed at 07/05/2018 1000 Gross per 24 hour  Intake 3205.6 ml  Output 700 ml  Net 2505.6 ml   Filed Weights   07/03/18 1621 07/03/18 2139  Weight: 83.9 kg 84.4 kg    Exam:  . General: 72 y.o. year-old male developed well-nourished in no acute distress.  Alert and oriented x3. . Cardiovascular: Regular rate and rhythm with no rubs or gallops.  No thyromegaly or JVD noted. Marland Kitchen Respiratory: Clear to auscultation with no wheezes or rales. Good inspiratory effort. . Abdomen: Mildly distended abdomen with normal bowel sounds x4 quadrants. . Musculoskeletal: No lower extremity edema. 2/4 pulses in all 4 extremities. Marland Kitchen  Skin: No ulcerative lesions noted or rashes, . Psychiatry: Mood is appropriate for condition and setting   Data Reviewed: CBC: Recent Labs  Lab 07/03/18 1635 07/04/18 0543  WBC 7.8 5.6  HGB 16.3 13.4  HCT 45.4 38.0*  MCV 87.3 88.2  PLT 156 573*   Basic Metabolic Panel: Recent Labs  Lab 07/03/18 1635 07/04/18 0543 07/05/18 0531  NA 143 144 144  K  3.9 3.8 3.6  CL 104 110 111  CO2 27 28 28   GLUCOSE 140* 139* 100*  BUN 17 17 15   CREATININE 1.29* 1.11 1.18  CALCIUM 10.3 9.3 8.9   GFR: Estimated Creatinine Clearance: 60.1 mL/min (by C-G formula based on SCr of 1.18 mg/dL). Liver Function Tests: Recent Labs  Lab 07/03/18 1635 07/04/18 0543  AST 36 25  ALT 46* 34  ALKPHOS 77 58  BILITOT 2.0* 1.4*  PROT 8.6* 6.7  ALBUMIN 5.3* 4.0   Recent Labs  Lab 07/03/18 1635  LIPASE 33   No results for input(s): AMMONIA in the last 168 hours. Coagulation Profile: No results for input(s): INR, PROTIME in the last 168 hours. Cardiac Enzymes: No results for input(s): CKTOTAL, CKMB, CKMBINDEX, TROPONINI in the last 168 hours. BNP (last 3 results) No results for input(s): PROBNP in the last 8760 hours. HbA1C: No results for input(s): HGBA1C in the last 72 hours. CBG: No results for input(s): GLUCAP in the last 168 hours. Lipid Profile: No results for input(s): CHOL, HDL, LDLCALC, TRIG, CHOLHDL, LDLDIRECT in the last 72 hours. Thyroid Function Tests: No results for input(s): TSH, T4TOTAL, FREET4, T3FREE, THYROIDAB in the last 72 hours. Anemia Panel: No results for input(s): VITAMINB12, FOLATE, FERRITIN, TIBC, IRON, RETICCTPCT in the last 72 hours. Urine analysis:    Component Value Date/Time   COLORURINE YELLOW 07/05/2018 [place] 07/05/2018 0814   LABSPEC 1.027 07/05/2018 0814   PHURINE 5.0 07/05/2018 0814   GLUCOSEU NEGATIVE 07/05/2018 0814   HGBUR NEGATIVE 07/05/2018 0814   BILIRUBINUR NEGATIVE 07/05/2018 0814   KETONESUR 20 (A) 07/05/2018 0814   PROTEINUR NEGATIVE 07/05/2018 0814   UROBILINOGEN 0.2 04/23/2014 2258   NITRITE NEGATIVE 07/05/2018 0814   LEUKOCYTESUR NEGATIVE 07/05/2018 0814   Sepsis Labs: @LABRCNTIP (procalcitonin:4,lacticidven:4)  ) Recent Results (from the past 240 hour(s))  C difficile quick scan w PCR reflex     Status: None   Collection Time: 07/05/18  6:49 AM  Result Value Ref  Range Status   C Diff antigen NEGATIVE NEGATIVE Final   C Diff toxin NEGATIVE NEGATIVE Final   C Diff interpretation No C. difficile detected.  Final    Comment: Performed at [facility], [place] [address] [place], [place] [address]      Studies: Ct Renal Abd W/wo  Result Date: 07/04/2018 CLINICAL DATA:  Inpatient.  Suspicious left renal mass on recent CT. EXAM: CT ABDOMEN WITHOUT AND WITH CONTRAST TECHNIQUE: Multidetector CT imaging of the abdomen was performed following the standard protocol before and following the bolus administration of intravenous contrast. CONTRAST:  176m OMNIPAQUE IOHEXOL 300 MG/ML  SOLN COMPARISON:  07/03/2018 CT abdomen/pelvis. FINDINGS: Lower chest: No significant pulmonary nodules or acute consolidative airspace disease. Coarse calcified right parasagittal and right hilar nodes from prior granulomatous disease. Hepatobiliary: Diffuse hepatic steatosis. No definite liver surface irregularity. Subcentimeter hypodense segment 4A left liver lobe lesion is too small to characterize. No additional liver mass. Cholecystectomy. Bile ducts are within normal post cholecystectomy limits. Pancreas: Normal, with no mass or duct dilation. Spleen:  Mild splenomegaly (craniocaudal splenic length 15.0 cm). No splenic mass. Adrenals/Urinary Tract: Normal adrenals. No renal stones. No hydronephrosis. Avidly enhancing 2.0 x 1.8 cm renal cortical mass in the interpolar left kidney (series 11/image 71). 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 Small simple bilateral renal cysts, largest 1.9 cm in the upper right kidney. Additional subcentimeter hypodense renal cortical lesions in both kidneys, too small to characterize. Stomach/Bowel: Enteric tube terminates in the distal body of the stomach. Stomach is collapsed and otherwise normal. Mildly dilated small  bowel loops with air-fluid levels throughout the visualized abdomen measuring up to the 3.2 cm diameter, decreased from 3.7 cm. No wall thickening in the visualized bowel. Moderate left colonic diverticulosis. Oral contrast transits to the left colon. Vascular/Lymphatic: Atherosclerotic nonaneurysmal abdominal aorta. Patent portal, splenic, hepatic and renal veins. No pathologically enlarged lymph nodes in the abdomen. Other: No pneumoperitoneum. Trace perihepatic ascites. No focal fluid collection. Musculoskeletal: No aggressive appearing focal osseous lesions. Mild thoracolumbar spondylosis. IMPRESSION: 1. 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 Patent renal veins with no tumor thrombus. No evidence of metastatic disease in the abdomen. 2. Mildly dilated small bowel loops with air-fluid levels throughout the visualized abdomen, mildly improved compared to the CT study from 1 day prior. Oral contrast transits to the left colon. Findings suggest improving mild small-bowel obstruction or ileus. 3. Mild splenomegaly.  Trace perihepatic ascites. 4. Diffuse hepatic steatosis. No overt morphologic findings of hepatic cirrhosis. 5.  Aortic Atherosclerosis (ICD10-I70.0). Electronically Signed   By: Ilona Sorrel M.D.   On: 07/04/2018 19:09    Scheduled Meds: . amLODipine  5 mg Oral Daily  . brimonidine  1 drop Both Eyes Daily   And  . timolol  1 drop Both Eyes Daily  . elvitegravir-cobicistat-emtricitabine-tenofovir  1 tablet Oral Daily  . enoxaparin (LOVENOX) injection  40 mg Subcutaneous Q24H  . latanoprost  1 drop Both Eyes QHS    Continuous Infusions: . sodium chloride 75 mL/hr at 07/05/18 0450  . famotidine (PEPCID) IV 20 mg (07/05/18 1009)     LOS: 1 day     Kayleen Memos, MD [facility] Pager [phone]  If 7PM-7AM, please contact night-coverage [email or web address] Password [facility] 07/05/2018, 3:37 PM

## 2018-07-05 NOTE — Consult Note (Addendum)
Referring Provider:  [facility] Primary Care Physician:  [name], MD Primary Gastroenterologist:  Dr. Wynetta Emery   Reason for Consultation: Ileus/abdominal pain  HPI: Brett Sims is a 72 y.o. male with past medical history of HIV with neuropathy, history of GERD, history of tubulovillous adenoma status post transverse colectomy in 2007, history of laparoscopic cholecystectomy in 2015 who was initially seen by primary care physician 2 days ago for acute onset of abdominal pain.  X-ray abdomen showed possible partial small bowel obstruction and he was advised to come to emergency room for further evaluation and management.  CT scan initial evaluation showed possible mild small bowel ileus versus obstruction.  Patient was placed on NG suction.  Initial CT scan was concerning for renal neoplasm.  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  Patient is having ongoing diarrhea.  C. difficile negative, GI pathogen panel pending.  GI is consulted for further evaluation.  Patient seen and examined at bedside.  He was doing fine until 1 week ago when he started having diarrhea which was self resolving.  2 days after that he started noticing generalized abdominal pain and distention which was associate with nausea and nonbloody vomiting.  Because of ongoing symptoms he was seen by primary care physician.  He again started having diarrhea.  Currently having 6-10 bowel movements per day.  Usually he has 3-4 bowel movements loose particularly in the morning.  He denies any blood in the stool or black stool.  Last colonoscopy on August 19, 2014 by Dr. Wynetta Emery was normal.  Repeat was recommended in 5 years.  Past Medical History:  Diagnosis Date  . Cholecystolithiasis   . Complication of anesthesia   . GERD (gastroesophageal reflux disease)   . HIV disease ([place])   . PONV (postoperative nausea and vomiting)     Past Surgical History:  Procedure  Laterality Date  . CHOLECYSTECTOMY N/A 04/24/2014   Procedure: LAPAROSCOPIC CHOLECYSTECTOMY;  Surgeon: Harl Bowie, MD;  Location: [place];  Service: General;  Laterality: N/A;  . COLONOSCOPY WITH PROPOFOL N/A 08/19/2014   Procedure: COLONOSCOPY WITH PROPOFOL;  Surgeon: Garlan Fair, MD;  Location: [facility];  Service: Endoscopy;  Laterality: N/A;  . EYE SURGERY     bil. cataract surgery with lens implants  . HERNIA REPAIR    . INNER EAR SURGERY    . 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 POLYPECTOMY  2010    removed one foot of intestine    by Dr Wynetta Emery    Prior to Admission medications   Medication Sig Start Date End Date Taking? Authorizing Provider  allopurinol (ZYLOPRIM) 100 MG tablet Take 200 mg by mouth daily. 05/22/18  Yes [provider]  Clindamycin Phosphate foam Apply 1 application topically 2 (two) times daily as needed (dermatitis).  08/23/13  Yes [provider]  COMBIGAN 0.2-0.5 % ophthalmic solution Place 1 drop into both eyes daily. 07/03/18  Yes [provider]  GENVOYA 150-150-200-10 MG TABS tablet TAKE 1 TABLET BY MOUTH ONCE DAILY WITH BREAKFAST Patient taking differently: Take 1 tablet by mouth daily.  06/04/18  Yes Michel Bickers, MD  ibuprofen (ADVIL,MOTRIN) 800 MG tablet Take 800 mg by mouth daily as needed for headache or mild pain.   Yes [provider]  ketoconazole (NIZORAL) 2 % cream Apply 1 application topically daily as needed for irritation.   Yes [provider]  latanoprost (XALATAN) 0.005 % ophthalmic  solution 1 drop at bedtime.   Yes [provider]  loperamide (IMODIUM A-D) 2 MG tablet Take 2 mg by mouth every morning.    Yes [provider]  Multiple Vitamin (MULTIVITAMIN) tablet Take 1 tablet by mouth every morning.    Yes [provider]  mupirocin ointment (BACTROBAN) 2 % Place 1 application into the nose daily as needed ("bump").   Yes [provider]   pantoprazole (PROTONIX) 40 MG tablet Take 40 mg by mouth daily. 06/27/18  Yes [provider]  pregabalin (LYRICA) 50 MG capsule Take 50 mg by mouth 3 (three) times daily.   Yes [provider]  RESTASIS 0.05 % ophthalmic emulsion Place 1 drop into both eyes 2 (two) times daily. 06/11/18  Yes [provider]  saccharomyces boulardii (FLORASTOR) 250 MG capsule Take 250 mg by mouth 2 (two) times daily.   Yes [provider]  sildenafil (VIAGRA) 25 MG tablet Take 1-2 tablets by mouth as needed Patient taking differently: Take 50-75 mg by mouth as needed for erectile dysfunction. Take 1-2 tablets by mouth as needed 12/15/14  Yes Michel Bickers, MD  tamsulosin (FLOMAX) 0.4 MG CAPS capsule Take 0.4 mg by mouth every morning.    Yes [provider]  carboxymethylcellulose (REFRESH TEARS) 0.5 % SOLN Place 1 drop into both eyes 3 (three) times daily as needed (dry eyes).    [provider]    Scheduled Meds: . amLODipine  5 mg Oral Daily  . brimonidine  1 drop Both Eyes Daily   And  . timolol  1 drop Both Eyes Daily  . elvitegravir-cobicistat-emtricitabine-tenofovir  1 tablet Oral Daily  . enoxaparin (LOVENOX) injection  40 mg Subcutaneous Q24H  . latanoprost  1 drop Both Eyes QHS   Continuous Infusions: . sodium chloride 75 mL/hr at 07/05/18 0450  . famotidine (PEPCID) IV 20 mg (07/05/18 1009)   PRN Meds:.acetaminophen **OR** acetaminophen, albuterol, fentaNYL (SUBLIMAZE) injection, hydrALAZINE, ondansetron **OR** ondansetron (ZOFRAN) IV, phenol  Allergies as of 07/03/2018 - Review Complete 07/03/2018  Allergen Reaction Noted  . Codeine Other (See Comments)   . Erythromycin Nausea And Vomiting   . Sulfonamide derivatives Nausea And Vomiting   . Sulfamethoxazole Rash 11/30/2015    History reviewed. No pertinent family history.  Social History   Socioeconomic History  . Marital status: Married    Spouse name: Not on file  . Number of  children: Not on file  . Years of education: Not on file  . Highest education level: Not on file  Occupational History  . Not on file  Social Needs  . Financial resource strain: Not on file  . Food insecurity:    Worry: Not on file    Inability: Not on file  . Transportation needs:    Medical: Not on file    Non-medical: Not on file  Tobacco Use  . Smoking status: Never Smoker  . Smokeless tobacco: Never Used  Substance and Sexual Activity  . Alcohol use: Yes    Comment: occassionally  . Drug use: No  . Sexual activity: Yes    Partners: Male    Comment: declined condoms  Lifestyle  . Physical activity:    Days per week: Not on file    Minutes per session: Not on file  . Stress: Not on file  Relationships  . Social connections:    Talks on phone: Not on file    Gets together: Not on file    Attends religious  service: Not on file    Active member of club or organization: Not on file    Attends meetings of clubs or organizations: Not on file    Relationship status: Not on file  . Intimate partner violence:    Fear of current or ex partner: Not on file    Emotionally abused: Not on file    Physically abused: Not on file    Forced sexual activity: Not on file  Other Topics Concern  . Not on file  Social History Narrative  . Not on file    Review of Systems: Review of Systems  Constitutional: Negative for chills and fever.  HENT: Negative for hearing loss and tinnitus.   Eyes: Negative for blurred vision and double vision.  Respiratory: Negative for cough, hemoptysis and sputum production.   Cardiovascular: Negative for chest pain and palpitations.  Gastrointestinal: Positive for abdominal pain, diarrhea, heartburn, nausea and vomiting. Negative for blood in stool and melena.  Genitourinary: Negative for dysuria and urgency.  Musculoskeletal: Negative for back pain and myalgias.  Skin: Negative for itching and rash.  Neurological: Negative for seizures and loss of  consciousness.  Endo/Heme/Allergies: Does not bruise/bleed easily.  Psychiatric/Behavioral: Negative for hallucinations and suicidal ideas.    Physical Exam: Vital signs: Vitals:   07/05/18 1003 07/05/18 1349  BP: (!) 174/88 (!) 149/71  Pulse:  (!) 50  Resp:  17  Temp:  98.3 F (36.8 C)  SpO2:  94%   Last BM Date: 07/05/18 Physical Exam  Constitutional: He is oriented to person, place, and time. He appears well-developed and well-nourished. No distress.  HENT:  Head: Normocephalic and atraumatic.  Mouth/Throat: Oropharynx is clear and moist. No oropharyngeal exudate.  Eyes: EOM are normal. No scleral icterus.  Neck: Normal range of motion. Neck supple.  Cardiovascular: Normal rate, regular rhythm and normal heart sounds.  Pulmonary/Chest: Effort normal and breath sounds normal. No respiratory distress.  Abdominal: Soft. Bowel sounds are normal. He exhibits distension. There is no tenderness. There is no rebound and no guarding.  Small umbilical hernia noted  Musculoskeletal: Normal range of motion. He exhibits no edema.  Neurological: He is alert and oriented to person, place, and time.  Skin: Skin is warm. No erythema.  Psychiatric: He has a normal mood and affect. Judgment normal.  Vitals reviewed.  GI:  Lab Results: Recent Labs    07/03/18 1635 07/04/18 0543  WBC 7.8 5.6  HGB 16.3 13.4  HCT 45.4 38.0*  PLT 156 122*   BMET Recent Labs    07/03/18 1635 07/04/18 0543 07/05/18 0531  NA 143 144 144  K 3.9 3.8 3.6  CL 104 110 111  CO2 27 28 28   GLUCOSE 140* 139* 100*  BUN 17 17 15   CREATININE 1.29* 1.11 1.18  CALCIUM 10.3 9.3 8.9   LFT Recent Labs    07/04/18 0543  PROT 6.7  ALBUMIN 4.0  AST 25  ALT 34  ALKPHOS 58  BILITOT 1.4*   PT/INR No results for input(s): LABPROT, INR in the last 72 hours.   Studies/Results: Ct Abdomen Pelvis W Contrast  Result Date: 07/03/2018 CLINICAL DATA:  Acute generalized abdominal pain. EXAM: CT ABDOMEN AND  PELVIS WITH CONTRAST TECHNIQUE: Multidetector CT imaging of the abdomen and pelvis was performed using the standard protocol following bolus administration of intravenous contrast. CONTRAST:  165mL ISOVUE-300 IOPAMIDOL (ISOVUE-300) INJECTION 61% COMPARISON:  CT scan of April 24, 2014. FINDINGS: Lower chest: No acute abnormality. Hepatobiliary: No focal liver  abnormality is seen. Status post cholecystectomy. No biliary dilatation. Pancreas: Unremarkable. No pancreatic ductal dilatation or surrounding inflammatory changes. Spleen: Normal in size without focal abnormality. Adrenals/Urinary Tract: Adrenal glands appear normal. Bilateral simple renal cysts are noted. No hydronephrosis or renal obstruction is noted. No renal or ureteral calculi are noted. Urinary bladder is unremarkable. 2.1 cm rounded enhancing abnormality is seen in midpole of left kidney which which is concerning for neoplasm. Stomach/Bowel: The stomach appears normal. The appendix appears normal. No colonic dilatation is noted. Mildly dilated small bowel loops are noted in the central portion of the abdomen which may represent focal ileus. Vascular/Lymphatic: No significant vascular findings are present. No enlarged abdominal or pelvic lymph nodes. Reproductive: Prostate is unremarkable. Other: No abdominal wall hernia or abnormality. No abdominopelvic ascites. Musculoskeletal: No acute or significant osseous findings. IMPRESSION: Mildly dilated small bowel loops are noted in central portion of abdomen concerning for possible ileus or less likely small bowel obstruction. Continued radiographic follow-up is recommended. 2.1 cm rounded enhancing abnormality seen in the midpole cortex of the left kidney which is concerning for possible neoplasm or malignancy. MRI is recommended for further evaluation. Electronically Signed   By: Marijo Conception, M.D.   On: 07/03/2018 19:02   Dg Abd 2 Views  Result Date: 07/03/2018 CLINICAL DATA:  Abdominal pain with  nausea and vomiting EXAM: ABDOMEN - 2 VIEW COMPARISON:  None. FINDINGS: Scattered large and small bowel gas is noted. Multiple dilated loops of small bowel with differential air-fluid levels are seen consistent with at least a partial small bowel obstruction. No free air is noted. No acute bony abnormality is seen. No mass lesion is noted. IMPRESSION: Changes consistent with a least partial small bowel obstruction. CT is recommended for further evaluation. These results will be called to the ordering clinician or representative by the Radiologist Assistant, and communication documented in the PACS or zVision Dashboard. Electronically Signed   By: Inez Catalina M.D.   On: 07/03/2018 15:16   Dg Abd Portable 1v-small Bowel Obstruction Protocol-initial, 8 Hr Delay  Result Date: 07/04/2018 CLINICAL DATA:  8 hour delay film on small-bowel obstruction EXAM: PORTABLE ABDOMEN - 1 VIEW COMPARISON:  07/03/2018 FINDINGS: Scattered large and small bowel gas is seen. Mild small-bowel dilatation is again identified and stable from the prior CT examination. Administered contrast now lies throughout the colon. No bony abnormality is noted. Nasogastric catheter remains in the stomach. IMPRESSION: Administered contrast now lies throughout the colon. Mild persistent dilatation of small bowel loops most consistent with a partial small bowel obstruction. Electronically Signed   By: Inez Catalina M.D.   On: 07/04/2018 08:32   Dg Abd Portable 1v-small Bowel Protocol-position Verification  Result Date: 07/03/2018 CLINICAL DATA:  Nasogastric tube placement EXAM: PORTABLE ABDOMEN - 1 VIEW COMPARISON:  None. FINDINGS: The tip and side port of the nasogastric tube project over the stomach. IMPRESSION: NG tube tip and side-port in the stomach. Electronically Signed   By: Ulyses Jarred M.D.   On: 07/03/2018 21:21   Ct Renal Abd W/wo  Result Date: 07/04/2018 CLINICAL DATA:  Inpatient.  Suspicious left renal mass on recent CT. EXAM: CT  ABDOMEN WITHOUT AND WITH CONTRAST TECHNIQUE: Multidetector CT imaging of the abdomen was performed following the standard protocol before and following the bolus administration of intravenous contrast. CONTRAST:  130mL OMNIPAQUE IOHEXOL 300 MG/ML  SOLN COMPARISON:  07/03/2018 CT abdomen/pelvis. FINDINGS: Lower chest: No significant pulmonary nodules or acute consolidative airspace disease. Coarse calcified right parasagittal  and right hilar nodes from prior granulomatous disease. Hepatobiliary: Diffuse hepatic steatosis. No definite liver surface irregularity. Subcentimeter hypodense segment 4A left liver lobe lesion is too small to characterize. No additional liver mass. Cholecystectomy. Bile ducts are within normal post cholecystectomy limits. Pancreas: Normal, with no mass or duct dilation. Spleen: Mild splenomegaly (craniocaudal splenic length 15.0 cm). No splenic mass. Adrenals/Urinary Tract: Normal adrenals. No renal stones. No hydronephrosis. Avidly enhancing 2.0 x 1.8 cm renal cortical mass in the interpolar left kidney (series 11/image 71). 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 Small simple bilateral renal cysts, largest 1.9 cm in the upper right kidney. Additional subcentimeter hypodense renal cortical lesions in both kidneys, too small to characterize. Stomach/Bowel: Enteric tube terminates in the distal body of the stomach. Stomach is collapsed and otherwise normal. Mildly dilated small bowel loops with air-fluid levels throughout the visualized abdomen measuring up to the 3.2 cm diameter, decreased from 3.7 cm. No wall thickening in the visualized bowel. Moderate left colonic diverticulosis. Oral contrast transits to the left colon. Vascular/Lymphatic: Atherosclerotic nonaneurysmal abdominal aorta. Patent portal, splenic, hepatic and renal veins. No pathologically enlarged  lymph nodes in the abdomen. Other: No pneumoperitoneum. Trace perihepatic ascites. No focal fluid collection. Musculoskeletal: No aggressive appearing focal osseous lesions. Mild thoracolumbar spondylosis. IMPRESSION: 1. 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 Patent renal veins with no tumor thrombus. No evidence of metastatic disease in the abdomen. 2. Mildly dilated small bowel loops with air-fluid levels throughout the visualized abdomen, mildly improved compared to the CT study from 1 day prior. Oral contrast transits to the left colon. Findings suggest improving mild small-bowel obstruction or ileus. 3. Mild splenomegaly.  Trace perihepatic ascites. 4. Diffuse hepatic steatosis. No overt morphologic findings of hepatic cirrhosis. 5.  Aortic Atherosclerosis (ICD10-I70.0). Electronically Signed   By: Ilona Sorrel M.D.   On: 07/04/2018 19:09    Impression/Plan: -Partial small bowel obstruction/ileus.  Improving. -Diarrhea.  C. difficile negative.  Could be bile salt diarrhea.  He is status post cholecystectomy. -History of transverse colon resection for tubular villous adenoma in 2007.  Last colonoscopy November 2015 was normal.  - HIV    Recommendations -------------------------- -Follow GI pathogen panel. -Advance diet slowly as tolerated. -Trial of Colestid once GI pathogen panel is negative. - GI will follow.     LOS: 1 day   Otis Brace  MD, FACP 07/05/2018, 3:07 PM  Contact #  [phone]

## 2018-07-06 LAB — GASTROINTESTINAL PANEL BY PCR, STOOL (REPLACES STOOL CULTURE)

## 2018-07-06 LAB — BASIC METABOLIC PANEL
ANION GAP: 5 (ref 5–15)
BUN: 13 mg/dL (ref 8–23)
CO2: 26 mmol/L (ref 22–32)
Calcium: 8.4 mg/dL — ABNORMAL LOW (ref 8.9–10.3)
Chloride: 111 mmol/L (ref 98–111)
Creatinine, Ser: 1.09 mg/dL (ref 0.61–1.24)
GFR calc Af Amer: >60 mL/min (ref >60)
GLUCOSE: 117 mg/dL — AB (ref 70–99)
POTASSIUM: 3.5 mmol/L (ref 3.5–5.1)
Sodium: 142 mmol/L (ref 135–145)

## 2018-07-06 LAB — CBC
HCT: 33.5 % — ABNORMAL LOW (ref 39.0–52.0)
Hemoglobin: 11.7 g/dL — ABNORMAL LOW (ref 13.0–17.0)
MCH: 30.7 pg (ref 26.0–34.0)
MCHC: 34.9 g/dL (ref 30.0–36.0)
MCV: 87.9 fL (ref 78.0–100.0)
PLATELETS: 108 10*3/uL — AB (ref 150–400)
RBC: 3.81 MIL/uL — ABNORMAL LOW (ref 4.22–5.81)
RDW: 14.1 % (ref 11.5–15.5)
WBC: 4 10*3/uL (ref 4.0–10.5)

## 2018-07-06 MED ORDER — COLESTIPOL HCL 1 G PO TABS: 1.0000 g | ORAL_TABLET | Freq: Every day | ORAL | 0 refills | Status: DC

## 2018-07-06 MED ORDER — COLESTIPOL HCL 1 G PO TABS
1.0000 g | ORAL_TABLET | Freq: Every day | ORAL | Status: DC
  Administered 2018-07-06: 1 g via ORAL
  Filled 2018-07-06: qty 1

## 2018-07-06 MED ORDER — AMLODIPINE BESYLATE 10 MG PO TABS: 10.0000 mg | ORAL_TABLET | Freq: Every day | ORAL | 0 refills | Status: DC

## 2018-07-06 MED ORDER — AMLODIPINE BESYLATE 10 MG PO TABS
10.0000 mg | ORAL_TABLET | Freq: Every day | ORAL | Status: DC
  Administered 2018-07-06: 10 mg via ORAL
  Filled 2018-07-06: qty 1

## 2018-07-06 MED ORDER — PREGABALIN 25 MG PO CAPS: 50.0000 mg | ORAL_CAPSULE | Freq: Every day | ORAL | 0 refills | Status: DC

## 2018-07-06 NOTE — Care Management Note (Signed)
Case Management Note  Patient Details  Name: Brett Sims MRN: [id number] Date of Birth: 1946-07-14  Subjective/Objective:                  discharged  Action/Plan: No cm needs present at time of discharge  Expected Discharge Date:  07/06/18               Expected Discharge Plan:  Home/Self Care  In-House Referral:     Discharge planning Services  CM Consult  Post Acute Care Choice:    Choice offered to:     DME Arranged:    DME Agency:     HH Arranged:    [place] Agency:     Status of Service:  Completed, signed off  If discussed at [employer] of Stay Meetings, dates discussed:    Additional Comments:  Leeroy Cha, RN 07/06/2018, 12:42 PM

## 2018-07-06 NOTE — Progress Notes (Addendum)
Pt reports being groggy this morning and attributes that to taking his own supply of Lyrica last night Pt was instructed not to take any other meds he may have in his possession

## 2018-07-06 NOTE — Discharge Summary (Addendum)
Discharge Summary  Brett Sims NKN:[id number] DOB: 08-04-1946  PCP: [name], MD  Admit date: 07/03/2018 Discharge date: 07/06/2018  Time spent: 25 minutes  Recommendations for Outpatient Follow-up:  1. Follow-up with your PCP 2. Follow-up with ID 3. Follow-up with oncology Dr. Marin Olp 4. Take your medications as prescribed  Discharge Diagnoses:  Active Hospital Problems   Diagnosis Date Noted  . Ileus ([place]) 07/03/2018  . Renal mass 07/04/2018  . CKD (chronic kidney disease), stage III ([place]) 07/04/2018  . Elevated liver enzymes 07/04/2018  . HIV (human immunodeficiency virus infection) ([place]) 06/12/2007  . GERD 06/12/2007    Resolved Hospital Problems  No resolved problems to display.    Discharge Condition: Stable  Diet recommendation: Resume previous diet  Vitals:   07/05/18 2003 07/06/18 0535  BP: (!) 154/75 (!) 142/65  Pulse: 61 (!) 54  Resp: 15 15  Temp: 98.1 F (36.7 C) 98 F (36.7 C)  SpO2: 97% 97%    History of present illness:   Brett Sims a 72 y.o.malevery pleasant with medical history significant ofHIVwith neuropathy and GERD; who presents with complaints of constant generalized abdominal pain and distention over the last 2 days. Patient reports having associated symptoms of 10-12 episodes of diarrhea. No melena or hematochezia.  He also reported having several episodes of vomiting. -07/04/2018: Persistent diarrhea with watery stools.  C. difficile PCR pending.  NG tube to suction with moderate amount of fluid collected. -07/05/18: C. difficile negative however continues to have watery loose stools.  Diarrhea is chronic comes and goes.  On Imodium chronically.  GI consulted and will see the patient.  GI panel pending.  07/06/2018: Patient seen and examined at his bedside.  GI panel and C. difficile PCR negative.  Seen by GI this morning.  Recommendation to take 1 dose of Colestid daily.  On the day of discharge, the patient was  hemodynamically stable.  He was tolerating a solid diet and ambulating without difficulty.  He will need to follow-up with his PCP and infectious disease posthospitalization.  Patient understands and agrees to plan.  Hospital Course:  Principal Problem:   Ileus ([place]) Active Problems:   HIV (human immunodeficiency virus infection) ([place])   GERD   Renal mass   CKD (chronic kidney disease), stage III ([facility])   Elevated liver enzymes  Resolved severe abdominal pain suspect secondary to ileus versus partial small bowel obstruction Diarrhea is improving GI panel and C. 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 Follow-up with oncology outpatient Dr. Marin Olp  Improving chronic diarrhea C. difficile and GI panel negative GI consulted, followed Start Colestid daily  HIV Last viral load undetectable CD4 count greater than 300 Continue HIV medications Follow-up with ID  CKD 2, stable Avoid nephrotoxic agents/hypotension/dehydration Monitor urine output Creatinine is back to baseline 1.09 Follow-up with your PCP outpatient   Code Status: Full code   Consultants:  GI  Procedures:  None    Discharge Exam: BP (!) 142/65 (BP Location: Right Arm)   Pulse (!) 54   Temp 98 F (36.7 C) (Oral)   Resp 15   Ht 5\' 10"  (1.778 m)   Wt 84.4 kg   SpO2 97%   BMI 26.69 kg/m  . General: 72 y.o. year-old male well developed well  nourished in no acute distress.  Alert and oriented x3. . Cardiovascular: Regular rate and rhythm with no rubs or gallops.  No thyromegaly or JVD noted.   Marland Kitchen Respiratory: Clear to auscultation with no wheezes or rales. Good inspiratory effort. . Abdomen: Soft nontender nondistended with normal bowel sounds x4  quadrants. . Musculoskeletal: No lower extremity edema. 2/4 pulses in all 4 extremities. . Skin: No ulcerative lesions noted or rashes, . Psychiatry: Mood is appropriate for condition and setting  Discharge Instructions You were cared for by a hospitalist during your hospital stay. If you have any questions about your discharge medications or the care you received while you were in the hospital after you are discharged, you can call the unit and asked to speak with the hospitalist on call if the hospitalist that took care of you is not available. Once you are discharged, your primary care physician will handle any further medical issues. Please note that NO REFILLS for any discharge medications will be authorized once you are discharged, as it is imperative that you return to your primary care physician (or establish a relationship with a primary care physician if you do not have one) for your aftercare needs so that they can reassess your need for medications and monitor your lab values.   Allergies as of 07/06/2018      Reactions   Codeine Other (See Comments)   Mood changes    Erythromycin Nausea And Vomiting   Sulfonamide Derivatives Nausea And Vomiting   Sulfamethoxazole Rash      Medication List    STOP taking these medications   ibuprofen 800 MG tablet Commonly known as:  ADVIL,MOTRIN   tamsulosin 0.4 MG Caps capsule Commonly known as:  FLOMAX     TAKE these medications   allopurinol 100 MG tablet Commonly known as:  ZYLOPRIM Take 200 mg by mouth daily.   amLODipine 10 MG tablet Commonly known as:  NORVASC Take 1 tablet (10 mg total) by mouth daily. Start taking on:  07/07/2018   Clindamycin Phosphate foam Apply 1 application topically 2 (two) times daily as needed (dermatitis).   colestipol 1 g tablet Commonly known as:  COLESTID Take 1 tablet (1 g total) by mouth daily. Start taking on:  07/07/2018   COMBIGAN 0.2-0.5 % ophthalmic solution Generic drug:   brimonidine-timolol Place 1 drop into both eyes daily.   GENVOYA 150-150-200-10 MG Tabs tablet Generic drug:  elvitegravir-cobicistat-emtricitabine-tenofovir TAKE 1 TABLET BY MOUTH ONCE DAILY WITH BREAKFAST What changed:  See the new instructions.   ketoconazole 2 % cream Commonly known as:  NIZORAL Apply 1 application topically daily as needed for irritation.   latanoprost 0.005 % ophthalmic solution Commonly known as:  XALATAN 1 drop at bedtime.   loperamide 2 MG tablet Commonly known as:  IMODIUM A-D Take 2 mg by mouth every morning.   multivitamin tablet Take 1 tablet by mouth every morning.   mupirocin ointment 2 % Commonly known as:  BACTROBAN Place 1 application into the nose daily as needed ("bump").   pantoprazole 40 MG tablet Commonly known as:  PROTONIX Take 40 mg by mouth daily.   pregabalin 25 MG capsule Commonly known as:  LYRICA Take 2 capsules (50 mg total) by mouth at bedtime. What changed:    medication strength  when to take this   REFRESH TEARS 0.5 % Soln Generic drug:  carboxymethylcellulose Place 1 drop into both eyes 3 (three) times daily as needed (dry eyes).   RESTASIS 0.05 %  ophthalmic emulsion Generic drug:  cycloSPORINE Place 1 drop into both eyes 2 (two) times daily.   saccharomyces boulardii 250 MG capsule Commonly known as:  FLORASTOR Take 250 mg by mouth 2 (two) times daily.   sildenafil 25 MG tablet Commonly known as:  VIAGRA Take 1-2 tablets by mouth as needed What changed:    how much to take  how to take this  when to take this  reasons to take this      Allergies  Allergen Reactions  . Codeine Other (See Comments)    Mood changes   . Erythromycin Nausea And Vomiting  . Sulfonamide Derivatives Nausea And Vomiting  . Sulfamethoxazole Rash   Follow-up Information    [name], MD. Call in 1 day(s).   Specialty:  Internal Medicine Why:  Please call for a post hospital follow-up appointment. Contact  information: [address], [address] [place] [address] [phone]        Michel Bickers, MD .   Specialty:  Infectious Diseases Contact information: [address] [place] [address] [phone]        Volanda Napoleon, MD. Call in 1 day(s).   Specialty:  Oncology Why:  Please call for a follow-up appointment.  The office of Dr. Marin Olp will also call you for an appointment.  Dr. Marin Olp stated he will put you on his schedule today 07/05/2018. Contact information: [address] [place] [address] [phone]            The results of significant diagnostics from this hospitalization (including imaging, microbiology, ancillary and laboratory) are listed below for reference.    Significant Diagnostic Studies: Ct Abdomen Pelvis W Contrast  Result Date: 07/03/2018 CLINICAL DATA:  Acute generalized abdominal pain. EXAM: CT ABDOMEN AND PELVIS WITH CONTRAST TECHNIQUE: Multidetector CT imaging of the abdomen and pelvis was performed using the standard protocol following bolus administration of intravenous contrast. CONTRAST:  184mL ISOVUE-300 IOPAMIDOL (ISOVUE-300) INJECTION 61% COMPARISON:  CT scan of April 24, 2014. FINDINGS: Lower chest: No acute abnormality. Hepatobiliary: No focal liver abnormality is seen. Status post cholecystectomy. No biliary dilatation. Pancreas: Unremarkable. No pancreatic ductal dilatation or surrounding inflammatory changes. Spleen: Normal in size without focal abnormality. Adrenals/Urinary Tract: Adrenal glands appear normal. Bilateral simple renal cysts are noted. No hydronephrosis or renal obstruction is noted. No renal or ureteral calculi are noted. Urinary bladder is unremarkable. 2.1 cm rounded enhancing abnormality is seen in midpole of left kidney which which is concerning for neoplasm. Stomach/Bowel: The stomach appears normal. The appendix appears normal. No colonic dilatation is noted. Mildly  dilated small bowel loops are noted in the central portion of the abdomen which may represent focal ileus. Vascular/Lymphatic: No significant vascular findings are present. No enlarged abdominal or pelvic lymph nodes. Reproductive: Prostate is unremarkable. Other: No abdominal wall hernia or abnormality. No abdominopelvic ascites. Musculoskeletal: No acute or significant osseous findings. IMPRESSION: Mildly dilated small bowel loops are noted in central portion of abdomen concerning for possible ileus or less likely small bowel obstruction. Continued radiographic follow-up is recommended. 2.1 cm rounded enhancing abnormality seen in the midpole cortex of the left kidney which is concerning for possible neoplasm or malignancy. MRI is recommended for further evaluation. Electronically Signed   By: Marijo Conception, M.D.   On: 07/03/2018 19:02   Dg Abd 2 Views  Result Date: 07/03/2018 CLINICAL DATA:  Abdominal pain with nausea and vomiting EXAM: ABDOMEN - 2 VIEW  COMPARISON:  None. FINDINGS: Scattered large and small bowel gas is noted. Multiple dilated loops of small bowel with differential air-fluid levels are seen consistent with at least a partial small bowel obstruction. No free air is noted. No acute bony abnormality is seen. No mass lesion is noted. IMPRESSION: Changes consistent with a least partial small bowel obstruction. CT is recommended for further evaluation. These results will be called to the ordering clinician or representative by the Radiologist Assistant, and communication documented in the PACS or zVision Dashboard. Electronically Signed   By: Inez Catalina M.D.   On: 07/03/2018 15:16   Dg Abd Portable 1v-small Bowel Obstruction Protocol-initial, 8 Hr Delay  Result Date: 07/04/2018 CLINICAL DATA:  8 hour delay film on small-bowel obstruction EXAM: PORTABLE ABDOMEN - 1 VIEW COMPARISON:  07/03/2018 FINDINGS: Scattered large and small bowel gas is seen. Mild small-bowel dilatation is again  identified and stable from the prior CT examination. Administered contrast now lies throughout the colon. No bony abnormality is noted. Nasogastric catheter remains in the stomach. IMPRESSION: Administered contrast now lies throughout the colon. Mild persistent dilatation of small bowel loops most consistent with a partial small bowel obstruction. Electronically Signed   By: Inez Catalina M.D.   On: 07/04/2018 08:32   Dg Abd Portable 1v-small Bowel Protocol-position Verification  Result Date: 07/03/2018 CLINICAL DATA:  Nasogastric tube placement EXAM: PORTABLE ABDOMEN - 1 VIEW COMPARISON:  None. FINDINGS: The tip and side port of the nasogastric tube project over the stomach. IMPRESSION: NG tube tip and side-port in the stomach. Electronically Signed   By: Ulyses Jarred M.D.   On: 07/03/2018 21:21   Ct Renal Abd W/wo  Result Date: 07/04/2018 CLINICAL DATA:  Inpatient.  Suspicious left renal mass on recent CT. EXAM: CT ABDOMEN WITHOUT AND WITH CONTRAST TECHNIQUE: Multidetector CT imaging of the abdomen was performed following the standard protocol before and following the bolus administration of intravenous contrast. CONTRAST:  151mL OMNIPAQUE IOHEXOL 300 MG/ML  SOLN COMPARISON:  07/03/2018 CT abdomen/pelvis. FINDINGS: Lower chest: No significant pulmonary nodules or acute consolidative airspace disease. Coarse calcified right parasagittal and right hilar nodes from prior granulomatous disease. Hepatobiliary: Diffuse hepatic steatosis. No definite liver surface irregularity. Subcentimeter hypodense segment 4A left liver lobe lesion is too small to characterize. No additional liver mass. Cholecystectomy. Bile ducts are within normal post cholecystectomy limits. Pancreas: Normal, with no mass or duct dilation. Spleen: Mild splenomegaly (craniocaudal splenic length 15.0 cm). No splenic mass. Adrenals/Urinary Tract: Normal adrenals. No renal stones. No hydronephrosis. Avidly enhancing 2.0 x 1.8 cm renal cortical  mass in the interpolar left kidney (series 11/image 71). 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 Small simple bilateral renal cysts, largest 1.9 cm in the upper right kidney. Additional subcentimeter hypodense renal cortical lesions in both kidneys, too small to characterize. Stomach/Bowel: Enteric tube terminates in the distal body of the stomach. Stomach is collapsed and otherwise normal. Mildly dilated small bowel loops with air-fluid levels throughout the visualized abdomen measuring up to the 3.2 cm diameter, decreased from 3.7 cm. No wall thickening in the visualized bowel. Moderate left colonic diverticulosis. Oral contrast transits to the left colon. Vascular/Lymphatic: Atherosclerotic nonaneurysmal abdominal aorta. Patent portal, splenic, hepatic and renal veins. No pathologically enlarged lymph nodes in the abdomen. Other: No pneumoperitoneum. Trace perihepatic ascites. No focal fluid collection. Musculoskeletal: No aggressive appearing focal osseous lesions. Mild thoracolumbar spondylosis.  IMPRESSION: 1. 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 Patent renal veins with no tumor thrombus. No evidence of metastatic disease in the abdomen. 2. Mildly dilated small bowel loops with air-fluid levels throughout the visualized abdomen, mildly improved compared to the CT study from 1 day prior. Oral contrast transits to the left colon. Findings suggest improving mild small-bowel obstruction or ileus. 3. Mild splenomegaly.  Trace perihepatic ascites. 4. Diffuse hepatic steatosis. No overt morphologic findings of hepatic cirrhosis. 5.  Aortic Atherosclerosis (ICD10-I70.0). Electronically Signed   By: Ilona Sorrel M.D.   On: 07/04/2018 19:09    Microbiology: Recent Results (from the past 240 hour(s))  C  difficile quick scan w PCR reflex     Status: None   Collection Time: 07/05/18  6:49 AM  Result Value Ref Range Status   C Diff antigen NEGATIVE NEGATIVE Final   C Diff toxin NEGATIVE NEGATIVE Final   C Diff interpretation No C. difficile detected.  Final    Comment: Performed at [facility], [place] [address] [place], [place] [address]  Gastrointestinal Panel by PCR , Stool     Status: None   Collection Time: 07/05/18 12:10 PM  Result Value Ref Range Status   Campylobacter species NOT DETECTED NOT DETECTED Final   Plesimonas shigelloides NOT DETECTED NOT DETECTED Final   Salmonella species NOT DETECTED NOT DETECTED Final   Yersinia enterocolitica NOT DETECTED NOT DETECTED Final   Vibrio species NOT DETECTED NOT DETECTED Final   Vibrio cholerae NOT DETECTED NOT DETECTED Final   Enteroaggregative E coli (EAEC) NOT DETECTED NOT DETECTED Final   Enteropathogenic E coli (EPEC) NOT DETECTED NOT DETECTED Final   Enterotoxigenic E coli (ETEC) NOT DETECTED NOT DETECTED Final   Shiga like toxin producing E coli (STEC) NOT DETECTED NOT DETECTED Final   Shigella/Enteroinvasive E coli (EIEC) NOT DETECTED NOT DETECTED Final   Cryptosporidium NOT DETECTED NOT DETECTED Final   Cyclospora cayetanensis NOT DETECTED NOT DETECTED Final   Entamoeba histolytica NOT DETECTED NOT DETECTED Final   Giardia lamblia NOT DETECTED NOT DETECTED Final   Adenovirus F40/41 NOT DETECTED NOT DETECTED Final   Astrovirus NOT DETECTED NOT DETECTED Final   Norovirus GI/GII NOT DETECTED NOT DETECTED Final   Rotavirus A NOT DETECTED NOT DETECTED Final   Sapovirus (I, II, IV, and V) NOT DETECTED NOT DETECTED Final    Comment: Performed at [facility], [address] [place], [place] [address]     Labs: Basic Metabolic Panel: Recent Labs  Lab 07/03/18 1635 07/04/18 0543 07/05/18 0531 07/06/18 0548  NA 143 144 144 142  K 3.9 3.8 3.6 3.5  CL 104 110 111 111  CO2 27 28 28 26   GLUCOSE  140* 139* 100* 117*  BUN 17 17 15 13   CREATININE 1.29* 1.11 1.18 1.09  CALCIUM 10.3 9.3 8.9 8.4*   Liver Function Tests: Recent Labs  Lab 07/03/18 1635 07/04/18 0543  AST 36 25  ALT 46* 34  ALKPHOS 77 58  BILITOT 2.0* 1.4*  PROT 8.6* 6.7  ALBUMIN 5.3* 4.0   Recent Labs  Lab 07/03/18 1635  LIPASE 33   No results for input(s): AMMONIA in the last 168 hours. CBC: Recent Labs  Lab 07/03/18 1635 07/04/18 0543 07/06/18 0548  WBC 7.8 5.6 4.0  HGB 16.3 13.4 11.7*  HCT 45.4 38.0* 33.5*  MCV 87.3 88.2 87.9  PLT 156 122* 108*  Cardiac Enzymes: No results for input(s): CKTOTAL, CKMB, CKMBINDEX, TROPONINI in the last 168 hours. BNP: BNP (last 3 results) No results for input(s): BNP in the last 8760 hours.  ProBNP (last 3 results) No results for input(s): PROBNP in the last 8760 hours.  CBG: No results for input(s): GLUCAP in the last 168 hours.     Signed:  Kayleen Memos, MD [facility] 07/06/2018, 12:24 PM

## 2018-07-06 NOTE — Progress Notes (Signed)
[facility] Progress Note  Subjective: The patient feels fine today. No abdominal pain. Once to go home. He says he still has diarrhea, but he's had diarrhea for years and currently its no different than it has been. His C. Difficile was negative. His GI pathogens panel is pending. He is eating.  Objective: Vital signs in last 24 hours: Temp:  [98 F (36.7 C)-98.3 F (36.8 C)] 98 F (36.7 C) (09/27 0535) Pulse Rate:  [50-61] 54 (09/27 0535) Resp:  [15-17] 15 (09/27 0535) BP: (142-154)/(65-75) 142/65 (09/27 0535) SpO2:  [94 %-97 %] 97 % (09/27 0535) Weight change:    PE:  No distress  Abdomen soft  Lab Results: Results for orders placed or performed during the hospital encounter of 07/03/18 (from the past 24 hour(s))  C-reactive protein     Status: Abnormal   Collection Time: 07/05/18 12:45 PM  Result Value Ref Range   CRP 2.1 (H) <1.0 mg/dL  CBC     Status: Abnormal   Collection Time: 07/06/18  5:48 AM  Result Value Ref Range   WBC 4.0 4.0 - 10.5 K/uL   RBC 3.81 (L) 4.22 - 5.81 MIL/uL   Hemoglobin 11.7 (L) 13.0 - 17.0 g/dL   HCT 33.5 (L) 39.0 - 52.0 %   MCV 87.9 78.0 - 100.0 fL   MCH 30.7 26.0 - 34.0 pg   MCHC 34.9 30.0 - 36.0 g/dL   RDW 14.1 11.5 - 15.5 %   Platelets 108 (L) 150 - 400 K/uL  Basic metabolic panel     Status: Abnormal   Collection Time: 07/06/18  5:48 AM  Result Value Ref Range   Sodium 142 135 - 145 mmol/L   Potassium 3.5 3.5 - 5.1 mmol/L   Chloride 111 98 - 111 mmol/L   CO2 26 22 - 32 mmol/L   Glucose, Bld 117 (H) 70 - 99 mg/dL   BUN 13 8 - 23 mg/dL   Creatinine, Ser 1.09 0.61 - 1.24 mg/dL   Calcium 8.4 (L) 8.9 - 10.3 mg/dL   GFR calc non Af Amer >60 >60 mL/min   GFR calc Af Amer >60 >60 mL/min   Anion gap 5 5 - 15    Studies/Results: No results found.    Assessment: Ileus. Clinically resolved  Plan:   I think he can go home. I don't think he needs to stay around in the hospital waiting on the GI pathogens panel to  return. This can be followed up on. He needs to follow-up with his PCP and infectious disease physician. GI can see him again at their discretion. Call us if needed.    SAM F Margurete Guaman 07/06/2018, 10:05 AM  Pager: [phone] If no answer or after 5 PM call [phone]

## 2018-07-06 NOTE — Discharge Instructions (Signed)
Small Bowel Obstruction °A small bowel obstruction means that something is blocking the small bowel. The small bowel is also called the small intestine. It is the long tube that connects the stomach to the colon. An obstruction will stop food and fluids from passing through the small bowel. Treatment depends on what is causing the problem and how bad the problem is. °Follow these instructions at home: °· Get a lot of rest. °· Follow your diet as told by your doctor. You may need to: °? Only drink clear liquids until you start to get better. °? Avoid solid foods as told by your doctor. °· Take over-the-counter and prescription medicines only as told by your doctor. °· Keep all follow-up visits as told by your doctor. This is important. °Contact a doctor if: °· You have a fever. °· You have chills. °Get help right away if: °· You have pain or cramps that get worse. °· You throw up (vomit) blood. °· You have a feeling of being sick to your stomach (nausea) that does not go away. °· You cannot stop throwing up. °· You cannot drink fluids. °· You feel confused. °· You feel dry or thirsty (dehydrated). °· Your belly gets more bloated. °· You feel weak or you pass out (faint). °This information is not intended to replace advice given to you by your health care provider. Make sure you discuss any questions you have with your health care provider. °Document Released: 11/03/2004 Document Revised: 05/23/2016 Document Reviewed: 11/20/2014 °Elsevier Interactive Patient Education © 2018 [employer] ° °

## 2018-07-09 DIAGNOSIS — H43393 Other vitreous opacities, bilateral: Secondary | ICD-10-CM | POA: Diagnosis not present

## 2018-07-09 DIAGNOSIS — Z961 Presence of intraocular lens: Secondary | ICD-10-CM | POA: Diagnosis not present

## 2018-07-09 DIAGNOSIS — H16223 Keratoconjunctivitis sicca, not specified as Sjogren's, bilateral: Secondary | ICD-10-CM | POA: Diagnosis not present

## 2018-07-09 DIAGNOSIS — H26492 Other secondary cataract, left eye: Secondary | ICD-10-CM | POA: Diagnosis not present

## 2018-07-09 DIAGNOSIS — Z23 Encounter for immunization: Secondary | ICD-10-CM | POA: Diagnosis not present

## 2018-07-09 DIAGNOSIS — H401111 Primary open-angle glaucoma, right eye, mild stage: Secondary | ICD-10-CM | POA: Diagnosis not present

## 2018-07-12 ENCOUNTER — Encounter: Payer: Self-pay | Admitting: Internal Medicine

## 2018-07-12 ENCOUNTER — Ambulatory Visit (INDEPENDENT_AMBULATORY_CARE_PROVIDER_SITE_OTHER): Payer: Medicare PPO | Admitting: Internal Medicine

## 2018-07-12 DIAGNOSIS — Z23 Encounter for immunization: Secondary | ICD-10-CM

## 2018-07-12 DIAGNOSIS — Z21 Asymptomatic human immunodeficiency virus [HIV] infection status: Secondary | ICD-10-CM

## 2018-07-12 DIAGNOSIS — B20 Human immunodeficiency virus [HIV] disease: Secondary | ICD-10-CM | POA: Diagnosis not present

## 2018-07-12 NOTE — Progress Notes (Signed)
Patient Active Problem List   Diagnosis Date Noted  . HIV (human immunodeficiency virus infection) ([place]) 06/12/2007    Priority: High  . Renal insufficiency 08/03/2016    Priority: Medium  . HYPERGLYCEMIA 04/09/2009    Priority: Medium  . HYPERLIPIDEMIA 12/31/2007    Priority: Medium  . Renal mass 07/04/2018  . CKD (chronic kidney disease), stage III (Brett Sims) 07/04/2018  . Elevated liver enzymes 07/04/2018  . Ileus ([place]) 07/03/2018  . Abnormal CT scan, kidney 07/03/2018  . Glaucoma 07/06/2017  . Peripheral neuropathy 01/05/2016  . Gout 07/07/2015  . Erectile dysfunction 05/20/2014  . Cholecystitis with cholelithiasis 04/24/2014  . GERD 06/12/2007  . BENIGN PROSTATIC HYPERTROPHY 06/12/2007  . PNEUMONIA, HX OF 06/12/2007  . 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   AMLODIPINE (NORVASC) 10 MG TABLET    Take 1 tablet (10 mg total) by mouth daily.   CARBOXYMETHYLCELLULOSE (REFRESH TEARS) 0.5 % SOLN    Place 1 drop into both eyes 3 (three) times daily as needed (dry eyes).   CLINDAMYCIN PHOSPHATE FOAM    Apply 1 application topically 2 (two) times daily as needed (dermatitis).    COLESTIPOL (COLESTID) 1 G TABLET    Take 1 tablet (1 g total) by mouth daily.   COMBIGAN 0.2-0.5 % OPHTHALMIC SOLUTION    Place 1 drop into both eyes daily.   GENVOYA 150-150-200-10 MG TABS TABLET    TAKE 1 TABLET BY MOUTH ONCE DAILY WITH BREAKFAST   KETOCONAZOLE (NIZORAL) 2 % CREAM    Apply 1 application topically daily as needed for irritation.   LATANOPROST (XALATAN) 0.005 % OPHTHALMIC SOLUTION    1 drop at bedtime.   LOPERAMIDE (IMODIUM A-D) 2 MG TABLET    Take 2 mg by mouth every morning.    MULTIPLE VITAMIN (MULTIVITAMIN) TABLET    Take 1 tablet by mouth every morning.    MUPIROCIN OINTMENT (BACTROBAN) 2 %    Place 1  application into the nose daily as needed ("bump").   PANTOPRAZOLE (PROTONIX) 40 MG TABLET    Take 40 mg by mouth daily.   PREGABALIN (LYRICA) 25 MG CAPSULE    Take 2 capsules (50 mg total) by mouth at bedtime.   RESTASIS 0.05 % OPHTHALMIC EMULSION    Place 1 drop into both eyes 2 (two) times daily.   SACCHAROMYCES BOULARDII (FLORASTOR) 250 MG CAPSULE    Take 250 mg by mouth 2 (two) times daily.   SILDENAFIL (VIAGRA) 25 MG TABLET    Take 1-2 tablets by mouth as needed  Modified Medications   No medications on file  Discontinued Medications   No medications on file    Subjective: Brett Sims is in for his routine HIV follow-up visit.  He has had no problems obtaining, taking or tolerating his Genvoya and has not missed any doses until he was hospitalized recently with worsening of his chronic diarrhea.  Work-up at that time did not show any infectious cause of his diarrhea.  He was started on colestipol and his diarrhea has improved greatly.  During his work-up there was the incidental finding of probable left renal cell carcinoma.  He is scheduled to see Dr. Lattie Haw at the cancer center next week.  Review of Systems: Review of Systems  Constitutional: Negative for chills, diaphoresis and fever.  Gastrointestinal: Positive for diarrhea. Negative for abdominal pain, nausea and vomiting.    Past Medical History:  Diagnosis Date  . Cholecystolithiasis   . Complication of anesthesia   . GERD (gastroesophageal reflux disease)   . HIV disease ([place])   . PONV (postoperative nausea and vomiting)     Social History   Tobacco Use  . Smoking status: Never Smoker  . Smokeless tobacco: Never Used  Substance Use Topics  . Alcohol use: Yes    Comment: occassionally  . Drug use: No    No family history on file.  Allergies  Allergen Reactions  . Codeine Other (See Comments)    Mood changes   . Erythromycin Nausea And Vomiting  . Sulfonamide Derivatives Nausea And Vomiting  .  Sulfamethoxazole Rash    Health Maintenance  Topic Date Due  . TETANUS/TDAP  04/23/1967  . INFLUENZA VACCINE  05/10/2018  . COLONOSCOPY  08/19/2024  . Hepatitis C Screening  Completed  . PNA vac Low Risk Adult  Completed    Objective:  Vitals:   07/12/18 1340  BP: 126/75  Pulse: 72  Temp: 97.7 F (36.5 C)  Weight: 188 lb (85.3 kg)  Height: 5\' 10"  (1.778 m)   Body mass index is 26.98 kg/m.  Physical Exam  Constitutional: He is oriented to person, place, and time.  He is in good spirits as usual.  Cardiovascular: Normal rate, regular rhythm and normal heart sounds.  Pulmonary/Chest: Effort normal and breath sounds normal.  Abdominal: Soft. He exhibits distension. There is no tenderness.  Neurological: He is alert and oriented to person, place, and time.  Skin: No rash noted.  Psychiatric: He has a normal mood and affect.    Lab Results Lab Results  Component Value Date   WBC 4.0 07/06/2018   HGB 11.7 (L) 07/06/2018   HCT 33.5 (L) 07/06/2018   MCV 87.9 07/06/2018   PLT 108 (L) 07/06/2018    Lab Results  Component Value Date   CREATININE 1.09 07/06/2018   BUN 13 07/06/2018   NA 142 07/06/2018   K 3.5 07/06/2018   CL 111 07/06/2018   CO2 26 07/06/2018    Lab Results  Component Value Date   ALT 34 07/04/2018   AST 25 07/04/2018   ALKPHOS 58 07/04/2018   BILITOT 1.4 (H) 07/04/2018    Lab Results  Component Value Date   CHOL 106 06/20/2018   HDL 31 (L) 06/20/2018   LDLCALC 50 06/20/2018   TRIG 176 (H) 06/20/2018   CHOLHDL 3.4 06/20/2018   Lab Results  Component Value Date   LABRPR NON-REACTIVE 06/20/2018   HIV 1 RNA Quant (copies/mL)  Date Value  06/20/2018 <20 NOT DETECTED  06/22/2017 <20 NOT DETECTED  07/20/2016 <20   CD4 T Cell Abs (/uL)  Date Value  06/20/2018 330 (L)  06/22/2017 290 (L)  07/20/2016 420     Problem List Items Addressed This Visit      High   HIV (human immunodeficiency virus infection) ([place])    His infection  remains under excellent, long-term control.  He will continue Genvoya and follow-up after lab work in 1 year.  He was counseled to take his colestipol in the evening well apart from his Genvoya because of a drug drug interaction can reduce Genvoya levels.  He received his influenza vaccination today.      Relevant Orders   CBC   T-helper cell (CD4)- ([facility] clinic only)  Comprehensive metabolic panel   Lipid panel   RPR   HIV-1 RNA quant-no reflex-bld    Other Visit Diagnoses    Need for immunization against influenza       Relevant Orders   Flu Vaccine QUAD 36+ mos IM (Completed)        Michel Bickers, MD [facility] for [place] [phone] pager   [phone] cell 07/12/2018, 2:06 PM

## 2018-07-12 NOTE — Assessment & Plan Note (Signed)
His infection remains under excellent, long-term control.  He will continue Genvoya and follow-up after lab work in 1 year.  He was counseled to take his colestipol in the evening well apart from his Genvoya because of a drug drug interaction can reduce Genvoya levels.  He received his influenza vaccination today.

## 2018-07-13 ENCOUNTER — Other Ambulatory Visit: Payer: Self-pay | Admitting: Hematology

## 2018-07-13 DIAGNOSIS — N2889 Other specified disorders of kidney and ureter: Secondary | ICD-10-CM

## 2018-07-16 ENCOUNTER — Encounter: Payer: Self-pay | Admitting: Hematology

## 2018-07-16 ENCOUNTER — Other Ambulatory Visit: Payer: Self-pay | Admitting: Hematology

## 2018-07-16 ENCOUNTER — Inpatient Hospital Stay: Payer: Medicare PPO | Attending: Hematology

## 2018-07-16 ENCOUNTER — Inpatient Hospital Stay (HOSPITAL_BASED_OUTPATIENT_CLINIC_OR_DEPARTMENT_OTHER): Payer: Medicare PPO | Admitting: Hematology

## 2018-07-16 VITALS — BP 127/65 | HR 70 | Temp 97.9°F | Resp 18 | Wt 187.0 lb

## 2018-07-16 DIAGNOSIS — Z9049 Acquired absence of other specified parts of digestive tract: Secondary | ICD-10-CM | POA: Insufficient documentation

## 2018-07-16 DIAGNOSIS — N2889 Other specified disorders of kidney and ureter: Secondary | ICD-10-CM

## 2018-07-16 DIAGNOSIS — D649 Anemia, unspecified: Secondary | ICD-10-CM | POA: Insufficient documentation

## 2018-07-16 DIAGNOSIS — B20 Human immunodeficiency virus [HIV] disease: Secondary | ICD-10-CM | POA: Insufficient documentation

## 2018-07-16 DIAGNOSIS — D61818 Other pancytopenia: Secondary | ICD-10-CM | POA: Insufficient documentation

## 2018-07-16 DIAGNOSIS — D696 Thrombocytopenia, unspecified: Secondary | ICD-10-CM | POA: Insufficient documentation

## 2018-07-16 DIAGNOSIS — R161 Splenomegaly, not elsewhere classified: Secondary | ICD-10-CM | POA: Insufficient documentation

## 2018-07-16 DIAGNOSIS — K760 Fatty (change of) liver, not elsewhere classified: Secondary | ICD-10-CM | POA: Diagnosis not present

## 2018-07-16 DIAGNOSIS — Z79899 Other long term (current) drug therapy: Secondary | ICD-10-CM

## 2018-07-16 DIAGNOSIS — K5730 Diverticulosis of large intestine without perforation or abscess without bleeding: Secondary | ICD-10-CM | POA: Diagnosis not present

## 2018-07-16 DIAGNOSIS — N281 Cyst of kidney, acquired: Secondary | ICD-10-CM | POA: Insufficient documentation

## 2018-07-16 LAB — CBC WITH DIFFERENTIAL (CANCER CENTER ONLY)
BASOS ABS: 0 10*3/uL (ref 0.0–0.1)
Basophils Relative: 0 %
Eosinophils Absolute: 0.1 10*3/uL (ref 0.0–0.5)
Eosinophils Relative: 2 %
HCT: 34.5 % — ABNORMAL LOW (ref 38.7–49.9)
Hemoglobin: 12.3 g/dL — ABNORMAL LOW (ref 13.0–17.1)
Lymphocytes Relative: 23 %
Lymphs Abs: 1 10*3/uL (ref 0.9–3.3)
MCH: 31.4 pg (ref 28.0–33.4)
MCHC: 35.7 g/dL (ref 32.0–35.9)
MCV: 88 fL (ref 82.0–98.0)
Monocytes Absolute: 0.3 10*3/uL (ref 0.1–0.9)
Monocytes Relative: 7 %
Neutro Abs: 2.9 10*3/uL (ref 1.5–6.5)
Neutrophils Relative %: 68 %
Platelet Count: 122 10*3/uL — ABNORMAL LOW (ref 145–400)
RBC: 3.92 MIL/uL — AB (ref 4.20–5.70)
RDW: 14 % (ref 11.1–15.7)
WBC Count: 4.3 10*3/uL (ref 4.0–10.0)

## 2018-07-16 LAB — CMP (CANCER CENTER ONLY)
ALBUMIN: 3.7 g/dL (ref 3.5–5.0)
ALK PHOS: 63 U/L (ref 26–84)
ALT: 52 U/L — ABNORMAL HIGH (ref 10–47)
AST: 36 U/L (ref 11–38)
Anion gap: 2 — ABNORMAL LOW (ref 5–15)
BUN: 14 mg/dL (ref 7–22)
CALCIUM: 8.9 mg/dL (ref 8.0–10.3)
CO2: 29 mmol/L (ref 18–33)
CREATININE: 1.2 mg/dL (ref 0.60–1.20)
Chloride: 112 mmol/L — ABNORMAL HIGH (ref 98–108)
Glucose, Bld: 139 mg/dL — ABNORMAL HIGH (ref 73–118)
Potassium: 3.8 mmol/L (ref 3.3–4.7)
Sodium: 143 mmol/L (ref 128–145)
Total Bilirubin: 1.2 mg/dL (ref 0.2–1.6)
Total Protein: 6.5 g/dL (ref 6.4–8.1)

## 2018-07-16 NOTE — Progress Notes (Signed)
[place] NOTE  Patient Care Team: [name], MD as PCP - General (Internal Medicine) Michel Bickers, MD as PCP - Infectious Diseases (Infectious Diseases)  ASSESSMENT & PLAN:  Renal mass I reviewed the patient's records, including the recent hospitalization. I independently reviewed the recent CT images of abdomen pelvis on 07/03/2018 and agreed with the findings. CT showed an incidental 2.1 cm enhancing abnormality in the midpole of the left kidney, concerning for neoplasm.  There was no suspicious pelvic adenopathy. Based on imaging findings, this may represent Stage I renal cell carcinoma, in which case the treatment is surgical resection. Therefore, I will refer the patient to urology for further evaluation.   HIV (human immunodeficiency virus infection) ([place]) Currently well controlled on Genvoya; HIV quant undetectable in early September 2019. Review of literature suggests that patient with HIV has a slightly high risk of renal cell carcinoma. I encouraged patient to adhere his HAART therapy.   Thrombocytopenia ([place]) Patient has had a mild thrombocytopenia dating back to 2011. Platelet count fluctuates between 100 and 130k. Patient denies any symptoms of abnormal bleeding or bruising, such as hematuria, hematochezia, or melena.  No indication for further work-up at this point.   I educated the patient on concerning symptoms (as aforementioned), which should prompt further evaluation.   Normocytic anemia Hemoglobin fluctuates between low normal (~12) and normal dating back to 2015. Most likely secondary to HAART.  No further work-up at this time. Patient is up-to-date with screening colonoscopy (last in 2015). I encouraged patient to follow-up with PCP for age-appropriate cancer screening as indicated.   Orders Placed This Encounter  Procedures  . Ambulatory referral to Urology    Referral Priority:   Routine    Referral Type:   Consultation     Referral Reason:   Specialty Services Required    Requested Specialty:   Urology    Number of Visits Requested:   1   All questions were answered. The patient knows to call the clinic with any problems, questions or concerns.  Return to clinic in 2 months for follow-up of urology work-up.   Tish Men, MD 07/16/2018 11:58 AM   CHIEF COMPLAINTS/PURPOSE OF CONSULTATION:  "I am here for a kidney mass"  HISTORY OF PRESENTING ILLNESS:  Brett Sims 72 y.o. male is here because of newly diagnosed left renal mass.   Patient was recently hospitalized for persistent diarrhea and was found with ileus on CT imaging.  Incidentally, a left renal mass measuring 2.1 cm was noted on CT.  He reports that since discharge, the diarrhea has resolved.  He denies any fever, chills, weight loss, night sweats, chest pain, dyspnea, abdominal pain, hematuria, or dysuria.  There is no family history of malignancy.  MEDICAL HISTORY:  Past Medical History:  Diagnosis Date  . Cholecystolithiasis   . Complication of anesthesia   . GERD (gastroesophageal reflux disease)   . HIV disease ([place])   . PONV (postoperative nausea and vomiting)     SURGICAL HISTORY: Past Surgical History:  Procedure Laterality Date  . CHOLECYSTECTOMY N/A 04/24/2014   Procedure: LAPAROSCOPIC CHOLECYSTECTOMY;  Surgeon: Harl Bowie, MD;  Location: [place];  Service: General;  Laterality: N/A;  . COLONOSCOPY WITH PROPOFOL N/A 08/19/2014   Procedure: COLONOSCOPY WITH PROPOFOL;  Surgeon: Garlan Fair, MD;  Location: [facility];  Service: Endoscopy;  Laterality: N/A;  . EYE SURGERY     bil. cataract surgery with lens implants  . HERNIA REPAIR    .  INNER EAR SURGERY    . 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 POLYPECTOMY  2010    removed one foot of intestine    by Dr Wynetta Emery    SOCIAL HISTORY: Social History   Socioeconomic History  . Marital status: Married    Spouse name: Not on file  . Number of children: Not on file   . Years of education: Not on file  . Highest education level: Not on file  Occupational History  . Not on file  Social Needs  . Financial resource strain: Not on file  . Food insecurity:    Worry: Not on file    Inability: Not on file  . Transportation needs:    Medical: Not on file    Non-medical: Not on file  Tobacco Use  . Smoking status: Never Smoker  . Smokeless tobacco: Never Used  Substance and Sexual Activity  . Alcohol use: Yes    Comment: occassionally  . Drug use: No  . Sexual activity: Yes    Partners: Male    Comment: declined condoms  Lifestyle  . Physical activity:    Days per week: Not on file    Minutes per session: Not on file  . Stress: Not on file  Relationships  . Social connections:    Talks on phone: Not on file    Gets together: Not on file    Attends religious service: Not on file    Active member of club or organization: Not on file    Attends meetings of clubs or organizations: Not on file    Relationship status: Not on file  . Intimate partner violence:    Fear of current or ex partner: Not on file    Emotionally abused: Not on file    Physically abused: Not on file    Forced sexual activity: Not on file  Other Topics Concern  . Not on file  Social History Narrative  . Not on file    FAMILY HISTORY: History reviewed. No pertinent family history.  ALLERGIES:  is allergic to codeine; erythromycin; sulfonamide derivatives; and sulfamethoxazole.  MEDICATIONS:  Current Outpatient Medications  Medication Sig Dispense Refill  . allopurinol (ZYLOPRIM) 100 MG tablet Take 200 mg by mouth daily.  3  . amLODipine (NORVASC) 10 MG tablet Take 1 tablet (10 mg total) by mouth daily. 30 tablet 0  . carboxymethylcellulose (REFRESH TEARS) 0.5 % SOLN Place 1 drop into both eyes 3 (three) times daily as needed (dry eyes).    . Clindamycin Phosphate foam Apply 1 application topically 2 (two) times daily as needed (dermatitis).     . colestipol  (COLESTID) 1 g tablet Take 1 tablet (1 g total) by mouth daily. 30 tablet 0  . COMBIGAN 0.2-0.5 % ophthalmic solution Place 1 drop into both eyes daily.    . GENVOYA 150-150-200-10 MG TABS tablet TAKE 1 TABLET BY MOUTH ONCE DAILY WITH BREAKFAST (Patient taking differently: Take 1 tablet by mouth daily. ) 30 tablet 1  . ketoconazole (NIZORAL) 2 % cream Apply 1 application topically daily as needed for irritation.    Marland Kitchen latanoprost (XALATAN) 0.005 % ophthalmic solution 1 drop at bedtime.    Marland Kitchen loperamide (IMODIUM A-D) 2 MG tablet Take 2 mg by mouth every morning.     . Multiple Vitamin (MULTIVITAMIN) tablet Take 1 tablet by mouth every morning.     . mupirocin ointment (BACTROBAN) 2 % Place 1 application into the nose daily as  needed ("bump").    . pantoprazole (PROTONIX) 40 MG tablet Take 40 mg by mouth daily.  3  . pregabalin (LYRICA) 25 MG capsule Take 2 capsules (50 mg total) by mouth at bedtime. 20 capsule 0  . RESTASIS 0.05 % ophthalmic emulsion Place 1 drop into both eyes 2 (two) times daily.  3  . saccharomyces boulardii (FLORASTOR) 250 MG capsule Take 250 mg by mouth 2 (two) times daily.    . sildenafil (VIAGRA) 25 MG tablet Take 1-2 tablets by mouth as needed (Patient taking differently: Take 50-75 mg by mouth as needed for erectile dysfunction. Take 1-2 tablets by mouth as needed) 30 tablet 1   No current facility-administered medications for this visit.     REVIEW OF SYSTEMS:   Constitutional: ( - ) fevers, ( - )  chills , ( - ) night sweats Eyes: ( - ) blurriness of vision, ( - ) double vision, ( - ) watery eyes Ears, nose, mouth, throat, and face: ( - ) mucositis, ( - ) sore throat Respiratory: ( - ) cough, ( - ) dyspnea, ( - ) wheezes Cardiovascular: ( - ) palpitation, ( - ) chest discomfort, ( - ) lower extremity swelling Gastrointestinal:  ( - ) nausea, ( - ) heartburn, ( - ) change in bowel habits Skin: ( - ) abnormal skin rashes Lymphatics: ( - ) new lymphadenopathy, ( - )  easy bruising Neurological: ( - ) numbness, ( - ) tingling, ( - ) new weaknesses Behavioral/Psych: ( - ) mood change, ( - ) new changes  All other systems were reviewed with the patient and are negative.  PHYSICAL EXAMINATION: ECOG PERFORMANCE STATUS: 0 - Asymptomatic  Vitals:   07/16/18 1121  BP: 127/65  Pulse: 70  Resp: 18  Temp: 97.9 F (36.6 C)  SpO2: 100%   Filed Weights   07/16/18 1121  Weight: 187 lb (84.8 kg)    GENERAL: alert, no distress and comfortable SKIN: skin color, texture, turgor are normal, no rashes or significant lesions EYES: conjunctiva are pink and non-injected, sclera clear OROPHARYNX: no exudate, no erythema; lips, buccal mucosa, and tongue normal  NECK: supple, non-tender LYMPH:  no palpable lymphadenopathy in the cervical or axillary region  LUNGS: clear to auscultation and percussion with normal breathing effort HEART: regular rate & rhythm and no murmurs and no lower extremity edema ABDOMEN: soft, non-tender, non-distended, normal bowel sounds Musculoskeletal: no cyanosis of digits and no clubbing  PSYCH: alert & oriented x 3, fluent speech NEURO: no focal motor/sensory deficits  LABORATORY DATA:  I have reviewed the data as listed Lab Results  Component Value Date   WBC 4.3 07/16/2018   HGB 12.3 (L) 07/16/2018   HCT 34.5 (L) 07/16/2018   MCV 88.0 07/16/2018   PLT 122 (L) 07/16/2018   Lab Results  Component Value Date   NA 143 07/16/2018   K 3.8 07/16/2018   CL 112 (H) 07/16/2018   CO2 29 07/16/2018   I personally reviewed the patient's peripheral blood smear today.  There was no peripheral blast.  The white blood cells and red blood cells were of normal morphology. There was no schistocytosis.  The platelets are of normal size and I have verified that there were no platelet clumping.  RADIOGRAPHIC STUDIES: I have personally reviewed the radiological images as listed and agreed with the findings in the report. Ct Abdomen Pelvis W  Contrast  Result Date: 07/03/2018 CLINICAL DATA:  Acute generalized abdominal pain. EXAM:  CT ABDOMEN AND PELVIS WITH CONTRAST TECHNIQUE: Multidetector CT imaging of the abdomen and pelvis was performed using the standard protocol following bolus administration of intravenous contrast. CONTRAST:  176mL ISOVUE-300 IOPAMIDOL (ISOVUE-300) INJECTION 61% COMPARISON:  CT scan of April 24, 2014. FINDINGS: Lower chest: No acute abnormality. Hepatobiliary: No focal liver abnormality is seen. Status post cholecystectomy. No biliary dilatation. Pancreas: Unremarkable. No pancreatic ductal dilatation or surrounding inflammatory changes. Spleen: Normal in size without focal abnormality. Adrenals/Urinary Tract: Adrenal glands appear normal. Bilateral simple renal cysts are noted. No hydronephrosis or renal obstruction is noted. No renal or ureteral calculi are noted. Urinary bladder is unremarkable. 2.1 cm rounded enhancing abnormality is seen in midpole of left kidney which which is concerning for neoplasm. Stomach/Bowel: The stomach appears normal. The appendix appears normal. No colonic dilatation is noted. Mildly dilated small bowel loops are noted in the central portion of the abdomen which may represent focal ileus. Vascular/Lymphatic: No significant vascular findings are present. No enlarged abdominal or pelvic lymph nodes. Reproductive: Prostate is unremarkable. Other: No abdominal wall hernia or abnormality. No abdominopelvic ascites. Musculoskeletal: No acute or significant osseous findings. IMPRESSION: Mildly dilated small bowel loops are noted in central portion of abdomen concerning for possible ileus or less likely small bowel obstruction. Continued radiographic follow-up is recommended. 2.1 cm rounded enhancing abnormality seen in the midpole cortex of the left kidney which is concerning for possible neoplasm or malignancy. MRI is recommended for further evaluation. Electronically Signed   By: Marijo Conception, M.D.    On: 07/03/2018 19:02   Dg Abd 2 Views  Result Date: 07/03/2018 CLINICAL DATA:  Abdominal pain with nausea and vomiting EXAM: ABDOMEN - 2 VIEW COMPARISON:  None. FINDINGS: Scattered large and small bowel gas is noted. Multiple dilated loops of small bowel with differential air-fluid levels are seen consistent with at least a partial small bowel obstruction. No free air is noted. No acute bony abnormality is seen. No mass lesion is noted. IMPRESSION: Changes consistent with a least partial small bowel obstruction. CT is recommended for further evaluation. These results will be called to the ordering clinician or representative by the Radiologist Assistant, and communication documented in the PACS or zVision Dashboard. Electronically Signed   By: Inez Catalina M.D.   On: 07/03/2018 15:16   Dg Abd Portable 1v-small Bowel Obstruction Protocol-initial, 8 Hr Delay  Result Date: 07/04/2018 CLINICAL DATA:  8 hour delay film on small-bowel obstruction EXAM: PORTABLE ABDOMEN - 1 VIEW COMPARISON:  07/03/2018 FINDINGS: Scattered large and small bowel gas is seen. Mild small-bowel dilatation is again identified and stable from the prior CT examination. Administered contrast now lies throughout the colon. No bony abnormality is noted. Nasogastric catheter remains in the stomach. IMPRESSION: Administered contrast now lies throughout the colon. Mild persistent dilatation of small bowel loops most consistent with a partial small bowel obstruction. Electronically Signed   By: Inez Catalina M.D.   On: 07/04/2018 08:32   Dg Abd Portable 1v-small Bowel Protocol-position Verification  Result Date: 07/03/2018 CLINICAL DATA:  Nasogastric tube placement EXAM: PORTABLE ABDOMEN - 1 VIEW COMPARISON:  None. FINDINGS: The tip and side port of the nasogastric tube project over the stomach. IMPRESSION: NG tube tip and side-port in the stomach. Electronically Signed   By: Ulyses Jarred M.D.   On: 07/03/2018 21:21   Ct Renal Abd  W/wo  Result Date: 07/04/2018 CLINICAL DATA:  Inpatient.  Suspicious left renal mass on recent CT. EXAM: CT ABDOMEN WITHOUT AND WITH  CONTRAST TECHNIQUE: Multidetector CT imaging of the abdomen was performed following the standard protocol before and following the bolus administration of intravenous contrast. CONTRAST:  133mL OMNIPAQUE IOHEXOL 300 MG/ML  SOLN COMPARISON:  07/03/2018 CT abdomen/pelvis. FINDINGS: Lower chest: No significant pulmonary nodules or acute consolidative airspace disease. Coarse calcified right parasagittal and right hilar nodes from prior granulomatous disease. Hepatobiliary: Diffuse hepatic steatosis. No definite liver surface irregularity. Subcentimeter hypodense segment 4A left liver lobe lesion is too small to characterize. No additional liver mass. Cholecystectomy. Bile ducts are within normal post cholecystectomy limits. Pancreas: Normal, with no mass or duct dilation. Spleen: Mild splenomegaly (craniocaudal splenic length 15.0 cm). No splenic mass. Adrenals/Urinary Tract: Normal adrenals. No renal stones. No hydronephrosis. Avidly enhancing 2.0 x 1.8 cm renal cortical mass in the interpolar left kidney (series 11/image 71). 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 Small simple bilateral renal cysts, largest 1.9 cm in the upper right kidney. Additional subcentimeter hypodense renal cortical lesions in both kidneys, too small to characterize. Stomach/Bowel: Enteric tube terminates in the distal body of the stomach. Stomach is collapsed and otherwise normal. Mildly dilated small bowel loops with air-fluid levels throughout the visualized abdomen measuring up to the 3.2 cm diameter, decreased from 3.7 cm. No wall thickening in the visualized bowel. Moderate left colonic diverticulosis. Oral contrast transits to the left colon. Vascular/Lymphatic: Atherosclerotic  nonaneurysmal abdominal aorta. Patent portal, splenic, hepatic and renal veins. No pathologically enlarged lymph nodes in the abdomen. Other: No pneumoperitoneum. Trace perihepatic ascites. No focal fluid collection. Musculoskeletal: No aggressive appearing focal osseous lesions. Mild thoracolumbar spondylosis. IMPRESSION: 1. 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 Patent renal veins with no tumor thrombus. No evidence of metastatic disease in the abdomen. 2. Mildly dilated small bowel loops with air-fluid levels throughout the visualized abdomen, mildly improved compared to the CT study from 1 day prior. Oral contrast transits to the left colon. Findings suggest improving mild small-bowel obstruction or ileus. 3. Mild splenomegaly.  Trace perihepatic ascites. 4. Diffuse hepatic steatosis. No overt morphologic findings of hepatic cirrhosis. 5.  Aortic Atherosclerosis (ICD10-I70.0). Electronically Signed   By: Ilona Sorrel M.D.   On: 07/04/2018 19:09

## 2018-07-16 NOTE — Assessment & Plan Note (Signed)
Currently well controlled on Genvoya; HIV quant undetectable in early September 2019. Review of literature suggests that patient with HIV has a slightly high risk of renal cell carcinoma. I encouraged patient to adhere his HAART therapy.

## 2018-07-16 NOTE — Assessment & Plan Note (Signed)
Hemoglobin fluctuates between low normal (~12) and normal dating back to 2015. Most likely secondary to HAART.  No further work-up at this time. Patient is up-to-date with screening colonoscopy (last in 2015). I encouraged patient to follow-up with PCP for age-appropriate cancer screening as indicated.

## 2018-07-16 NOTE — Assessment & Plan Note (Addendum)
I reviewed the patient's records, including the recent hospitalization. I independently reviewed the recent CT images of abdomen pelvis on 07/03/2018 and agreed with the findings. CT showed an incidental 2.1 cm enhancing abnormality in the midpole of the left kidney, concerning for neoplasm.  There was no suspicious pelvic adenopathy. Based on imaging findings, this may represent Stage I renal cell carcinoma, in which case the treatment is surgical resection. Therefore, I will refer the patient to urology for further evaluation.

## 2018-07-16 NOTE — Assessment & Plan Note (Signed)
Patient has had a mild thrombocytopenia dating back to 2011. Platelet count fluctuates between 100 and 130k. Patient denies any symptoms of abnormal bleeding or bruising, such as hematuria, hematochezia, or melena.  No indication for further work-up at this point.   I educated the patient on concerning symptoms (as aforementioned), which should prompt further evaluation.

## 2018-07-17 DIAGNOSIS — C434 Malignant melanoma of scalp and neck: Secondary | ICD-10-CM | POA: Diagnosis not present

## 2018-07-17 DIAGNOSIS — Z1283 Encounter for screening for malignant neoplasm of skin: Secondary | ICD-10-CM | POA: Diagnosis not present

## 2018-07-17 DIAGNOSIS — L821 Other seborrheic keratosis: Secondary | ICD-10-CM | POA: Diagnosis not present

## 2018-07-17 DIAGNOSIS — C4362 Malignant melanoma of left upper limb, including shoulder: Secondary | ICD-10-CM | POA: Diagnosis not present

## 2018-07-17 DIAGNOSIS — C4359 Malignant melanoma of other part of trunk: Secondary | ICD-10-CM | POA: Diagnosis not present

## 2018-07-17 DIAGNOSIS — Z08 Encounter for follow-up examination after completed treatment for malignant neoplasm: Secondary | ICD-10-CM | POA: Diagnosis not present

## 2018-07-17 DIAGNOSIS — Z85820 Personal history of malignant melanoma of skin: Secondary | ICD-10-CM | POA: Diagnosis not present

## 2018-07-20 ENCOUNTER — Ambulatory Visit: Payer: Self-pay | Admitting: Hematology

## 2018-07-20 ENCOUNTER — Other Ambulatory Visit: Payer: Self-pay

## 2018-07-20 DIAGNOSIS — N289 Disorder of kidney and ureter, unspecified: Secondary | ICD-10-CM | POA: Diagnosis not present

## 2018-07-20 DIAGNOSIS — K567 Ileus, unspecified: Secondary | ICD-10-CM | POA: Diagnosis not present

## 2018-07-20 DIAGNOSIS — K529 Noninfective gastroenteritis and colitis, unspecified: Secondary | ICD-10-CM | POA: Diagnosis not present

## 2018-08-07 DIAGNOSIS — N390 Urinary tract infection, site not specified: Secondary | ICD-10-CM | POA: Diagnosis not present

## 2018-08-07 DIAGNOSIS — C4359 Malignant melanoma of other part of trunk: Secondary | ICD-10-CM | POA: Diagnosis not present

## 2018-08-07 DIAGNOSIS — L7682 Other postprocedural complications of skin and subcutaneous tissue: Secondary | ICD-10-CM | POA: Diagnosis not present

## 2018-08-10 DIAGNOSIS — E538 Deficiency of other specified B group vitamins: Secondary | ICD-10-CM | POA: Diagnosis not present

## 2018-08-31 ENCOUNTER — Other Ambulatory Visit: Payer: Self-pay | Admitting: Internal Medicine

## 2018-08-31 DIAGNOSIS — B20 Human immunodeficiency virus [HIV] disease: Secondary | ICD-10-CM

## 2018-09-05 DIAGNOSIS — D4102 Neoplasm of uncertain behavior of left kidney: Secondary | ICD-10-CM | POA: Diagnosis not present

## 2018-09-05 DIAGNOSIS — N281 Cyst of kidney, acquired: Secondary | ICD-10-CM | POA: Diagnosis not present

## 2018-09-10 DIAGNOSIS — D510 Vitamin B12 deficiency anemia due to intrinsic factor deficiency: Secondary | ICD-10-CM | POA: Diagnosis not present

## 2018-09-18 ENCOUNTER — Ambulatory Visit: Payer: Self-pay | Admitting: Hematology

## 2018-09-18 DIAGNOSIS — D485 Neoplasm of uncertain behavior of skin: Secondary | ICD-10-CM | POA: Diagnosis not present

## 2018-09-18 DIAGNOSIS — D2261 Melanocytic nevi of right upper limb, including shoulder: Secondary | ICD-10-CM | POA: Diagnosis not present

## 2018-09-18 DIAGNOSIS — Z1283 Encounter for screening for malignant neoplasm of skin: Secondary | ICD-10-CM | POA: Diagnosis not present

## 2018-09-18 DIAGNOSIS — D225 Melanocytic nevi of trunk: Secondary | ICD-10-CM | POA: Diagnosis not present

## 2018-09-18 DIAGNOSIS — L080 Pyoderma: Secondary | ICD-10-CM | POA: Diagnosis not present

## 2018-09-21 DIAGNOSIS — D49512 Neoplasm of unspecified behavior of left kidney: Secondary | ICD-10-CM | POA: Diagnosis not present

## 2018-09-25 ENCOUNTER — Other Ambulatory Visit (HOSPITAL_COMMUNITY): Payer: Self-pay | Admitting: Urology

## 2018-09-25 DIAGNOSIS — N2889 Other specified disorders of kidney and ureter: Secondary | ICD-10-CM

## 2018-10-04 ENCOUNTER — Other Ambulatory Visit: Payer: Self-pay | Admitting: Radiology

## 2018-10-05 ENCOUNTER — Encounter (HOSPITAL_COMMUNITY): Payer: Self-pay

## 2018-10-05 ENCOUNTER — Other Ambulatory Visit (HOSPITAL_COMMUNITY): Payer: Self-pay | Admitting: Diagnostic Radiology

## 2018-10-05 ENCOUNTER — Ambulatory Visit (HOSPITAL_COMMUNITY)
Admission: RE | Admit: 2018-10-05 | Discharge: 2018-10-05 | Disposition: A | Discharge status: Home / Self Care | Payer: Medicare PPO | Source: Ambulatory Visit | Attending: Urology | Admitting: Urology

## 2018-10-05 DIAGNOSIS — K219 Gastro-esophageal reflux disease without esophagitis: Secondary | ICD-10-CM | POA: Diagnosis not present

## 2018-10-05 DIAGNOSIS — N2889 Other specified disorders of kidney and ureter: Secondary | ICD-10-CM

## 2018-10-05 DIAGNOSIS — Z21 Asymptomatic human immunodeficiency virus [HIV] infection status: Secondary | ICD-10-CM | POA: Insufficient documentation

## 2018-10-05 DIAGNOSIS — Z79899 Other long term (current) drug therapy: Secondary | ICD-10-CM | POA: Insufficient documentation

## 2018-10-05 DIAGNOSIS — D3002 Benign neoplasm of left kidney: Secondary | ICD-10-CM | POA: Insufficient documentation

## 2018-10-05 DIAGNOSIS — N289 Disorder of kidney and ureter, unspecified: Secondary | ICD-10-CM | POA: Diagnosis not present

## 2018-10-05 HISTORY — PX: IR US GUIDANCE: IMG2393

## 2018-10-05 LAB — CBC
HCT: 36.5 % — ABNORMAL LOW (ref 39.0–52.0)
Hemoglobin: 13 g/dL (ref 13.0–17.0)
MCH: 30.9 pg (ref 26.0–34.0)
MCHC: 35.6 g/dL (ref 30.0–36.0)
MCV: 86.7 fL (ref 80.0–100.0)
Platelets: 102 10*3/uL — ABNORMAL LOW (ref 150–400)
RBC: 4.21 MIL/uL — AB (ref 4.22–5.81)
RDW: 13.8 % (ref 11.5–15.5)
WBC: 4 10*3/uL (ref 4.0–10.5)
nRBC: 0 % (ref 0.0–0.2)

## 2018-10-05 LAB — PROTIME-INR
INR: 0.94
PROTHROMBIN TIME: 12.5 s (ref 11.4–15.2)

## 2018-10-05 MED ORDER — MIDAZOLAM HCL 2 MG/2ML IJ SOLN
INTRAMUSCULAR | Status: AC | PRN
  Administered 2018-10-05: 0.5 mg via INTRAVENOUS
  Administered 2018-10-05: 1 mg via INTRAVENOUS

## 2018-10-05 MED ORDER — SODIUM CHLORIDE 0.9 % IV SOLN: INTRAVENOUS | Status: DC

## 2018-10-05 MED ORDER — FENTANYL CITRATE (PF) 100 MCG/2ML IJ SOLN
INTRAMUSCULAR | Status: AC
  Filled 2018-10-05: qty 2

## 2018-10-05 MED ORDER — SODIUM CHLORIDE 0.9 % IV SOLN
INTRAVENOUS | Status: AC | PRN
  Administered 2018-10-05: 10 mL/h via INTRAVENOUS

## 2018-10-05 MED ORDER — LIDOCAINE HCL 1 % IJ SOLN
INTRAMUSCULAR | Status: AC
  Filled 2018-10-05: qty 20

## 2018-10-05 MED ORDER — MIDAZOLAM HCL 2 MG/2ML IJ SOLN
INTRAMUSCULAR | Status: AC
  Filled 2018-10-05: qty 2

## 2018-10-05 MED ORDER — GELATIN ABSORBABLE 12-7 MM EX MISC
CUTANEOUS | Status: AC
  Filled 2018-10-05: qty 1

## 2018-10-05 MED ORDER — FENTANYL CITRATE (PF) 100 MCG/2ML IJ SOLN
INTRAMUSCULAR | Status: AC | PRN
  Administered 2018-10-05: 25 ug via INTRAVENOUS
  Administered 2018-10-05: 50 ug via INTRAVENOUS

## 2018-10-05 NOTE — Sedation Documentation (Signed)
Patient denies pain and is resting comfortably.  

## 2018-10-05 NOTE — Sedation Documentation (Signed)
Patient is resting comfortably. 

## 2018-10-05 NOTE — Discharge Instructions (Addendum)
Percutaneous Kidney Biopsy, Care After °This sheet gives you information about how to care for yourself after your procedure. Your health care provider may also give you more specific instructions. If you have problems or questions, contact your health care provider. °What can I expect after the procedure? °After the procedure, it is common to have: °· Pain or soreness near the area where the needle went through your skin (biopsy site). °· Bright pink or cloudy urine for 24 hours after the procedure. °Follow these instructions at home: °Activity °· Return to your normal activities as told by your health care provider. Ask your health care provider what activities are safe for you. °· Do not drive for 24 hours if you were given a medicine to help you relax (sedative). °· Do not lift anything that is heavier than 10 lb (4.5 kg) until your health care provider tells you that it is safe. °· Avoid activities that take a lot of effort (are strenuous) until your health care provider approves. Most people will have to wait 2 weeks before returning to activities such as exercise or sexual intercourse. °General instructions ° °· Take over-the-counter and prescription medicines only as told by your health care provider. °· You may eat and drink after your procedure. Follow instructions from your health care provider about eating or drinking restrictions. °· Check your biopsy site every day for signs of infection. Check for: °? More redness, swelling, or pain. °? More fluid or blood. °? Warmth. °? Pus or a bad smell. °· Keep all follow-up visits as told by your health care provider. This is important. °Contact a health care provider if: °· You have more redness, swelling, or pain around your biopsy site. °· You have more fluid or blood coming from your biopsy site. °· Your biopsy site feels warm to the touch. °· You have pus or a bad smell coming from your biopsy site. °· You have blood in your urine more than 24 hours after  your procedure. °Get help right away if: °· You have dark red or brown urine. °· You have a fever. °· You are unable to urinate. °· You feel burning when you urinate. °· You feel faint. °· You have severe pain in your abdomen or side. °This information is not intended to replace advice given to you by your health care provider. Make sure you discuss any questions you have with your health care provider. °Document Released: 05/29/2013 Document Revised: 07/08/2016 Document Reviewed: 07/08/2016 °Elsevier Interactive Patient Education © 2019 [employer] °Moderate Conscious Sedation, Adult, Care After °These instructions provide you with information about caring for yourself after your procedure. Your health care provider may also give you more specific instructions. Your treatment has been planned according to current medical practices, but problems sometimes occur. Call your health care provider if you have any problems or questions after your procedure. °What can I expect after the procedure? °After your procedure, it is common: °· To feel sleepy for several hours. °· To feel clumsy and have poor balance for several hours. °· To have poor judgment for several hours. °· To vomit if you eat too soon. °Follow these instructions at home: °For at least 24 hours after the procedure: ° °· Do not: °? Participate in activities where you could fall or become injured. °? Drive. °? Use heavy machinery. °? Drink alcohol. °? Take sleeping pills or medicines that cause drowsiness. °? Make important decisions or sign legal documents. °? Take care of children on   your own. °· Rest. °Eating and drinking °· Follow the diet recommended by your health care provider. °· If you vomit: °? Drink water, juice, or soup when you can drink without vomiting. °? Make sure you have little or no nausea before eating solid foods. °General instructions °· Have a responsible adult stay with you until you are awake and alert. °· Take over-the-counter  and prescription medicines only as told by your health care provider. °· If you smoke, do not smoke without supervision. °· Keep all follow-up visits as told by your health care provider. This is important. °Contact a health care provider if: °· You keep feeling nauseous or you keep vomiting. °· You feel light-headed. °· You develop a rash. °· You have a fever. °Get help right away if: °· You have trouble breathing. °This information is not intended to replace advice given to you by your health care provider. Make sure you discuss any questions you have with your health care provider. °Document Released: 07/17/2013 Document Revised: 02/29/2016 Document Reviewed: 01/16/2016 °Elsevier Interactive Patient Education © 2019 [employer] ° °

## 2018-10-05 NOTE — H&P (Signed)
Chief Complaint: Patient was seen in consultation today for left renal mass bx at the request of Winter,Christopher Marjory Lies  Referring Physician(s): Winter,Christopher Marjory Lies  Supervising Physician: Markus Daft  Patient Status: [facility] - Out-pt  History of Present Illness: Brett Sims is a 72 y.o. male   Left renal mass Noted incidentally approx 2 mo ago Pt was in hospital for bowel obstruction and work up revealed left renal mass  CT 9/25  IMPRESSION: 1. 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 Patent renal veins with no tumor thrombus. No evidence of metastatic disease in the abdomen. 2. Mildly dilated small bowel loops with air-fluid levels throughout the visualized abdomen, mildly improved compared to the CT study from 1 day prior. Oral contrast transits to the left colon. Findings suggest improving mild small-bowel obstruction or ileus. 3. Mild splenomegaly.  Trace perihepatic ascites. 4. Diffuse hepatic steatosis. No overt morphologic findings of hepatic cirrhosis.  Scheduled now for renal mass biopsy  Past Medical History:  Diagnosis Date  . Cholecystolithiasis   . Complication of anesthesia   . GERD (gastroesophageal reflux disease)   . HIV disease ([place])   . PONV (postoperative nausea and vomiting)     Past Surgical History:  Procedure Laterality Date  . CHOLECYSTECTOMY N/A 04/24/2014   Procedure: LAPAROSCOPIC CHOLECYSTECTOMY;  Surgeon: Harl Bowie, MD;  Location: [facility];  Service: General;  Laterality: N/A;  . COLONOSCOPY WITH PROPOFOL N/A 08/19/2014   Procedure: COLONOSCOPY WITH PROPOFOL;  Surgeon: Garlan Fair, MD;  Location: [facility];  Service: Endoscopy;  Laterality: N/A;  . EYE SURGERY     bil. cataract surgery with lens implants  . HERNIA REPAIR    . INNER EAR SURGERY    . 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 POLYPECTOMY  2010    removed one foot of  intestine    by Dr Wynetta Emery    Allergies: Codeine; Erythromycin; Sulfonamide derivatives; and Sulfamethoxazole  Medications: Prior to Admission medications   Medication Sig Start Date End Date Taking? Authorizing Provider  allopurinol (ZYLOPRIM) 100 MG tablet Take 200 mg by mouth daily. 05/22/18  Yes [provider]  carboxymethylcellulose (REFRESH TEARS) 0.5 % SOLN Place 1 drop into both eyes 3 (three) times daily as needed (dry eyes).   Yes [provider]  Clindamycin Phosphate foam Apply 1 application topically 2 (two) times daily as needed (dermatitis).  08/23/13  Yes [provider]  COMBIGAN 0.2-0.5 % ophthalmic solution Place 1 drop into both eyes 2 (two) times daily.  07/03/18  Yes [provider]  GENVOYA 150-150-200-10 MG TABS tablet TAKE 1 TABLET BY MOUTH ONCE DAILY WITH BREAKFAST Patient taking differently: Take 1 tablet by mouth daily with breakfast.  08/31/18  Yes Michel Bickers, MD  latanoprost (XALATAN) 0.005 % ophthalmic solution Place 1 drop into both eyes at bedtime.    Yes [provider]  loperamide (IMODIUM A-D) 2 MG tablet Take 2 mg by mouth every morning.    Yes [provider]  Multiple Vitamin (MULTIVITAMIN) tablet Take 1 tablet by mouth every morning.    Yes [provider]  pantoprazole (PROTONIX) 40 MG tablet Take 40 mg by mouth daily. 06/27/18  Yes [provider]  pregabalin (LYRICA) 50 MG capsule Take 50 mg by mouth 2 (two) times daily.   Yes [provider]  RESTASIS 0.05 % ophthalmic emulsion Place 1 drop into both eyes  2 (two) times daily. 06/11/18  Yes [provider]  sildenafil (VIAGRA) 25 MG tablet Take 1-2 tablets by mouth as needed Patient taking differently: Take 50-75 mg by mouth as needed for erectile dysfunction. Take 1-2 tablets by mouth as needed 12/15/14  Yes Michel Bickers, MD  tamsulosin (FLOMAX) 0.4 MG CAPS capsule Take 0.4 mg by mouth daily.   Yes [provider]  ketoconazole (NIZORAL) 2 % cream Apply 1 application topically daily as needed for irritation.    [provider]  mupirocin ointment (BACTROBAN) 2 % Place 1 application into the nose daily as needed ("bump").    [provider]     History reviewed. No pertinent family history.  Social History   Socioeconomic History  . Marital status: Married    Spouse name: Not on file  . Number of children: Not on file  . Years of education: Not on file  . Highest education level: Not on file  Occupational History  . Not on file  Social Needs  . Financial resource strain: Not on file  . Food insecurity:    Worry: Not on file    Inability: Not on file  . Transportation needs:    Medical: Not on file    Non-medical: Not on file  Tobacco Use  . Smoking status: Never Smoker  . Smokeless tobacco: Never Used  Substance and Sexual Activity  . Alcohol use: Yes    Comment: occassionally  . Drug use: No  . Sexual activity: Yes    Partners: Male    Comment: declined condoms  Lifestyle  . Physical activity:    Days per week: Not on file    Minutes per session: Not on file  . Stress: Not on file  Relationships  . Social connections:    Talks on phone: Not on file    Gets together: Not on file    Attends religious service: Not on file    Active member of club or organization: Not on file    Attends meetings of clubs or organizations: Not on file    Relationship status: Not on file  Other Topics Concern  . Not on file  Social History Narrative  . Not on file    Review of Systems: A 12 point ROS discussed and pertinent positives are indicated in the HPI above.  All other systems are negative.  Review of Systems  Constitutional: Negative for activity change, appetite change, fatigue, fever and unexpected weight change.  Respiratory: Negative for shortness of breath.   Cardiovascular: Negative for chest pain.  Gastrointestinal: Negative for abdominal  pain.  Neurological: Negative for weakness.  Psychiatric/Behavioral: Negative for behavioral problems and confusion.    Vital Signs: BP (!) 142/74   Pulse (!) 59   Temp 97.7 F (36.5 C) (Oral)   Resp 16   Ht 5\' 10"  (1.778 m)   Wt 189 lb (85.7 kg)   SpO2 99%   BMI 27.12 kg/m   Physical Exam Vitals signs reviewed.  Cardiovascular:     Rate and Rhythm: Normal rate and regular rhythm.  Pulmonary:     Effort: Pulmonary effort is normal.     Breath sounds: Normal breath sounds.  Abdominal:     General: Bowel sounds are normal.     Palpations: Abdomen is soft.  Musculoskeletal: Normal range of motion.  Skin:    General: Skin is warm and dry.  Neurological:     General: No focal deficit present.  Mental Status: He is oriented to person, place, and time.  Psychiatric:        Mood and Affect: Mood normal.        Behavior: Behavior normal.        Thought Content: Thought content normal.        Judgment: Judgment normal.     Imaging: No results found.  Labs:  CBC: Recent Labs    07/03/18 1635 07/04/18 0543 07/06/18 0548 07/16/18 1052  WBC 7.8 5.6 4.0 4.3  HGB 16.3 13.4 11.7* 12.3*  HCT 45.4 38.0* 33.5* 34.5*  PLT 156 122* 108* 122*    COAGS: Recent Labs    10/05/18 0541  INR 0.94    BMP: Recent Labs    07/03/18 1635 07/04/18 0543 07/05/18 0531 07/06/18 0548 07/16/18 1052  NA 143 144 144 142 143  K 3.9 3.8 3.6 3.5 3.8  CL 104 110 111 111 112*  CO2 27 28 28 26 29   GLUCOSE 140* 139* 100* 117* 139*  BUN 17 17 15 13 14   CALCIUM 10.3 9.3 8.9 8.4* 8.9  CREATININE 1.29* 1.11 1.18 1.09 1.20  GFRNONAA 55* >60 >60 >60  --   GFRAA >60 >60 >60 >60  --     LIVER FUNCTION TESTS: Recent Labs    06/20/18 0941 07/03/18 1635 07/04/18 0543 07/16/18 1052  BILITOT 1.3* 2.0* 1.4* 1.2  AST 35 36 25 36  ALT 44 46* 34 52*  ALKPHOS  --  77 58 63  PROT 6.2 8.6* 6.7 6.5  ALBUMIN  --  5.3* 4.0 3.7    TUMOR MARKERS: No results for input(s): AFPTM, CEA,  CA199, CHROMGRNA in the last 8760 hours.  Assessment and Plan:  Left renal mass Incidentally found with work during hospitalization for SBO Scheduled now for biopsy of same Risks and benefits discussed with the patient including, but not limited to bleeding, infection, damage to adjacent structures or low yield requiring additional tests.  All of the patient's questions were answered, patient is agreeable to proceed. Consent signed and in chart.   Thank you for this interesting consult.  I greatly enjoyed meeting Ashtyn Freilich and look forward to participating in their care.  A copy of this report was sent to the requesting provider on this date.  Electronically Signed: Lavonia Drafts, PA-C 10/05/2018, 7:18 AM   I spent a total of  30 Minutes   in face to face in clinical consultation, greater than 50% of which was counseling/coordinating care for left renal mass bx

## 2018-10-05 NOTE — Procedures (Signed)
Interventional Radiology Procedure:   Indications: Left renal lesion  Procedure: Imaged guided left renal lesion biopsy  Findings: Needle position confirmed in lesion.  3 cores obtained.  Complications: None     EBL: Minimal  Plan: Bedrest 4 hours.    Jayen Bromwell R. Anselm Pancoast, MD  Pager: [phone]

## 2018-10-12 DIAGNOSIS — D510 Vitamin B12 deficiency anemia due to intrinsic factor deficiency: Secondary | ICD-10-CM | POA: Diagnosis not present

## 2018-11-05 DIAGNOSIS — K439 Ventral hernia without obstruction or gangrene: Secondary | ICD-10-CM | POA: Diagnosis not present

## 2018-11-08 DIAGNOSIS — D3002 Benign neoplasm of left kidney: Secondary | ICD-10-CM | POA: Diagnosis not present

## 2018-11-09 DIAGNOSIS — H16223 Keratoconjunctivitis sicca, not specified as Sjogren's, bilateral: Secondary | ICD-10-CM | POA: Diagnosis not present

## 2018-11-09 DIAGNOSIS — H401111 Primary open-angle glaucoma, right eye, mild stage: Secondary | ICD-10-CM | POA: Diagnosis not present

## 2018-11-09 DIAGNOSIS — Z961 Presence of intraocular lens: Secondary | ICD-10-CM | POA: Diagnosis not present

## 2018-11-09 DIAGNOSIS — H401123 Primary open-angle glaucoma, left eye, severe stage: Secondary | ICD-10-CM | POA: Diagnosis not present

## 2018-11-09 DIAGNOSIS — H0102A Squamous blepharitis right eye, upper and lower eyelids: Secondary | ICD-10-CM | POA: Diagnosis not present

## 2018-11-09 DIAGNOSIS — H0102B Squamous blepharitis left eye, upper and lower eyelids: Secondary | ICD-10-CM | POA: Diagnosis not present

## 2018-11-13 DIAGNOSIS — D510 Vitamin B12 deficiency anemia due to intrinsic factor deficiency: Secondary | ICD-10-CM | POA: Diagnosis not present

## 2018-12-13 DIAGNOSIS — E538 Deficiency of other specified B group vitamins: Secondary | ICD-10-CM | POA: Diagnosis not present

## 2019-03-12 DIAGNOSIS — H401123 Primary open-angle glaucoma, left eye, severe stage: Secondary | ICD-10-CM | POA: Diagnosis not present

## 2019-03-12 DIAGNOSIS — H401111 Primary open-angle glaucoma, right eye, mild stage: Secondary | ICD-10-CM | POA: Diagnosis not present

## 2019-03-12 DIAGNOSIS — Z961 Presence of intraocular lens: Secondary | ICD-10-CM | POA: Diagnosis not present

## 2019-03-12 DIAGNOSIS — H0102A Squamous blepharitis right eye, upper and lower eyelids: Secondary | ICD-10-CM | POA: Diagnosis not present

## 2019-03-12 DIAGNOSIS — H43393 Other vitreous opacities, bilateral: Secondary | ICD-10-CM | POA: Diagnosis not present

## 2019-03-12 DIAGNOSIS — H0102B Squamous blepharitis left eye, upper and lower eyelids: Secondary | ICD-10-CM | POA: Diagnosis not present

## 2019-03-12 DIAGNOSIS — D3131 Benign neoplasm of right choroid: Secondary | ICD-10-CM | POA: Diagnosis not present

## 2019-03-12 DIAGNOSIS — H16223 Keratoconjunctivitis sicca, not specified as Sjogren's, bilateral: Secondary | ICD-10-CM | POA: Diagnosis not present

## 2019-03-19 DIAGNOSIS — Z0000 Encounter for general adult medical examination without abnormal findings: Secondary | ICD-10-CM | POA: Diagnosis not present

## 2019-03-19 DIAGNOSIS — Z1389 Encounter for screening for other disorder: Secondary | ICD-10-CM | POA: Diagnosis not present

## 2019-03-21 ENCOUNTER — Other Ambulatory Visit: Payer: Self-pay | Admitting: Internal Medicine

## 2019-03-21 DIAGNOSIS — B20 Human immunodeficiency virus [HIV] disease: Secondary | ICD-10-CM

## 2019-04-09 DIAGNOSIS — Z125 Encounter for screening for malignant neoplasm of prostate: Secondary | ICD-10-CM | POA: Diagnosis not present

## 2019-04-09 DIAGNOSIS — K219 Gastro-esophageal reflux disease without esophagitis: Secondary | ICD-10-CM | POA: Diagnosis not present

## 2019-04-09 DIAGNOSIS — G629 Polyneuropathy, unspecified: Secondary | ICD-10-CM | POA: Diagnosis not present

## 2019-04-09 DIAGNOSIS — E538 Deficiency of other specified B group vitamins: Secondary | ICD-10-CM | POA: Diagnosis not present

## 2019-04-09 DIAGNOSIS — I10 Essential (primary) hypertension: Secondary | ICD-10-CM | POA: Diagnosis not present

## 2019-04-09 DIAGNOSIS — N400 Enlarged prostate without lower urinary tract symptoms: Secondary | ICD-10-CM | POA: Diagnosis not present

## 2019-04-09 DIAGNOSIS — Z85820 Personal history of malignant melanoma of skin: Secondary | ICD-10-CM | POA: Diagnosis not present

## 2019-04-17 ENCOUNTER — Telehealth: Payer: Self-pay | Admitting: *Deleted

## 2019-04-17 NOTE — Telephone Encounter (Signed)
I think it would be reasonable to get a second opinion.

## 2019-04-17 NOTE — Telephone Encounter (Signed)
Patient is calling to see if Dr Megan Salon can give him advice. He has been seeing dermatologist Dr Nevada Crane for several years, for dermatitis. Dr Nevada Crane prescribes a clindamycin foam, but states Rush Landmark will have this for the rest of his life "due to his immune condition."   Rush Landmark would like to know if 1) this is a reasonable expectation to have this condition for ever or 2) if he should go to another dermatologist for a second opinion -- and if Dr Megan Salon could recommend one. Please advise. Landis Gandy, RN

## 2019-04-24 IMAGING — CT CT ABDOMEN WO/W CM
3 of 12 series · 10 of 46 positions shown, 16 images · IV contrast (APPLIED)
Comparison: 07/03/2018 CT abdomen/pelvis.

CLINICAL DATA: Inpatient.  Suspicious left renal mass on recent CT.

EXAM:
CT ABDOMEN WITHOUT AND WITH CONTRAST
TECHNIQUE: Multidetector CT imaging of the abdomen was performed following the
standard protocol before and following the bolus administration of
intravenous contrast.
CONTRAST:  100mL OMNIPAQUE IOHEXOL 300 MG/ML  SOLN

[Series 2: axial pre · axial · non-contrast · 0.90mm/px · z∈[+1217,+1418]mm · 5 of 101 slices shown, 10 images]
[im 17/101  soft-tissue]
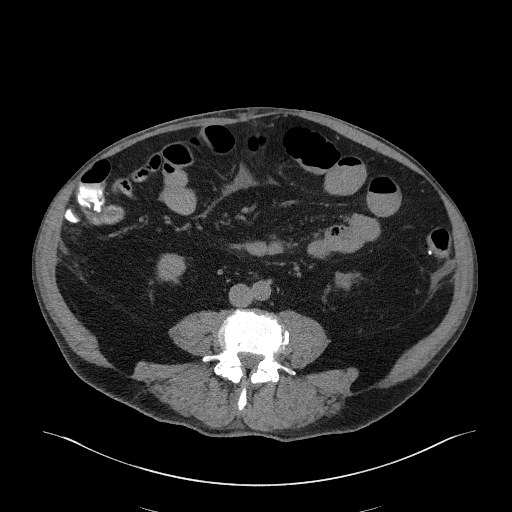
[im 17/101  bone]
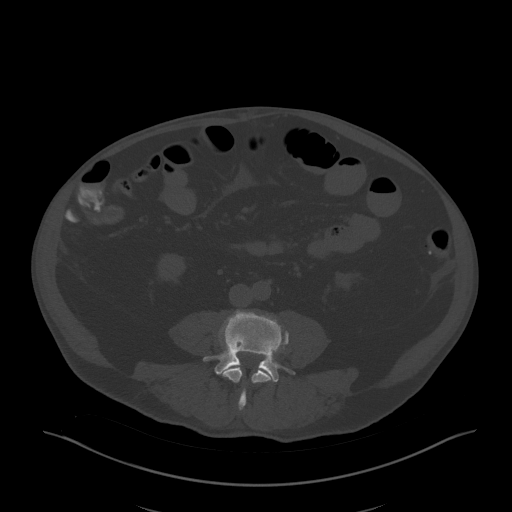
[im 34/101  soft-tissue]
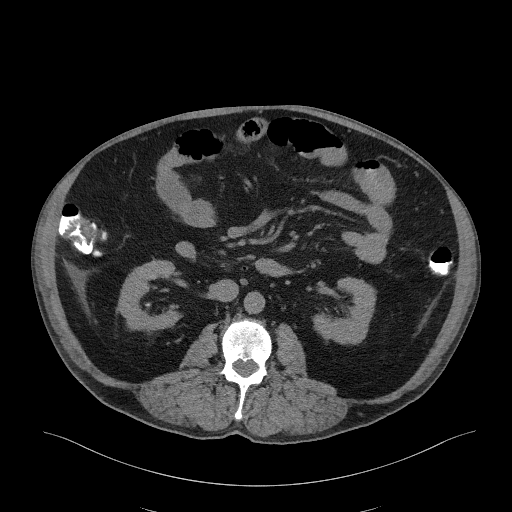
[im 34/101  lung]
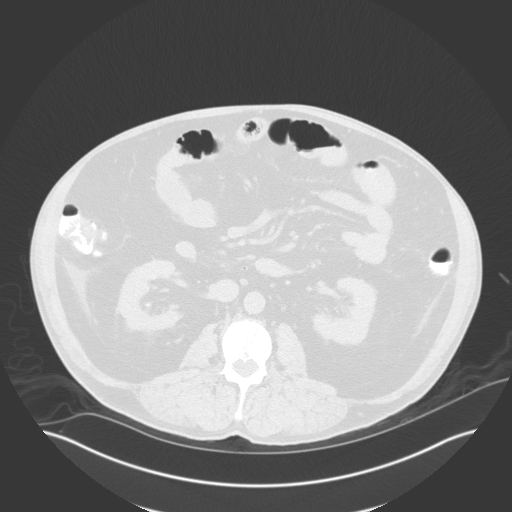
[im 51/101  soft-tissue]
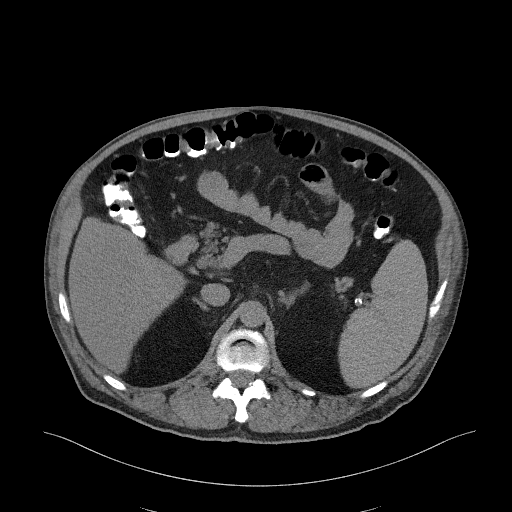
[im 51/101  lung]
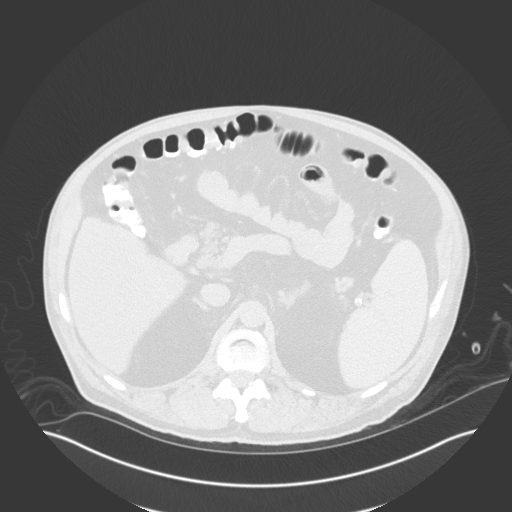
[im 67/101  soft-tissue]
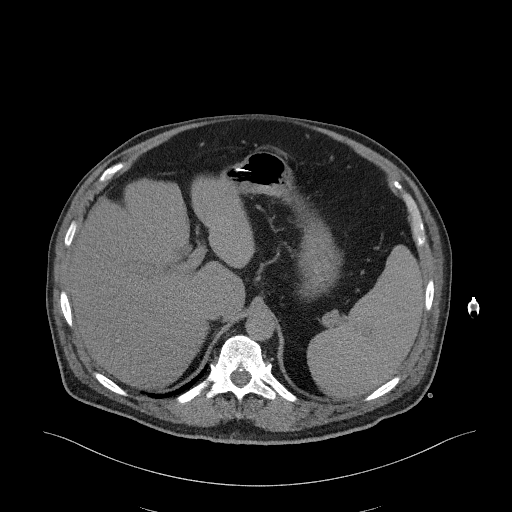
[im 67/101  lung]
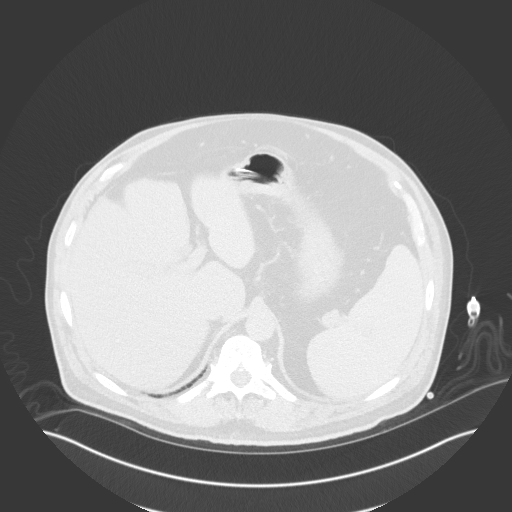
[im 84/101  soft-tissue]
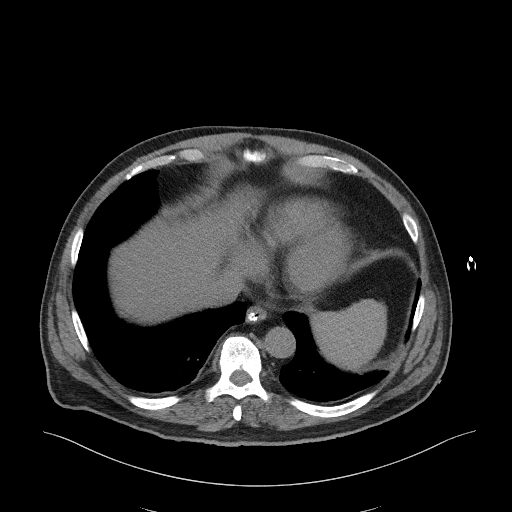
[im 84/101  lung]
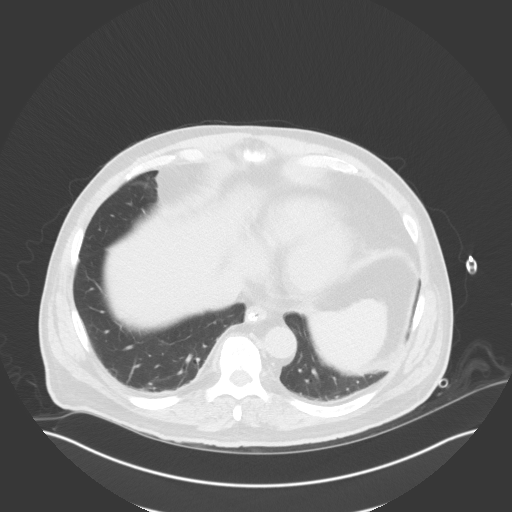

[Series 3: coronal pre · coronal · non-contrast · 0.59mm/px · 2 of 107 slices shown, 3 images]
[im 36/107  soft-tissue]
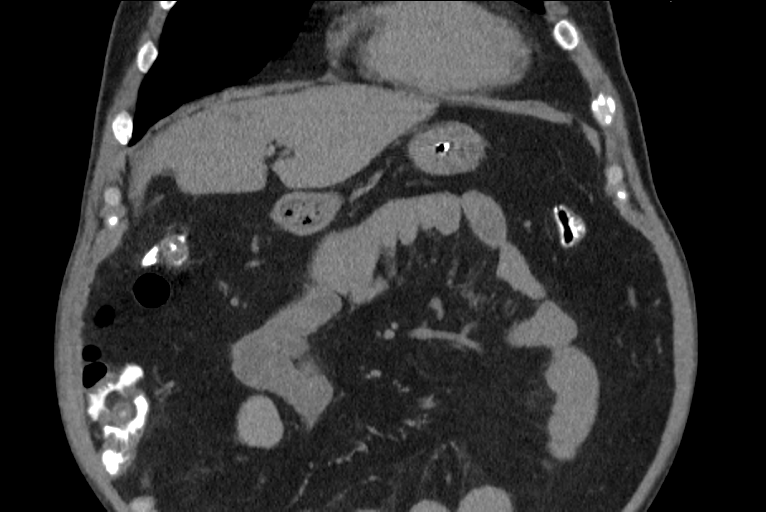
[im 36/107  bone]
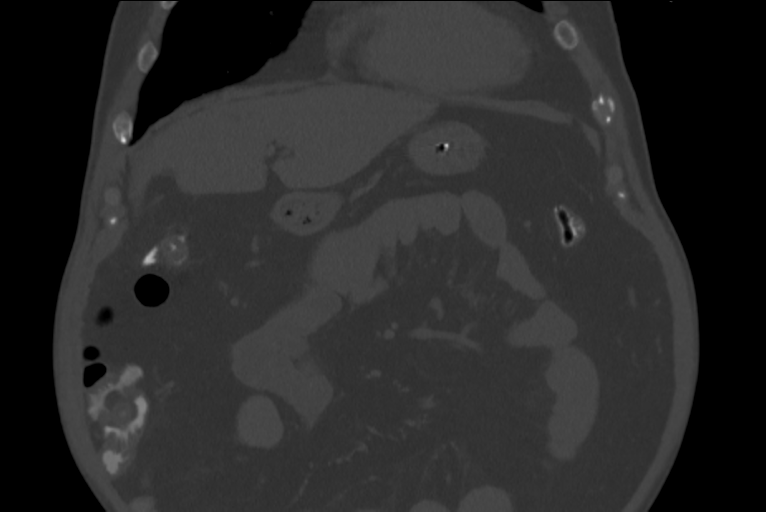
[im 71/107  soft-tissue]
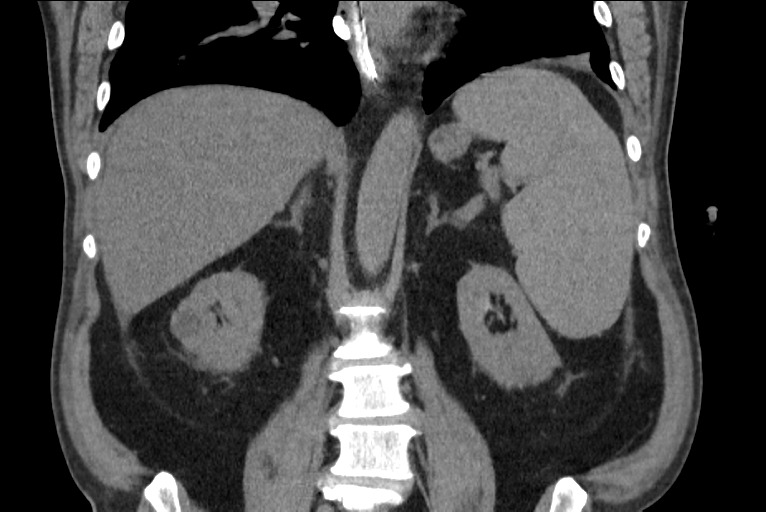

[Series 6: axial arterial · axial · arterial · 0.88mm/px · z∈[+1230,+1347]mm · 3 of 98 slices shown]
[im 20/98  soft-tissue]
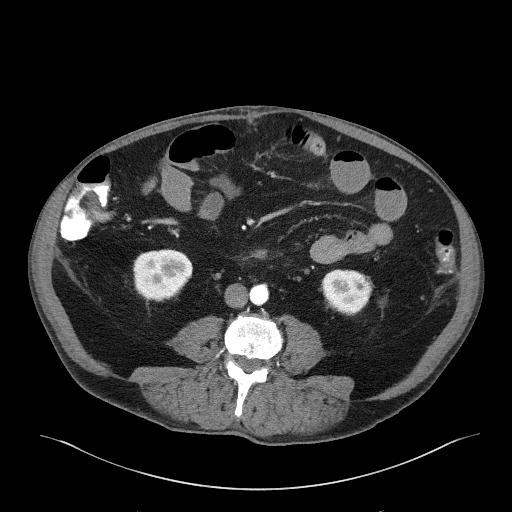
[im 39/98  soft-tissue]
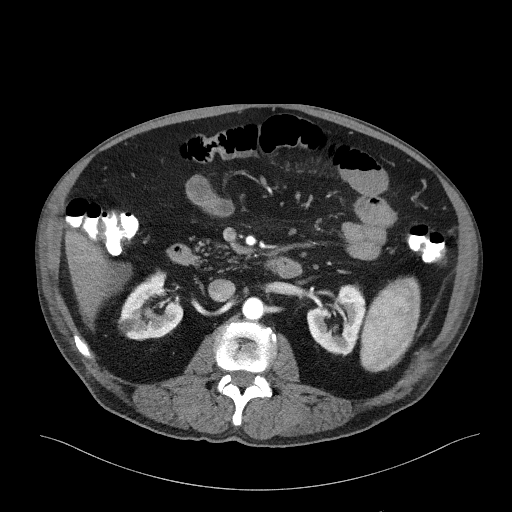
[im 59/98  soft-tissue]
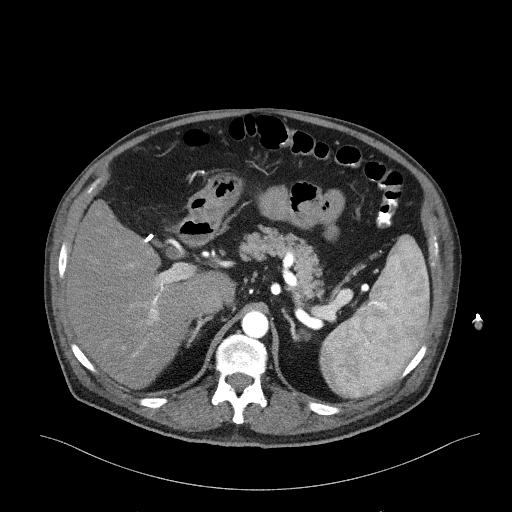

[10 of 46 positions shown; findings below may reference images not displayed]

FINDINGS: Lower chest: No significant pulmonary nodules or acute consolidative
airspace disease. Coarse calcified right parasagittal and right
hilar nodes from prior granulomatous disease.

Hepatobiliary: Diffuse hepatic steatosis. No definite liver surface
irregularity. Subcentimeter hypodense segment 4A left liver lobe
lesion is too small to characterize. No additional liver mass.
Cholecystectomy. Bile ducts are within normal post cholecystectomy
limits.

Pancreas: Normal, with no mass or duct dilation.

Spleen: Mild splenomegaly (craniocaudal splenic length 15.0 cm). No
splenic mass.

Adrenals/Urinary Tract: Normal adrenals. No renal stones. No
hydronephrosis. Avidly enhancing 2.0 x 1.8 cm renal cortical mass in
the interpolar left kidney (series 11/image 71). Minimally complex
1.7 cm renal cyst in the interpolar left kidney with focal mural
calcification (series 11/image 70), compatible with Bosniak category
2 renal cyst. Small simple bilateral renal cysts, largest 1.9 cm in
the upper right kidney. Additional subcentimeter hypodense renal
cortical lesions in both kidneys, too small to characterize.

Stomach/Bowel: Enteric tube terminates in the distal body of the
stomach. Stomach is collapsed and otherwise normal. Mildly dilated
small bowel loops with air-fluid levels throughout the visualized
abdomen measuring up to the 3.2 cm diameter, decreased from 3.7 cm.
No wall thickening in the visualized bowel. Moderate left colonic
diverticulosis. Oral contrast transits to the left colon.

Vascular/Lymphatic: Atherosclerotic nonaneurysmal abdominal aorta.
Patent portal, splenic, hepatic and renal veins. No pathologically
enlarged lymph nodes in the abdomen.

Other: No pneumoperitoneum. Trace perihepatic ascites. No focal
fluid collection.

Musculoskeletal: No aggressive appearing focal osseous lesions. Mild
thoracolumbar spondylosis.
IMPRESSION: 1. Avidly enhancing 2.0 cm renal cortical mass in the interpolar
left kidney compatible with clear cell renal cell carcinoma. Patent
renal veins with no tumor thrombus. No evidence of metastatic
disease in the abdomen.
2. Mildly dilated small bowel loops with air-fluid levels throughout
the visualized abdomen, mildly improved compared to the CT study
from 1 day prior. Oral contrast transits to the left colon. Findings
suggest improving mild small-bowel obstruction or ileus.
3. Mild splenomegaly.  Trace perihepatic ascites.
4. Diffuse hepatic steatosis. No overt morphologic findings of
hepatic cirrhosis.
5.  Aortic Atherosclerosis (CLUK8-BT9.9).

## 2019-07-10 DIAGNOSIS — D3131 Benign neoplasm of right choroid: Secondary | ICD-10-CM | POA: Diagnosis not present

## 2019-07-10 DIAGNOSIS — H0102B Squamous blepharitis left eye, upper and lower eyelids: Secondary | ICD-10-CM | POA: Diagnosis not present

## 2019-07-10 DIAGNOSIS — H401111 Primary open-angle glaucoma, right eye, mild stage: Secondary | ICD-10-CM | POA: Diagnosis not present

## 2019-07-10 DIAGNOSIS — H16223 Keratoconjunctivitis sicca, not specified as Sjogren's, bilateral: Secondary | ICD-10-CM | POA: Diagnosis not present

## 2019-07-10 DIAGNOSIS — H43393 Other vitreous opacities, bilateral: Secondary | ICD-10-CM | POA: Diagnosis not present

## 2019-07-10 DIAGNOSIS — H401123 Primary open-angle glaucoma, left eye, severe stage: Secondary | ICD-10-CM | POA: Diagnosis not present

## 2019-07-10 DIAGNOSIS — Z961 Presence of intraocular lens: Secondary | ICD-10-CM | POA: Diagnosis not present

## 2019-07-10 DIAGNOSIS — H0102A Squamous blepharitis right eye, upper and lower eyelids: Secondary | ICD-10-CM | POA: Diagnosis not present

## 2019-07-15 ENCOUNTER — Other Ambulatory Visit (HOSPITAL_COMMUNITY)
Admission: RE | Admit: 2019-07-15 | Discharge: 2019-07-15 | Disposition: A | Discharge status: Home / Self Care | Payer: Medicare PPO | Source: Ambulatory Visit | Attending: Internal Medicine | Admitting: Internal Medicine

## 2019-07-15 ENCOUNTER — Other Ambulatory Visit: Payer: Self-pay

## 2019-07-15 ENCOUNTER — Other Ambulatory Visit: Payer: Medicare PPO

## 2019-07-15 DIAGNOSIS — B20 Human immunodeficiency virus [HIV] disease: Secondary | ICD-10-CM | POA: Diagnosis not present

## 2019-07-15 DIAGNOSIS — Z21 Asymptomatic human immunodeficiency virus [HIV] infection status: Secondary | ICD-10-CM

## 2019-07-15 DIAGNOSIS — Z113 Encounter for screening for infections with a predominantly sexual mode of transmission: Secondary | ICD-10-CM | POA: Insufficient documentation

## 2019-07-16 LAB — T-HELPER CELL (CD4) - (RCID CLINIC ONLY)
CD4 % Helper T Cell: 67 % — ABNORMAL HIGH (ref 33–65)
CD4 T Cell Abs: 444 /uL (ref 400–1790)

## 2019-07-17 DIAGNOSIS — Z08 Encounter for follow-up examination after completed treatment for malignant neoplasm: Secondary | ICD-10-CM | POA: Diagnosis not present

## 2019-07-17 DIAGNOSIS — B9689 Other specified bacterial agents as the cause of diseases classified elsewhere: Secondary | ICD-10-CM | POA: Diagnosis not present

## 2019-07-17 DIAGNOSIS — D225 Melanocytic nevi of trunk: Secondary | ICD-10-CM | POA: Diagnosis not present

## 2019-07-17 DIAGNOSIS — X32XXXD Exposure to sunlight, subsequent encounter: Secondary | ICD-10-CM | POA: Diagnosis not present

## 2019-07-17 DIAGNOSIS — L570 Actinic keratosis: Secondary | ICD-10-CM | POA: Diagnosis not present

## 2019-07-17 DIAGNOSIS — Z1283 Encounter for screening for malignant neoplasm of skin: Secondary | ICD-10-CM | POA: Diagnosis not present

## 2019-07-17 DIAGNOSIS — L02229 Furuncle of trunk, unspecified: Secondary | ICD-10-CM | POA: Diagnosis not present

## 2019-07-17 DIAGNOSIS — Z85820 Personal history of malignant melanoma of skin: Secondary | ICD-10-CM | POA: Diagnosis not present

## 2019-07-17 LAB — CBC WITH DIFFERENTIAL/PLATELET
Absolute Monocytes: 248 cells/uL (ref 200–950)
Basophils Absolute: 9 cells/uL (ref 0–200)
Basophils Relative: 0.2 %
Eosinophils Absolute: 129 cells/uL (ref 15–500)
Eosinophils Relative: 2.8 %
HCT: 39.1 % (ref 38.5–50.0)
Hemoglobin: 13.7 g/dL (ref 13.2–17.1)
Lymphs Abs: 1136 cells/uL (ref 850–3900)
MCH: 31.4 pg (ref 27.0–33.0)
MCHC: 35 g/dL (ref 32.0–36.0)
MCV: 89.5 fL (ref 80.0–100.0)
MPV: 10.6 fL (ref 7.5–12.5)
Monocytes Relative: 5.4 %
Neutro Abs: 3077 cells/uL (ref 1500–7800)
Neutrophils Relative %: 66.9 %
Platelets: 122 10*3/uL — ABNORMAL LOW (ref 140–400)
RBC: 4.37 10*6/uL (ref 4.20–5.80)
RDW: 14.7 % (ref 11.0–15.0)
Total Lymphocyte: 24.7 %
WBC: 4.6 10*3/uL (ref 3.8–10.8)

## 2019-07-17 LAB — COMPLETE METABOLIC PANEL WITH GFR
AG Ratio: 1.8 (calc) (ref 1.0–2.5)
ALT: 39 U/L (ref 9–46)
AST: 34 U/L (ref 10–35)
Albumin: 4.2 g/dL (ref 3.6–5.1)
Alkaline phosphatase (APISO): 59 U/L (ref 35–144)
BUN: 14 mg/dL (ref 7–25)
CO2: 26 mmol/L (ref 20–32)
Calcium: 9.1 mg/dL (ref 8.6–10.3)
Chloride: 105 mmol/L (ref 98–110)
Creat: 1.1 mg/dL (ref 0.70–1.18)
GFR, Est African American: 78 mL/min/{1.73_m2} (ref >=60)
GFR, Est Non African American: 67 mL/min/{1.73_m2} (ref >=60)
Globulin: 2.3 g/dL (calc) (ref 1.9–3.7)
Glucose, Bld: 114 mg/dL — ABNORMAL HIGH (ref 65–99)
Potassium: 4.1 mmol/L (ref 3.5–5.3)
Sodium: 140 mmol/L (ref 135–146)
Total Bilirubin: 1.1 mg/dL (ref 0.2–1.2)
Total Protein: 6.5 g/dL (ref 6.1–8.1)

## 2019-07-17 LAB — HIV-1 RNA QUANT-NO REFLEX-BLD
HIV 1 RNA Quant: <20 copies/mL
HIV-1 RNA Quant, Log: <1.3 Log copies/mL

## 2019-07-17 LAB — RPR: RPR Ser Ql: NONREACTIVE

## 2019-07-18 LAB — URINE CYTOLOGY ANCILLARY ONLY
Chlamydia: NEGATIVE
Neisseria Gonorrhea: NEGATIVE

## 2019-07-29 ENCOUNTER — Encounter: Payer: Self-pay | Admitting: Internal Medicine

## 2019-07-29 ENCOUNTER — Other Ambulatory Visit: Payer: Self-pay

## 2019-07-29 ENCOUNTER — Ambulatory Visit (INDEPENDENT_AMBULATORY_CARE_PROVIDER_SITE_OTHER): Payer: Medicare PPO | Admitting: Internal Medicine

## 2019-07-29 DIAGNOSIS — Z23 Encounter for immunization: Secondary | ICD-10-CM

## 2019-07-29 DIAGNOSIS — Z21 Asymptomatic human immunodeficiency virus [HIV] infection status: Secondary | ICD-10-CM

## 2019-07-29 NOTE — Progress Notes (Signed)
Patient Active Problem List   Diagnosis Date Noted  . HIV (human immunodeficiency virus infection) ([place]) 06/12/2007    Priority: High  . Renal insufficiency 08/03/2016    Priority: Medium  . HYPERGLYCEMIA 04/09/2009    Priority: Medium  . HYPERLIPIDEMIA 12/31/2007    Priority: Medium  . Thrombocytopenia ([place]) 07/16/2018  . Normocytic anemia 07/16/2018  . Renal mass 07/04/2018  . CKD (chronic kidney disease), stage III 07/04/2018  . Elevated liver enzymes 07/04/2018  . Ileus ([place]) 07/03/2018  . Abnormal CT scan, kidney 07/03/2018  . Glaucoma 07/06/2017  . Peripheral neuropathy 01/05/2016  . Gout 07/07/2015  . Erectile dysfunction 05/20/2014  . Cholecystitis with cholelithiasis 04/24/2014  . GERD 06/12/2007  . BENIGN PROSTATIC HYPERTROPHY 06/12/2007  . PNEUMONIA, HX OF 06/12/2007  . 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   CARBOXYMETHYLCELLULOSE (REFRESH TEARS) 0.5 % SOLN    Place 1 drop into both eyes 3 (three) times daily as needed (dry eyes).   CLINDAMYCIN PHOSPHATE FOAM    Apply 1 application topically 2 (two) times daily as needed (dermatitis).    COMBIGAN 0.2-0.5 % OPHTHALMIC SOLUTION    Place 1 drop into both eyes 2 (two) times daily.    GENVOYA 150-150-200-10 MG TABS TABLET    TAKE 1 TABLET BY MOUTH ONCE DAILY WITH BREAKFAST   KETOCONAZOLE (NIZORAL) 2 % CREAM    Apply 1 application topically daily as needed for irritation.   LATANOPROST (XALATAN) 0.005 % OPHTHALMIC SOLUTION    Place 1 drop into both eyes at bedtime.    LOPERAMIDE (IMODIUM A-D) 2 MG TABLET    Take 2 mg by mouth every morning.    MULTIPLE VITAMIN (MULTIVITAMIN) TABLET    Take 1 tablet by mouth every morning.    MUPIROCIN OINTMENT (BACTROBAN) 2 %    Place 1 application into the nose daily as needed ("bump").   PANTOPRAZOLE  (PROTONIX) 40 MG TABLET    Take 40 mg by mouth daily.   PREGABALIN (LYRICA) 50 MG CAPSULE    Take 50 mg by mouth 2 (two) times daily.   RESTASIS 0.05 % OPHTHALMIC EMULSION    Place 1 drop into both eyes 2 (two) times daily.   SILDENAFIL (VIAGRA) 25 MG TABLET    Take 1-2 tablets by mouth as needed   TAMSULOSIN (FLOMAX) 0.4 MG CAPS CAPSULE    Take 0.4 mg by mouth daily.  Modified Medications   No medications on file  Discontinued Medications   No medications on file    Subjective: Brett Sims is in for his routine HIV follow-up visit.  Despite the Covid pandemic he has had no problems obtaining, taking or tolerating concerns Genvoya.  He recalls missing only 1 dose over the last year.  He and his husband, [name], have been socially distancing at home during the pandemic.  He is feeling well.  Review of Systems: Review of Systems  Constitutional: Negative for fever and weight loss.  HENT: Negative for congestion and sore throat.   Respiratory: Negative for cough and shortness of breath.   Cardiovascular: Negative for chest pain.  Gastrointestinal: Negative for abdominal pain, heartburn, nausea and vomiting.  Psychiatric/Behavioral: Negative for depression.    Past Medical History:  Diagnosis Date  . Cholecystolithiasis   .  Complication of anesthesia   . GERD (gastroesophageal reflux disease)   . HIV disease ([place])   . PONV (postoperative nausea and vomiting)     Social History   Tobacco Use  . Smoking status: Never Smoker  . Smokeless tobacco: Never Used  Substance Use Topics  . Alcohol use: Yes    Comment: occassionally  . Drug use: No    No family history on file.  Allergies  Allergen Reactions  . Codeine Other (See Comments)    Mood changes   . Erythromycin Nausea And Vomiting  . Sulfonamide Derivatives Nausea And Vomiting  . Sulfamethoxazole Rash    Health Maintenance  Topic Date Due  . TETANUS/TDAP  04/23/1967  . INFLUENZA VACCINE  05/11/2019  . COLONOSCOPY   08/19/2024  . Hepatitis C Screening  Completed  . PNA vac Low Risk Adult  Completed    Objective:  Vitals:   07/29/19 1353  BP: (!) 172/89  Pulse: 73  Temp: 98.1 F (36.7 C)  TempSrc: Oral  Weight: 197 lb (89.4 kg)   Body mass index is 28.27 kg/m.  Physical Exam Constitutional:      Comments: He is in his usual good spirits.  Cardiovascular:     Rate and Rhythm: Normal rate and regular rhythm.     Heart sounds: No murmur.  Pulmonary:     Effort: Pulmonary effort is normal.     Breath sounds: Normal breath sounds.  Psychiatric:        Mood and Affect: Mood normal.     Lab Results Lab Results  Component Value Date   WBC 4.6 07/15/2019   HGB 13.7 07/15/2019   HCT 39.1 07/15/2019   MCV 89.5 07/15/2019   PLT 122 (L) 07/15/2019    Lab Results  Component Value Date   CREATININE 1.10 07/15/2019   BUN 14 07/15/2019   NA 140 07/15/2019   K 4.1 07/15/2019   CL 105 07/15/2019   CO2 26 07/15/2019    Lab Results  Component Value Date   ALT 39 07/15/2019   AST 34 07/15/2019   ALKPHOS 63 07/16/2018   BILITOT 1.1 07/15/2019    Lab Results  Component Value Date   CHOL 106 06/20/2018   HDL 31 (L) 06/20/2018   LDLCALC 50 06/20/2018   TRIG 176 (H) 06/20/2018   CHOLHDL 3.4 06/20/2018   Lab Results  Component Value Date   LABRPR NON-REACTIVE 07/15/2019   HIV 1 RNA Quant (copies/mL)  Date Value  07/15/2019 <20 NOT DETECTED  06/20/2018 <20 NOT DETECTED  06/22/2017 <20 NOT DETECTED   CD4 T Cell Abs (/uL)  Date Value  07/15/2019 444  06/20/2018 330 (L)  06/22/2017 290 (L)     Problem List Items Addressed This Visit      High   HIV (human immunodeficiency virus infection) ([place])   Relevant Orders   CBC   T-helper cell (CD4)- ([facility] clinic only)   Comprehensive metabolic panel   Lipid panel   RPR   HIV-1 RNA quant-no reflex-bld        Michel Bickers, MD [facility] for [place] [phone] pager   [phone]  [phone] cell 07/29/2019, 2:12 PM

## 2019-07-29 NOTE — Assessment & Plan Note (Signed)
His infection remains under excellent, long-term control.  He received his influenza vaccine today.  He will follow-up after lab work in 1 year.

## 2019-10-28 ENCOUNTER — Other Ambulatory Visit: Payer: Self-pay | Admitting: Internal Medicine

## 2019-10-28 DIAGNOSIS — B20 Human immunodeficiency virus [HIV] disease: Secondary | ICD-10-CM

## 2019-10-29 DIAGNOSIS — Z1283 Encounter for screening for malignant neoplasm of skin: Secondary | ICD-10-CM | POA: Diagnosis not present

## 2019-10-29 DIAGNOSIS — Z85820 Personal history of malignant melanoma of skin: Secondary | ICD-10-CM | POA: Diagnosis not present

## 2019-10-29 DIAGNOSIS — Z08 Encounter for follow-up examination after completed treatment for malignant neoplasm: Secondary | ICD-10-CM | POA: Diagnosis not present

## 2019-11-11 DIAGNOSIS — H0102B Squamous blepharitis left eye, upper and lower eyelids: Secondary | ICD-10-CM | POA: Diagnosis not present

## 2019-11-11 DIAGNOSIS — H401123 Primary open-angle glaucoma, left eye, severe stage: Secondary | ICD-10-CM | POA: Diagnosis not present

## 2019-11-11 DIAGNOSIS — Z961 Presence of intraocular lens: Secondary | ICD-10-CM | POA: Diagnosis not present

## 2019-11-11 DIAGNOSIS — H16223 Keratoconjunctivitis sicca, not specified as Sjogren's, bilateral: Secondary | ICD-10-CM | POA: Diagnosis not present

## 2019-11-11 DIAGNOSIS — D3131 Benign neoplasm of right choroid: Secondary | ICD-10-CM | POA: Diagnosis not present

## 2019-11-11 DIAGNOSIS — H43393 Other vitreous opacities, bilateral: Secondary | ICD-10-CM | POA: Diagnosis not present

## 2019-11-11 DIAGNOSIS — H401111 Primary open-angle glaucoma, right eye, mild stage: Secondary | ICD-10-CM | POA: Diagnosis not present

## 2019-11-11 DIAGNOSIS — H0102A Squamous blepharitis right eye, upper and lower eyelids: Secondary | ICD-10-CM | POA: Diagnosis not present

## 2019-11-11 DIAGNOSIS — Z1159 Encounter for screening for other viral diseases: Secondary | ICD-10-CM | POA: Diagnosis not present

## 2019-11-14 DIAGNOSIS — D123 Benign neoplasm of transverse colon: Secondary | ICD-10-CM | POA: Diagnosis not present

## 2019-11-14 DIAGNOSIS — D122 Benign neoplasm of ascending colon: Secondary | ICD-10-CM | POA: Diagnosis not present

## 2019-11-14 DIAGNOSIS — Z86010 Personal history of colonic polyps: Secondary | ICD-10-CM | POA: Diagnosis not present

## 2019-11-19 DIAGNOSIS — D122 Benign neoplasm of ascending colon: Secondary | ICD-10-CM | POA: Diagnosis not present

## 2019-11-19 DIAGNOSIS — D123 Benign neoplasm of transverse colon: Secondary | ICD-10-CM | POA: Diagnosis not present

## 2019-12-24 DIAGNOSIS — C649 Malignant neoplasm of unspecified kidney, except renal pelvis: Secondary | ICD-10-CM | POA: Diagnosis not present

## 2019-12-24 DIAGNOSIS — D3002 Benign neoplasm of left kidney: Secondary | ICD-10-CM | POA: Diagnosis not present

## 2019-12-24 DIAGNOSIS — D4102 Neoplasm of uncertain behavior of left kidney: Secondary | ICD-10-CM | POA: Diagnosis not present

## 2019-12-26 DIAGNOSIS — N401 Enlarged prostate with lower urinary tract symptoms: Secondary | ICD-10-CM | POA: Diagnosis not present

## 2019-12-26 DIAGNOSIS — D3002 Benign neoplasm of left kidney: Secondary | ICD-10-CM | POA: Diagnosis not present

## 2019-12-26 DIAGNOSIS — R3912 Poor urinary stream: Secondary | ICD-10-CM | POA: Diagnosis not present

## 2020-01-21 DIAGNOSIS — D2271 Melanocytic nevi of right lower limb, including hip: Secondary | ICD-10-CM | POA: Diagnosis not present

## 2020-01-21 DIAGNOSIS — D485 Neoplasm of uncertain behavior of skin: Secondary | ICD-10-CM | POA: Diagnosis not present

## 2020-01-21 DIAGNOSIS — D225 Melanocytic nevi of trunk: Secondary | ICD-10-CM | POA: Diagnosis not present

## 2020-01-21 DIAGNOSIS — Z08 Encounter for follow-up examination after completed treatment for malignant neoplasm: Secondary | ICD-10-CM | POA: Diagnosis not present

## 2020-01-21 DIAGNOSIS — Z85820 Personal history of malignant melanoma of skin: Secondary | ICD-10-CM | POA: Diagnosis not present

## 2020-01-21 DIAGNOSIS — Z1283 Encounter for screening for malignant neoplasm of skin: Secondary | ICD-10-CM | POA: Diagnosis not present

## 2020-01-31 DIAGNOSIS — L988 Other specified disorders of the skin and subcutaneous tissue: Secondary | ICD-10-CM | POA: Diagnosis not present

## 2020-01-31 DIAGNOSIS — D485 Neoplasm of uncertain behavior of skin: Secondary | ICD-10-CM | POA: Diagnosis not present

## 2020-03-25 DIAGNOSIS — H43393 Other vitreous opacities, bilateral: Secondary | ICD-10-CM | POA: Diagnosis not present

## 2020-03-25 DIAGNOSIS — H401111 Primary open-angle glaucoma, right eye, mild stage: Secondary | ICD-10-CM | POA: Diagnosis not present

## 2020-03-25 DIAGNOSIS — H0102B Squamous blepharitis left eye, upper and lower eyelids: Secondary | ICD-10-CM | POA: Diagnosis not present

## 2020-03-25 DIAGNOSIS — H401123 Primary open-angle glaucoma, left eye, severe stage: Secondary | ICD-10-CM | POA: Diagnosis not present

## 2020-03-25 DIAGNOSIS — H16223 Keratoconjunctivitis sicca, not specified as Sjogren's, bilateral: Secondary | ICD-10-CM | POA: Diagnosis not present

## 2020-03-25 DIAGNOSIS — Z961 Presence of intraocular lens: Secondary | ICD-10-CM | POA: Diagnosis not present

## 2020-03-25 DIAGNOSIS — D3131 Benign neoplasm of right choroid: Secondary | ICD-10-CM | POA: Diagnosis not present

## 2020-03-25 DIAGNOSIS — H0102A Squamous blepharitis right eye, upper and lower eyelids: Secondary | ICD-10-CM | POA: Diagnosis not present

## 2020-04-09 DIAGNOSIS — Z0000 Encounter for general adult medical examination without abnormal findings: Secondary | ICD-10-CM | POA: Diagnosis not present

## 2020-04-09 DIAGNOSIS — I10 Essential (primary) hypertension: Secondary | ICD-10-CM | POA: Diagnosis not present

## 2020-04-09 DIAGNOSIS — R21 Rash and other nonspecific skin eruption: Secondary | ICD-10-CM | POA: Diagnosis not present

## 2020-04-09 DIAGNOSIS — G609 Hereditary and idiopathic neuropathy, unspecified: Secondary | ICD-10-CM | POA: Diagnosis not present

## 2020-04-09 DIAGNOSIS — N400 Enlarged prostate without lower urinary tract symptoms: Secondary | ICD-10-CM | POA: Diagnosis not present

## 2020-04-09 DIAGNOSIS — E785 Hyperlipidemia, unspecified: Secondary | ICD-10-CM | POA: Diagnosis not present

## 2020-04-09 DIAGNOSIS — Z1389 Encounter for screening for other disorder: Secondary | ICD-10-CM | POA: Diagnosis not present

## 2020-04-09 DIAGNOSIS — K219 Gastro-esophageal reflux disease without esophagitis: Secondary | ICD-10-CM | POA: Diagnosis not present

## 2020-04-09 DIAGNOSIS — M109 Gout, unspecified: Secondary | ICD-10-CM | POA: Diagnosis not present

## 2020-04-09 DIAGNOSIS — Z85820 Personal history of malignant melanoma of skin: Secondary | ICD-10-CM | POA: Diagnosis not present

## 2020-04-21 DIAGNOSIS — L738 Other specified follicular disorders: Secondary | ICD-10-CM | POA: Diagnosis not present

## 2020-04-21 DIAGNOSIS — L821 Other seborrheic keratosis: Secondary | ICD-10-CM | POA: Diagnosis not present

## 2020-04-21 DIAGNOSIS — Z85820 Personal history of malignant melanoma of skin: Secondary | ICD-10-CM | POA: Diagnosis not present

## 2020-04-24 DIAGNOSIS — L821 Other seborrheic keratosis: Secondary | ICD-10-CM | POA: Diagnosis not present

## 2020-04-24 DIAGNOSIS — Z85820 Personal history of malignant melanoma of skin: Secondary | ICD-10-CM | POA: Diagnosis not present

## 2020-04-24 DIAGNOSIS — Z1283 Encounter for screening for malignant neoplasm of skin: Secondary | ICD-10-CM | POA: Diagnosis not present

## 2020-04-24 DIAGNOSIS — Z08 Encounter for follow-up examination after completed treatment for malignant neoplasm: Secondary | ICD-10-CM | POA: Diagnosis not present

## 2020-05-19 ENCOUNTER — Other Ambulatory Visit: Payer: Self-pay | Admitting: Internal Medicine

## 2020-05-19 DIAGNOSIS — B20 Human immunodeficiency virus [HIV] disease: Secondary | ICD-10-CM

## 2020-07-23 DIAGNOSIS — Z85820 Personal history of malignant melanoma of skin: Secondary | ICD-10-CM | POA: Diagnosis not present

## 2020-07-23 DIAGNOSIS — L738 Other specified follicular disorders: Secondary | ICD-10-CM | POA: Diagnosis not present

## 2020-07-24 DIAGNOSIS — Z1283 Encounter for screening for malignant neoplasm of skin: Secondary | ICD-10-CM | POA: Diagnosis not present

## 2020-07-24 DIAGNOSIS — D485 Neoplasm of uncertain behavior of skin: Secondary | ICD-10-CM | POA: Diagnosis not present

## 2020-07-24 DIAGNOSIS — D225 Melanocytic nevi of trunk: Secondary | ICD-10-CM | POA: Diagnosis not present

## 2020-07-24 DIAGNOSIS — Z08 Encounter for follow-up examination after completed treatment for malignant neoplasm: Secondary | ICD-10-CM | POA: Diagnosis not present

## 2020-07-24 DIAGNOSIS — B0089 Other herpesviral infection: Secondary | ICD-10-CM | POA: Diagnosis not present

## 2020-07-24 DIAGNOSIS — D2271 Melanocytic nevi of right lower limb, including hip: Secondary | ICD-10-CM | POA: Diagnosis not present

## 2020-07-24 DIAGNOSIS — Z85820 Personal history of malignant melanoma of skin: Secondary | ICD-10-CM | POA: Diagnosis not present

## 2020-07-27 DIAGNOSIS — D3131 Benign neoplasm of right choroid: Secondary | ICD-10-CM | POA: Diagnosis not present

## 2020-07-27 DIAGNOSIS — H401111 Primary open-angle glaucoma, right eye, mild stage: Secondary | ICD-10-CM | POA: Diagnosis not present

## 2020-07-27 DIAGNOSIS — H43393 Other vitreous opacities, bilateral: Secondary | ICD-10-CM | POA: Diagnosis not present

## 2020-07-27 DIAGNOSIS — H16223 Keratoconjunctivitis sicca, not specified as Sjogren's, bilateral: Secondary | ICD-10-CM | POA: Diagnosis not present

## 2020-07-27 DIAGNOSIS — H0102A Squamous blepharitis right eye, upper and lower eyelids: Secondary | ICD-10-CM | POA: Diagnosis not present

## 2020-07-27 DIAGNOSIS — H0102B Squamous blepharitis left eye, upper and lower eyelids: Secondary | ICD-10-CM | POA: Diagnosis not present

## 2020-07-27 DIAGNOSIS — Z961 Presence of intraocular lens: Secondary | ICD-10-CM | POA: Diagnosis not present

## 2020-07-27 DIAGNOSIS — H401123 Primary open-angle glaucoma, left eye, severe stage: Secondary | ICD-10-CM | POA: Diagnosis not present

## 2020-07-28 ENCOUNTER — Other Ambulatory Visit: Payer: Medicare PPO

## 2020-07-28 ENCOUNTER — Other Ambulatory Visit: Payer: Self-pay

## 2020-07-28 DIAGNOSIS — Z21 Asymptomatic human immunodeficiency virus [HIV] infection status: Secondary | ICD-10-CM | POA: Diagnosis not present

## 2020-07-29 LAB — T-HELPER CELL (CD4) - (RCID CLINIC ONLY)
CD4 % Helper T Cell: 35 % (ref 33–65)
CD4 T Cell Abs: 323 /uL — ABNORMAL LOW (ref 400–1790)

## 2020-07-30 LAB — CBC
HCT: 36 % — ABNORMAL LOW (ref 38.5–50.0)
Hemoglobin: 12.4 g/dL — ABNORMAL LOW (ref 13.2–17.1)
MCH: 30.7 pg (ref 27.0–33.0)
MCHC: 34.4 g/dL (ref 32.0–36.0)
MCV: 89.1 fL (ref 80.0–100.0)
MPV: 10.3 fL (ref 7.5–12.5)
Platelets: 95 10*3/uL — ABNORMAL LOW (ref 140–400)
RBC: 4.04 10*6/uL — ABNORMAL LOW (ref 4.20–5.80)
RDW: 14.6 % (ref 11.0–15.0)
WBC: 4.3 10*3/uL (ref 3.8–10.8)

## 2020-07-30 LAB — COMPREHENSIVE METABOLIC PANEL
AG Ratio: 1.9 (calc) (ref 1.0–2.5)
ALT: 29 U/L (ref 9–46)
AST: 21 U/L (ref 10–35)
Albumin: 4.1 g/dL (ref 3.6–5.1)
Alkaline phosphatase (APISO): 67 U/L (ref 35–144)
BUN: 11 mg/dL (ref 7–25)
CO2: 28 mmol/L (ref 20–32)
Calcium: 9.1 mg/dL (ref 8.6–10.3)
Chloride: 102 mmol/L (ref 98–110)
Creat: 1.11 mg/dL (ref 0.70–1.18)
Globulin: 2.2 g/dL (calc) (ref 1.9–3.7)
Glucose, Bld: 164 mg/dL — ABNORMAL HIGH (ref 65–99)
Potassium: 4 mmol/L (ref 3.5–5.3)
Sodium: 137 mmol/L (ref 135–146)
Total Bilirubin: 1.2 mg/dL (ref 0.2–1.2)
Total Protein: 6.3 g/dL (ref 6.1–8.1)

## 2020-07-30 LAB — RPR: RPR Ser Ql: NONREACTIVE

## 2020-07-30 LAB — LIPID PANEL
Cholesterol: 101 mg/dL (ref <200)
HDL: 29 mg/dL — ABNORMAL LOW (ref >=40)
LDL Cholesterol (Calc): 39 mg/dL (calc)
Non-HDL Cholesterol (Calc): 72 mg/dL (calc) (ref <130)
Total CHOL/HDL Ratio: 3.5 (calc) (ref <5.0)
Triglycerides: 314 mg/dL — ABNORMAL HIGH (ref <150)

## 2020-07-30 LAB — HIV-1 RNA QUANT-NO REFLEX-BLD
HIV 1 RNA Quant: <20 Copies/mL
HIV-1 RNA Quant, Log: <1.3 Log cps/mL

## 2020-07-31 DIAGNOSIS — H6092 Unspecified otitis externa, left ear: Secondary | ICD-10-CM | POA: Diagnosis not present

## 2020-08-03 DIAGNOSIS — H9193 Unspecified hearing loss, bilateral: Secondary | ICD-10-CM | POA: Diagnosis not present

## 2020-08-03 DIAGNOSIS — H60332 Swimmer's ear, left ear: Secondary | ICD-10-CM | POA: Diagnosis not present

## 2020-08-07 DIAGNOSIS — L988 Other specified disorders of the skin and subcutaneous tissue: Secondary | ICD-10-CM | POA: Diagnosis not present

## 2020-08-07 DIAGNOSIS — D485 Neoplasm of uncertain behavior of skin: Secondary | ICD-10-CM | POA: Diagnosis not present

## 2020-08-18 ENCOUNTER — Ambulatory Visit (INDEPENDENT_AMBULATORY_CARE_PROVIDER_SITE_OTHER): Payer: Medicare PPO | Admitting: Internal Medicine

## 2020-08-18 ENCOUNTER — Encounter: Payer: Self-pay | Admitting: Internal Medicine

## 2020-08-18 ENCOUNTER — Other Ambulatory Visit: Payer: Self-pay

## 2020-08-18 DIAGNOSIS — B20 Human immunodeficiency virus [HIV] disease: Secondary | ICD-10-CM

## 2020-08-18 DIAGNOSIS — Z21 Asymptomatic human immunodeficiency virus [HIV] infection status: Secondary | ICD-10-CM

## 2020-08-18 MED ORDER — GENVOYA 150-150-200-10 MG PO TABS: 1.0000 | ORAL_TABLET | Freq: Every day | ORAL | 11 refills | Status: DC

## 2020-08-18 NOTE — Progress Notes (Signed)
Patient Active Problem List   Diagnosis Date Noted  . HIV (human immunodeficiency virus infection) ([place]) 06/12/2007    Priority: High  . Renal insufficiency 08/03/2016    Priority: Medium  . HYPERGLYCEMIA 04/09/2009    Priority: Medium  . HYPERLIPIDEMIA 12/31/2007    Priority: Medium  . Thrombocytopenia ([place]) 07/16/2018  . Normocytic anemia 07/16/2018  . Renal mass 07/04/2018  . CKD (chronic kidney disease), stage III ([place]) 07/04/2018  . Elevated liver enzymes 07/04/2018  . Ileus ([place]) 07/03/2018  . Abnormal CT scan, kidney 07/03/2018  . Glaucoma 07/06/2017  . Peripheral neuropathy 01/05/2016  . Gout 07/07/2015  . Erectile dysfunction 05/20/2014  . Cholecystitis with cholelithiasis 04/24/2014  . GERD 06/12/2007  . BENIGN PROSTATIC HYPERTROPHY 06/12/2007  . PNEUMONIA, HX OF 06/12/2007  . 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   CARBOXYMETHYLCELLULOSE (REFRESH TEARS) 0.5 % SOLN    Place 1 drop into both eyes 3 (three) times daily as needed (dry eyes).   CLINDAMYCIN PHOSPHATE FOAM    Apply 1 application topically 2 (two) times daily as needed (dermatitis).    COMBIGAN 0.2-0.5 % OPHTHALMIC SOLUTION    Place 1 drop into both eyes 2 (two) times daily.    DOXYCYCLINE (VIBRAMYCIN) 50 MG CAPSULE    Take 50 mg by mouth daily.   KETOCONAZOLE (NIZORAL) 2 % CREAM    Apply 1 application topically daily as needed for irritation.   LATANOPROST (XALATAN) 0.005 % OPHTHALMIC SOLUTION    Place 1 drop into both eyes at bedtime.    LOPERAMIDE (IMODIUM A-D) 2 MG TABLET    Take 2 mg by mouth every morning.    MULTIPLE VITAMIN (MULTIVITAMIN) TABLET    Take 1 tablet by mouth every morning.    MUPIROCIN OINTMENT (BACTROBAN) 2 %    Place 1 application into the nose daily as needed ("bump").   PANTOPRAZOLE (PROTONIX) 40  MG TABLET    Take 40 mg by mouth daily.   PREGABALIN (LYRICA) 50 MG CAPSULE    Take 50 mg by mouth 2 (two) times daily.   RESTASIS 0.05 % OPHTHALMIC EMULSION    Place 1 drop into both eyes 2 (two) times daily.   SILDENAFIL (VIAGRA) 25 MG TABLET    Take 1-2 tablets by mouth as needed   TAMSULOSIN (FLOMAX) 0.4 MG CAPS CAPSULE    Take 0.4 mg by mouth daily.  Modified Medications   Modified Medication Previous Medication   ELVITEGRAVIR-COBICISTAT-EMTRICITABINE-TENOFOVIR (GENVOYA) 150-150-200-10 MG TABS TABLET GENVOYA 150-150-200-10 MG TABS tablet      Take 1 tablet by mouth daily with breakfast.    TAKE 1 TABLET BY MOUTH ONCE DAILY WITH BREAKFAST  Discontinued Medications   No medications on file    Subjective: Brett Sims is in for his routine HIV follow-up visit.  He has not had any problems obtaining, taking or tolerating his Genvoya and never misses a single dose.  He remains very active.  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  He is going to the gym 3 times weekly.  He has received his Covid vaccines including a booster dose recently.  Review of Systems: Review of Systems  Constitutional: Negative for fever and weight loss.  Respiratory: Negative for cough and shortness of breath.  Cardiovascular: Negative for chest pain.  Gastrointestinal: Negative for heartburn.  Psychiatric/Behavioral: Negative for depression.    Past Medical History:  Diagnosis Date  . Cholecystolithiasis   . Complication of anesthesia   . GERD (gastroesophageal reflux disease)   . HIV disease ([place])   . PONV (postoperative nausea and vomiting)     Social History   Tobacco Use  . Smoking status: Never Smoker  . Smokeless tobacco: Never Used  Vaping Use  . Vaping Use: Never used  Substance Use Topics  . Alcohol use: Yes    Comment: occassionally  . Drug use: No    No family history on file.  Allergies  Allergen Reactions  . Codeine Other (See Comments)    Mood changes   .  Erythromycin Nausea And Vomiting  . Sulfonamide Derivatives Nausea And Vomiting  . Sulfamethoxazole Rash    Health Maintenance  Topic Date Due  . COVID-19 Vaccine (1) Never done  . TETANUS/TDAP  Never done  . INFLUENZA VACCINE  05/10/2020  . COLONOSCOPY  11/13/2029  . Hepatitis C Screening  Completed  . PNA vac Low Risk Adult  Completed    Objective:  Vitals:   08/18/20 1359  BP: 117/75  Pulse: 90  Temp: 98 F (36.7 C)  Weight: 198 lb (89.8 kg)  Height: 5\' 10"  (1.778 m)   Body mass index is 28.41 kg/m.  Physical Exam Constitutional:      Comments: He is in good spirits as usual.  Cardiovascular:     Rate and Rhythm: Normal rate and regular rhythm.     Heart sounds: No murmur heard.   Pulmonary:     Effort: Pulmonary effort is normal.     Breath sounds: Normal breath sounds.  Abdominal:     Palpations: Abdomen is soft.     Tenderness: There is no abdominal tenderness.  Psychiatric:        Mood and Affect: Mood normal.     Lab Results Lab Results  Component Value Date   WBC 4.3 07/28/2020   HGB 12.4 (L) 07/28/2020   HCT 36.0 (L) 07/28/2020   MCV 89.1 07/28/2020   PLT 95 (L) 07/28/2020    Lab Results  Component Value Date   CREATININE 1.11 07/28/2020   BUN 11 07/28/2020   NA 137 07/28/2020   K 4.0 07/28/2020   CL 102 07/28/2020   CO2 28 07/28/2020    Lab Results  Component Value Date   ALT 29 07/28/2020   AST 21 07/28/2020   ALKPHOS 63 07/16/2018   BILITOT 1.2 07/28/2020    Lab Results  Component Value Date   CHOL 101 07/28/2020   HDL 29 (L) 07/28/2020   LDLCALC 39 07/28/2020   TRIG 314 (H) 07/28/2020   CHOLHDL 3.5 07/28/2020   Lab Results  Component Value Date   LABRPR NON-REACTIVE 07/28/2020   HIV 1 RNA Quant  Date Value  07/28/2020 <20 Copies/mL  07/15/2019 <20 NOT DETECTED copies/mL  06/20/2018 <20 NOT DETECTED copies/mL   CD4 T Cell Abs (/uL)  Date Value  07/28/2020 323 (L)  07/15/2019 444  06/20/2018 330 (L)      Problem List Items Addressed This Visit      High   HIV (human immunodeficiency virus infection) ([place])    His infection remains under excellent, long-term control.  He will continue Genvoya and follow-up after lab work in 1 year.      Relevant Medications   elvitegravir-cobicistat-emtricitabine-tenofovir (GENVOYA) 150-150-200-10 MG TABS tablet  Other Relevant Orders   CBC   T-helper cell (CD4)- ([facility] clinic only)   Comprehensive metabolic panel   Lipid panel   RPR   HIV-1 RNA quant-no reflex-bld    Other Visit Diagnoses    Human immunodeficiency virus (HIV) disease ([place])       Relevant Medications   elvitegravir-cobicistat-emtricitabine-tenofovir (GENVOYA) 150-150-200-10 MG TABS tablet        Michel Bickers, MD [facility] for [place] [phone] pager   [phone] cell 08/18/2020, 2:23 PM

## 2020-08-18 NOTE — Assessment & Plan Note (Signed)
His infection remains under excellent, long-term control.  He will continue Genvoya and follow-up after lab work in 1 year. 

## 2020-09-30 DIAGNOSIS — H903 Sensorineural hearing loss, bilateral: Secondary | ICD-10-CM | POA: Diagnosis not present

## 2020-10-23 DIAGNOSIS — L570 Actinic keratosis: Secondary | ICD-10-CM | POA: Diagnosis not present

## 2020-10-23 DIAGNOSIS — Z85820 Personal history of malignant melanoma of skin: Secondary | ICD-10-CM | POA: Diagnosis not present

## 2020-10-23 DIAGNOSIS — L218 Other seborrheic dermatitis: Secondary | ICD-10-CM | POA: Diagnosis not present

## 2020-10-23 DIAGNOSIS — D2261 Melanocytic nevi of right upper limb, including shoulder: Secondary | ICD-10-CM | POA: Diagnosis not present

## 2020-10-23 DIAGNOSIS — D2262 Melanocytic nevi of left upper limb, including shoulder: Secondary | ICD-10-CM | POA: Diagnosis not present

## 2020-10-23 DIAGNOSIS — L814 Other melanin hyperpigmentation: Secondary | ICD-10-CM | POA: Diagnosis not present

## 2020-10-23 DIAGNOSIS — L700 Acne vulgaris: Secondary | ICD-10-CM | POA: Diagnosis not present

## 2020-10-23 DIAGNOSIS — D225 Melanocytic nevi of trunk: Secondary | ICD-10-CM | POA: Diagnosis not present

## 2020-10-23 DIAGNOSIS — L821 Other seborrheic keratosis: Secondary | ICD-10-CM | POA: Diagnosis not present

## 2020-11-23 DIAGNOSIS — L700 Acne vulgaris: Secondary | ICD-10-CM | POA: Diagnosis not present

## 2020-11-23 DIAGNOSIS — Z85820 Personal history of malignant melanoma of skin: Secondary | ICD-10-CM | POA: Diagnosis not present

## 2020-11-24 DIAGNOSIS — H0102A Squamous blepharitis right eye, upper and lower eyelids: Secondary | ICD-10-CM | POA: Diagnosis not present

## 2020-11-24 DIAGNOSIS — H401111 Primary open-angle glaucoma, right eye, mild stage: Secondary | ICD-10-CM | POA: Diagnosis not present

## 2020-11-24 DIAGNOSIS — H43393 Other vitreous opacities, bilateral: Secondary | ICD-10-CM | POA: Diagnosis not present

## 2020-11-24 DIAGNOSIS — H0102B Squamous blepharitis left eye, upper and lower eyelids: Secondary | ICD-10-CM | POA: Diagnosis not present

## 2020-11-24 DIAGNOSIS — Z961 Presence of intraocular lens: Secondary | ICD-10-CM | POA: Diagnosis not present

## 2020-11-24 DIAGNOSIS — H401123 Primary open-angle glaucoma, left eye, severe stage: Secondary | ICD-10-CM | POA: Diagnosis not present

## 2020-11-24 DIAGNOSIS — H16223 Keratoconjunctivitis sicca, not specified as Sjogren's, bilateral: Secondary | ICD-10-CM | POA: Diagnosis not present

## 2020-11-24 DIAGNOSIS — D3131 Benign neoplasm of right choroid: Secondary | ICD-10-CM | POA: Diagnosis not present

## 2021-01-12 DIAGNOSIS — C44722 Squamous cell carcinoma of skin of right lower limb, including hip: Secondary | ICD-10-CM | POA: Diagnosis not present

## 2021-01-12 DIAGNOSIS — Z85828 Personal history of other malignant neoplasm of skin: Secondary | ICD-10-CM | POA: Diagnosis not present

## 2021-02-03 DIAGNOSIS — N2889 Other specified disorders of kidney and ureter: Secondary | ICD-10-CM | POA: Diagnosis not present

## 2021-02-03 DIAGNOSIS — R161 Splenomegaly, not elsewhere classified: Secondary | ICD-10-CM | POA: Diagnosis not present

## 2021-02-03 DIAGNOSIS — D3002 Benign neoplasm of left kidney: Secondary | ICD-10-CM | POA: Diagnosis not present

## 2021-02-03 DIAGNOSIS — D7389 Other diseases of spleen: Secondary | ICD-10-CM | POA: Diagnosis not present

## 2021-02-03 DIAGNOSIS — I700 Atherosclerosis of aorta: Secondary | ICD-10-CM | POA: Diagnosis not present

## 2021-02-15 DIAGNOSIS — D3002 Benign neoplasm of left kidney: Secondary | ICD-10-CM | POA: Diagnosis not present

## 2021-03-15 DIAGNOSIS — D3131 Benign neoplasm of right choroid: Secondary | ICD-10-CM | POA: Diagnosis not present

## 2021-03-15 DIAGNOSIS — H0102B Squamous blepharitis left eye, upper and lower eyelids: Secondary | ICD-10-CM | POA: Diagnosis not present

## 2021-03-15 DIAGNOSIS — Z961 Presence of intraocular lens: Secondary | ICD-10-CM | POA: Diagnosis not present

## 2021-03-15 DIAGNOSIS — H0102A Squamous blepharitis right eye, upper and lower eyelids: Secondary | ICD-10-CM | POA: Diagnosis not present

## 2021-03-15 DIAGNOSIS — H401111 Primary open-angle glaucoma, right eye, mild stage: Secondary | ICD-10-CM | POA: Diagnosis not present

## 2021-03-15 DIAGNOSIS — H401123 Primary open-angle glaucoma, left eye, severe stage: Secondary | ICD-10-CM | POA: Diagnosis not present

## 2021-03-15 DIAGNOSIS — H16223 Keratoconjunctivitis sicca, not specified as Sjogren's, bilateral: Secondary | ICD-10-CM | POA: Diagnosis not present

## 2021-03-15 DIAGNOSIS — H43393 Other vitreous opacities, bilateral: Secondary | ICD-10-CM | POA: Diagnosis not present

## 2021-04-26 DIAGNOSIS — L700 Acne vulgaris: Secondary | ICD-10-CM | POA: Diagnosis not present

## 2021-04-26 DIAGNOSIS — L570 Actinic keratosis: Secondary | ICD-10-CM | POA: Diagnosis not present

## 2021-04-26 DIAGNOSIS — D0461 Carcinoma in situ of skin of right upper limb, including shoulder: Secondary | ICD-10-CM | POA: Diagnosis not present

## 2021-04-26 DIAGNOSIS — L578 Other skin changes due to chronic exposure to nonionizing radiation: Secondary | ICD-10-CM | POA: Diagnosis not present

## 2021-04-26 DIAGNOSIS — Z85828 Personal history of other malignant neoplasm of skin: Secondary | ICD-10-CM | POA: Diagnosis not present

## 2021-04-26 DIAGNOSIS — L821 Other seborrheic keratosis: Secondary | ICD-10-CM | POA: Diagnosis not present

## 2021-04-26 DIAGNOSIS — D485 Neoplasm of uncertain behavior of skin: Secondary | ICD-10-CM | POA: Diagnosis not present

## 2021-05-24 DIAGNOSIS — E538 Deficiency of other specified B group vitamins: Secondary | ICD-10-CM | POA: Diagnosis not present

## 2021-05-24 DIAGNOSIS — Z21 Asymptomatic human immunodeficiency virus [HIV] infection status: Secondary | ICD-10-CM | POA: Diagnosis not present

## 2021-05-24 DIAGNOSIS — Z85820 Personal history of malignant melanoma of skin: Secondary | ICD-10-CM | POA: Diagnosis not present

## 2021-05-24 DIAGNOSIS — E1142 Type 2 diabetes mellitus with diabetic polyneuropathy: Secondary | ICD-10-CM | POA: Diagnosis not present

## 2021-05-24 DIAGNOSIS — Z1389 Encounter for screening for other disorder: Secondary | ICD-10-CM | POA: Diagnosis not present

## 2021-05-24 DIAGNOSIS — D510 Vitamin B12 deficiency anemia due to intrinsic factor deficiency: Secondary | ICD-10-CM | POA: Diagnosis not present

## 2021-05-24 DIAGNOSIS — K219 Gastro-esophageal reflux disease without esophagitis: Secondary | ICD-10-CM | POA: Diagnosis not present

## 2021-05-24 DIAGNOSIS — Z0000 Encounter for general adult medical examination without abnormal findings: Secondary | ICD-10-CM | POA: Diagnosis not present

## 2021-05-24 DIAGNOSIS — N400 Enlarged prostate without lower urinary tract symptoms: Secondary | ICD-10-CM | POA: Diagnosis not present

## 2021-05-24 DIAGNOSIS — M109 Gout, unspecified: Secondary | ICD-10-CM | POA: Diagnosis not present

## 2021-06-22 DIAGNOSIS — L738 Other specified follicular disorders: Secondary | ICD-10-CM | POA: Diagnosis not present

## 2021-06-22 DIAGNOSIS — L570 Actinic keratosis: Secondary | ICD-10-CM | POA: Diagnosis not present

## 2021-06-22 DIAGNOSIS — Z85828 Personal history of other malignant neoplasm of skin: Secondary | ICD-10-CM | POA: Diagnosis not present

## 2021-07-13 DIAGNOSIS — H401111 Primary open-angle glaucoma, right eye, mild stage: Secondary | ICD-10-CM | POA: Diagnosis not present

## 2021-07-13 DIAGNOSIS — H0102A Squamous blepharitis right eye, upper and lower eyelids: Secondary | ICD-10-CM | POA: Diagnosis not present

## 2021-07-13 DIAGNOSIS — H16223 Keratoconjunctivitis sicca, not specified as Sjogren's, bilateral: Secondary | ICD-10-CM | POA: Diagnosis not present

## 2021-07-13 DIAGNOSIS — D3131 Benign neoplasm of right choroid: Secondary | ICD-10-CM | POA: Diagnosis not present

## 2021-07-13 DIAGNOSIS — H401123 Primary open-angle glaucoma, left eye, severe stage: Secondary | ICD-10-CM | POA: Diagnosis not present

## 2021-07-13 DIAGNOSIS — H43393 Other vitreous opacities, bilateral: Secondary | ICD-10-CM | POA: Diagnosis not present

## 2021-07-13 DIAGNOSIS — H0102B Squamous blepharitis left eye, upper and lower eyelids: Secondary | ICD-10-CM | POA: Diagnosis not present

## 2021-07-13 DIAGNOSIS — Z961 Presence of intraocular lens: Secondary | ICD-10-CM | POA: Diagnosis not present

## 2021-07-30 DIAGNOSIS — D3131 Benign neoplasm of right choroid: Secondary | ICD-10-CM | POA: Diagnosis not present

## 2021-07-30 DIAGNOSIS — H0102B Squamous blepharitis left eye, upper and lower eyelids: Secondary | ICD-10-CM | POA: Diagnosis not present

## 2021-07-30 DIAGNOSIS — Z961 Presence of intraocular lens: Secondary | ICD-10-CM | POA: Diagnosis not present

## 2021-07-30 DIAGNOSIS — H43393 Other vitreous opacities, bilateral: Secondary | ICD-10-CM | POA: Diagnosis not present

## 2021-07-30 DIAGNOSIS — H401123 Primary open-angle glaucoma, left eye, severe stage: Secondary | ICD-10-CM | POA: Diagnosis not present

## 2021-07-30 DIAGNOSIS — H0102A Squamous blepharitis right eye, upper and lower eyelids: Secondary | ICD-10-CM | POA: Diagnosis not present

## 2021-07-30 DIAGNOSIS — H401111 Primary open-angle glaucoma, right eye, mild stage: Secondary | ICD-10-CM | POA: Diagnosis not present

## 2021-07-30 DIAGNOSIS — H16223 Keratoconjunctivitis sicca, not specified as Sjogren's, bilateral: Secondary | ICD-10-CM | POA: Diagnosis not present

## 2021-08-02 ENCOUNTER — Other Ambulatory Visit: Payer: Self-pay | Admitting: Internal Medicine

## 2021-08-02 DIAGNOSIS — B20 Human immunodeficiency virus [HIV] disease: Secondary | ICD-10-CM

## 2021-08-18 ENCOUNTER — Other Ambulatory Visit: Payer: Medicare PPO

## 2021-08-18 ENCOUNTER — Other Ambulatory Visit: Payer: Self-pay

## 2021-08-18 DIAGNOSIS — B20 Human immunodeficiency virus [HIV] disease: Secondary | ICD-10-CM | POA: Diagnosis not present

## 2021-08-18 DIAGNOSIS — Z21 Asymptomatic human immunodeficiency virus [HIV] infection status: Secondary | ICD-10-CM

## 2021-08-18 DIAGNOSIS — Z113 Encounter for screening for infections with a predominantly sexual mode of transmission: Secondary | ICD-10-CM | POA: Diagnosis not present

## 2021-08-18 DIAGNOSIS — Z79899 Other long term (current) drug therapy: Secondary | ICD-10-CM | POA: Diagnosis not present

## 2021-08-19 LAB — T-HELPER CELL (CD4) - (RCID CLINIC ONLY)
CD4 % Helper T Cell: 33 % (ref 33–65)
CD4 T Cell Abs: 283 /uL — ABNORMAL LOW (ref 400–1790)

## 2021-08-21 LAB — CBC
HCT: 38.3 % — ABNORMAL LOW (ref 38.5–50.0)
Hemoglobin: 13.1 g/dL — ABNORMAL LOW (ref 13.2–17.1)
MCH: 32 pg (ref 27.0–33.0)
MCHC: 34.2 g/dL (ref 32.0–36.0)
MCV: 93.4 fL (ref 80.0–100.0)
MPV: 10.5 fL (ref 7.5–12.5)
Platelets: 78 10*3/uL — ABNORMAL LOW (ref 140–400)
RBC: 4.1 10*6/uL — ABNORMAL LOW (ref 4.20–5.80)
RDW: 14.1 % (ref 11.0–15.0)
WBC: 3.2 10*3/uL — ABNORMAL LOW (ref 3.8–10.8)

## 2021-08-21 LAB — COMPREHENSIVE METABOLIC PANEL
AG Ratio: 2 (calc) (ref 1.0–2.5)
ALT: 40 U/L (ref 9–46)
AST: 27 U/L (ref 10–35)
Albumin: 4.2 g/dL (ref 3.6–5.1)
Alkaline phosphatase (APISO): 64 U/L (ref 35–144)
BUN: 12 mg/dL (ref 7–25)
CO2: 25 mmol/L (ref 20–32)
Calcium: 9 mg/dL (ref 8.6–10.3)
Chloride: 105 mmol/L (ref 98–110)
Creat: 1.01 mg/dL (ref 0.70–1.28)
Globulin: 2.1 g/dL (calc) (ref 1.9–3.7)
Glucose, Bld: 169 mg/dL — ABNORMAL HIGH (ref 65–99)
Potassium: 4.4 mmol/L (ref 3.5–5.3)
Sodium: 141 mmol/L (ref 135–146)
Total Bilirubin: 0.9 mg/dL (ref 0.2–1.2)
Total Protein: 6.3 g/dL (ref 6.1–8.1)

## 2021-08-21 LAB — HIV-1 RNA QUANT-NO REFLEX-BLD
HIV 1 RNA Quant: NOT DETECTED Copies/mL
HIV-1 RNA Quant, Log: NOT DETECTED Log cps/mL

## 2021-08-21 LAB — LIPID PANEL
Cholesterol: 117 mg/dL (ref <200)
HDL: 36 mg/dL — ABNORMAL LOW (ref >=40)
LDL Cholesterol (Calc): 56 mg/dL (calc)
Non-HDL Cholesterol (Calc): 81 mg/dL (calc) (ref <130)
Total CHOL/HDL Ratio: 3.3 (calc) (ref <5.0)
Triglycerides: 186 mg/dL — ABNORMAL HIGH (ref <150)

## 2021-08-21 LAB — RPR: RPR Ser Ql: NONREACTIVE

## 2021-09-07 ENCOUNTER — Other Ambulatory Visit: Payer: Self-pay

## 2021-09-07 ENCOUNTER — Ambulatory Visit: Payer: Medicare PPO | Admitting: Internal Medicine

## 2021-09-07 VITALS — BP 132/74 | HR 82 | Temp 98.0°F | Resp 16 | Ht 70.0 in | Wt 197.0 lb

## 2021-09-07 DIAGNOSIS — D3002 Benign neoplasm of left kidney: Secondary | ICD-10-CM | POA: Diagnosis not present

## 2021-09-07 DIAGNOSIS — Z21 Asymptomatic human immunodeficiency virus [HIV] infection status: Secondary | ICD-10-CM

## 2021-09-07 DIAGNOSIS — D61818 Other pancytopenia: Secondary | ICD-10-CM

## 2021-09-07 DIAGNOSIS — B20 Human immunodeficiency virus [HIV] disease: Secondary | ICD-10-CM

## 2021-09-07 MED ORDER — GENVOYA 150-150-200-10 MG PO TABS: ORAL_TABLET | ORAL | 11 refills | Status: DC

## 2021-09-07 NOTE — Progress Notes (Signed)
Patient Active Problem List   Diagnosis Date Noted   HIV (human immunodeficiency virus infection) ([place]) 06/12/2007    Priority: High   HYPERGLYCEMIA 04/09/2009    Priority: Medium    HYPERLIPIDEMIA 12/31/2007    Priority: Medium    Pancytopenia ([place]) 07/16/2018   Renal oncocytoma, left 07/04/2018   CKD (chronic kidney disease), stage III ([place]) 07/04/2018   Elevated liver enzymes 07/04/2018   Glaucoma 07/06/2017   Peripheral neuropathy 01/05/2016   Gout 07/07/2015   Erectile dysfunction 05/20/2014   Cholecystitis with cholelithiasis 04/24/2014   GERD 06/12/2007   BENIGN PROSTATIC HYPERTROPHY 06/12/2007   PNEUMONIA, HX OF 06/12/2007   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   CARBOXYMETHYLCELLULOSE (REFRESH PLUS) 0.5 % SOLN    Place 1 drop into both eyes 3 (three) times daily as needed (dry eyes).   CLARAVIS 20 MG CAPSULE    Take 20 mg by mouth daily.   CLINDAMYCIN PHOSPHATE FOAM    Apply 1 application topically 2 (two) times daily as needed (dermatitis).    COMBIGAN 0.2-0.5 % OPHTHALMIC SOLUTION    Place 1 drop into both eyes 2 (two) times daily.    DOXYCYCLINE (VIBRAMYCIN) 50 MG CAPSULE    Take 50 mg by mouth daily.   KETOCONAZOLE (NIZORAL) 2 % CREAM    Apply 1 application topically daily as needed for irritation.   LATANOPROST (XALATAN) 0.005 % OPHTHALMIC SOLUTION    Place 1 drop into both eyes at bedtime.    LOPERAMIDE (IMODIUM A-D) 2 MG TABLET    Take 2 mg by mouth every morning.    MULTIPLE VITAMIN (MULTIVITAMIN) TABLET    Take 1 tablet by mouth every morning.    MUPIROCIN OINTMENT (BACTROBAN) 2 %    Place 1 application into the nose daily as needed ("bump").   PANTOPRAZOLE (PROTONIX) 40 MG TABLET    Take 40 mg by mouth daily.   PREGABALIN (LYRICA) 50 MG CAPSULE    Take 50 mg by mouth 2 (two)  times daily.   RESTASIS 0.05 % OPHTHALMIC EMULSION    Place 1 drop into both eyes 2 (two) times daily.   SILDENAFIL (VIAGRA) 25 MG TABLET    Take 1-2 tablets by mouth as needed   TAMSULOSIN (FLOMAX) 0.4 MG CAPS CAPSULE    Take 0.4 mg by mouth daily.  Modified Medications   Modified Medication Previous Medication   ELVITEGRAVIR-COBICISTAT-EMTRICITABINE-TENOFOVIR (GENVOYA) 150-150-200-10 MG TABS TABLET GENVOYA 150-150-200-10 MG TABS tablet      TAKE ONE TABLET BY MOUTH ONCE DAILY WITH FOOD. STORE IN ORIGINAL CONTAINER AT ROOM TEMPERATURE.    TAKE ONE TABLET BY MOUTH ONCE DAILY WITH FOOD. STORE IN ORIGINAL CONTAINER AT ROOM TEMPERATURE.  Discontinued Medications   No medications on file    Subjective: Rush Landmark is in for his routine HIV follow-up visit.  He denies any problems obtaining, taking or tolerating his Genvoya and has not been missing doses.  He is feeling well.  He stays busy working in his garden.  He recently started on a new medication 3 times each week for adult acne and has had some benefit.  He has had a recent flu vaccine and COVID booster vaccine.  He wants to know if he should take the Shingrix vaccine  Review of Systems: Review of Systems  Constitutional:  Negative for fever and weight loss.  Respiratory:  Negative for cough and shortness of breath.   Cardiovascular:  Negative for chest pain.  Gastrointestinal:  Positive for heartburn. Negative for abdominal pain, blood in stool, diarrhea, nausea and vomiting.  Skin:  Positive for rash. Negative for itching.  Psychiatric/Behavioral:  Negative for depression.    Past Medical History:  Diagnosis Date   Cholecystolithiasis    Complication of anesthesia    GERD (gastroesophageal reflux disease)    HIV disease (H[facility])    PONV (postoperative nausea and vomiting)     Social History   Tobacco Use   Smoking status: Never   Smokeless tobacco: Never  Vaping Use   Vaping Use: Never used  Substance Use Topics   Alcohol use: Yes     Comment: occassionally   Drug use: No    No family history on file.  Allergies  Allergen Reactions   Ciprofloxacin     Other reaction(s): hard on stomach   Codeine Other (See Comments)    Mood changes    Erythromycin Nausea And Vomiting   Sulfonamide Derivatives Nausea And Vomiting   Sulfamethoxazole Rash    Health Maintenance  Topic Date Due   URINE MICROALBUMIN  Never done   Zoster Vaccines- Shingrix (1 of 2) Never done   Pneumonia Vaccine 83+ Years old (3 - PCV) 11/12/2014   INFLUENZA VACCINE  05/10/2021   COVID-19 Vaccine (5 - Booster for Pfizer series) 06/22/2021   TETANUS/TDAP  02/16/2023   COLONOSCOPY (Pts 45-33yrs Insurance coverage will need to be confirmed)  11/13/2029   Hepatitis C Screening  Completed   HPV VACCINES  Aged Out    Objective:  Vitals:   09/07/21 1347  BP: 132/74  Pulse: 82  Resp: 16  Temp: 98 F (36.7 C)  TempSrc: Temporal  SpO2: 96%  Weight: 197 lb (89.4 kg)  Height: 5\' 10"  (1.778 m)   Body mass index is 28.27 kg/m.  Physical Exam Constitutional:      Comments: He is in good spirits.  Cardiovascular:     Rate and Rhythm: Normal rate and regular rhythm.     Heart sounds: No murmur heard. Pulmonary:     Effort: Pulmonary effort is normal.     Breath sounds: Normal breath sounds.  Abdominal:     Palpations: Abdomen is soft.     Tenderness: There is no abdominal tenderness.  Psychiatric:        Mood and Affect: Mood normal.    Lab Results Lab Results  Component Value Date   WBC 3.2 (L) 08/18/2021   HGB 13.1 (L) 08/18/2021   HCT 38.3 (L) 08/18/2021   MCV 93.4 08/18/2021   PLT 78 (L) 08/18/2021    Lab Results  Component Value Date   CREATININE 1.01 08/18/2021   BUN 12 08/18/2021   NA 141 08/18/2021   K 4.4 08/18/2021   CL 105 08/18/2021   CO2 25 08/18/2021    Lab Results  Component Value Date   ALT 40 08/18/2021   AST 27 08/18/2021   ALKPHOS 63 07/16/2018   BILITOT 0.9 08/18/2021    Lab Results   Component Value Date   CHOL 117 08/18/2021   HDL 36 (L) 08/18/2021   LDLCALC 56 08/18/2021   TRIG 186 (H) 08/18/2021   CHOLHDL 3.3 08/18/2021   Lab Results  Component Value Date   LABRPR NON-REACTIVE 08/18/2021   HIV 1 RNA Quant  Date Value  08/18/2021 Not Detected Copies/mL  07/28/2020 <20 Copies/mL  07/15/2019 <20 NOT DETECTED copies/mL   CD4 T Cell Abs (/uL)  Date Value  08/18/2021 283 (L)  07/28/2020 323 (L)  07/15/2019 444     Problem List Items Addressed This Visit       High   HIV (human immunodeficiency virus infection) ([place])    His infection remains under excellent, long-term control.  He will continue Genvoya and follow-up after lab work in 1 year.  I did encourage him to get the Shingrix vaccine.      Relevant Medications   elvitegravir-cobicistat-emtricitabine-tenofovir (GENVOYA) 150-150-200-10 MG TABS tablet   Other Relevant Orders   CBC   T-helper cell (CD4)- ([facility] clinic only)   Comprehensive metabolic panel   Lipid panel   RPR   HIV-1 RNA quant-no reflex-bld     Unprioritized   Renal oncocytoma, left    He was diagnosed with a left renal oncocytoma in 2019.  He tells me that he follows up annually with his urologist.  He has had serial, annual CT scans which has not shown any growth so he is simply being followed.      Pancytopenia ([place])    He has developed progressive pancytopenia of unknown cause.  It is unlikely that this is related to his well-controlled HIV infection or due to the [facility].  I suggested that he follow-up at the cancer center.      Other Visit Diagnoses     Human immunodeficiency virus (HIV) disease ([place])       Relevant Medications   elvitegravir-cobicistat-emtricitabine-tenofovir (GENVOYA) 150-150-200-10 MG TABS tablet         Michel Bickers, MD [facility] for Infectious [place] [phone] pager   [phone] cell 09/07/2021, 2:07 PM

## 2021-09-07 NOTE — Assessment & Plan Note (Signed)
He has developed progressive pancytopenia of unknown cause.  It is unlikely that this is related to his well-controlled HIV infection or due to the [facility].  I suggested that he follow-up at the cancer center.

## 2021-09-07 NOTE — Assessment & Plan Note (Signed)
His infection remains under excellent, long-term control.  He will continue Genvoya and follow-up after lab work in 1 year.  I did encourage him to get the Shingrix vaccine.

## 2021-09-07 NOTE — Assessment & Plan Note (Signed)
He was diagnosed with a left renal oncocytoma in 2019.  He tells me that he follows up annually with his urologist.  He has had serial, annual CT scans which has not shown any growth so he is simply being followed.

## 2021-10-12 NOTE — Progress Notes (Signed)
Patient calls and states he will need a new referral to cancer center due to her previous provider no longer being there. Patient would like a referral to Dr. Oliver Pila. Referral placed as verbal order per Dr. Megan Salon. Eugenia Mcalpine

## 2021-10-12 NOTE — Addendum Note (Signed)
Addended by: Eugenia Mcalpine on: 10/12/2021 10:48 AM   Modules accepted: Orders

## 2021-10-14 ENCOUNTER — Inpatient Hospital Stay: Payer: Medicare PPO | Attending: Hematology & Oncology

## 2021-10-14 ENCOUNTER — Encounter: Payer: Self-pay | Admitting: Hematology & Oncology

## 2021-10-14 ENCOUNTER — Other Ambulatory Visit: Payer: Self-pay

## 2021-10-14 ENCOUNTER — Inpatient Hospital Stay (HOSPITAL_BASED_OUTPATIENT_CLINIC_OR_DEPARTMENT_OTHER): Payer: Medicare PPO | Admitting: Hematology & Oncology

## 2021-10-14 VITALS — BP 127/73 | HR 77 | Temp 98.1°F | Resp 20 | Wt 197.0 lb

## 2021-10-14 DIAGNOSIS — D61818 Other pancytopenia: Secondary | ICD-10-CM

## 2021-10-14 DIAGNOSIS — Z21 Asymptomatic human immunodeficiency virus [HIV] infection status: Secondary | ICD-10-CM | POA: Diagnosis not present

## 2021-10-14 LAB — CBC WITH DIFFERENTIAL (CANCER CENTER ONLY)
Abs Immature Granulocytes: 0.03 10*3/uL (ref 0.00–0.07)
Basophils Absolute: 0 10*3/uL (ref 0.0–0.1)
Basophils Relative: 0 %
Eosinophils Absolute: 0.1 10*3/uL (ref 0.0–0.5)
Eosinophils Relative: 2 %
HCT: 36.3 % — ABNORMAL LOW (ref 39.0–52.0)
Hemoglobin: 13.5 g/dL (ref 13.0–17.0)
Immature Granulocytes: 1 %
Lymphocytes Relative: 27 %
Lymphs Abs: 1.2 10*3/uL (ref 0.7–4.0)
MCH: 32 pg (ref 26.0–34.0)
MCHC: 37.2 g/dL — ABNORMAL HIGH (ref 30.0–36.0)
MCV: 86 fL (ref 80.0–100.0)
Monocytes Absolute: 0.3 10*3/uL (ref 0.1–1.0)
Monocytes Relative: 7 %
Neutro Abs: 2.8 10*3/uL (ref 1.7–7.7)
Neutrophils Relative %: 63 %
Platelet Count: 109 10*3/uL — ABNORMAL LOW (ref 150–400)
RBC: 4.22 MIL/uL (ref 4.22–5.81)
RDW: 13.7 % (ref 11.5–15.5)
WBC Count: 4.5 10*3/uL (ref 4.0–10.5)
nRBC: 0 % (ref 0.0–0.2)

## 2021-10-14 LAB — CMP (CANCER CENTER ONLY)
ALT: 54 U/L — ABNORMAL HIGH (ref 0–44)
AST: 35 U/L (ref 15–41)
Albumin: 4.3 g/dL (ref 3.5–5.0)
Alkaline Phosphatase: 60 U/L (ref 38–126)
Anion gap: 7 (ref 5–15)
BUN: 17 mg/dL (ref 8–23)
CO2: 27 mmol/L (ref 22–32)
Calcium: 9.6 mg/dL (ref 8.9–10.3)
Chloride: 102 mmol/L (ref 98–111)
Creatinine: 1.13 mg/dL (ref 0.61–1.24)
GFR, Estimated: >60 mL/min (ref >60)
Glucose, Bld: 124 mg/dL — ABNORMAL HIGH (ref 70–99)
Potassium: 4.3 mmol/L (ref 3.5–5.1)
Sodium: 136 mmol/L (ref 135–145)
Total Bilirubin: 1.4 mg/dL — ABNORMAL HIGH (ref 0.3–1.2)
Total Protein: 6.4 g/dL — ABNORMAL LOW (ref 6.5–8.1)

## 2021-10-14 LAB — RETICULOCYTES
Immature Retic Fract: 14.1 % (ref 2.3–15.9)
RBC.: 4.2 MIL/uL — ABNORMAL LOW (ref 4.22–5.81)
Retic Count, Absolute: 241.1 10*3/uL — ABNORMAL HIGH (ref 19.0–186.0)
Retic Ct Pct: 5.7 % — ABNORMAL HIGH (ref 0.4–3.1)

## 2021-10-14 LAB — VITAMIN B12: Vitamin B-12: 593 pg/mL (ref 180–914)

## 2021-10-14 LAB — SAVE SMEAR(SSMR), FOR PROVIDER SLIDE REVIEW

## 2021-10-14 NOTE — Progress Notes (Signed)
Referral MD  Reason for Referral: Mild pancytopenia  Chief Complaint  Patient presents with   New Patient (Initial Visit)  : My blood counts are low.  HPI: Brett Sims is a very nice 77 year old white male.  He is originally from [place].  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  He lives with his husband in [place].  He has an incredibly interesting sideline.  He raises daylilies.  He says he has about 500 kinds in his backyard.  He also has HIV.  He was diagnosed back in 2004.  He has had no opportunistic infections.  He has been very well controlled.  He is Genvoya for this.  He is being followed by Dr. Megan Salon of Infectious Diseases.  He has been noted to have a slight decrease in his blood counts.  Dr. Megan Salon does not think this is from the envoy.  As such, he wanted Brett Sims to be seen at the [facility] for an evaluation.  Brett Sims has not had problems with infections.  He has had no weight loss or weight gain.  He did gain a little weight over the holiday season.  He does love to cook.  He has had no issues with bowels or bladder.  He has had no bleeding.  He has had no rashes.  He has had no issues with COVID.  He does not smoke.  He has occasional glass of wine.  Of note, back in 2019, his white cell count was 4.0.  Hemoglobin 13.  Platelet count 102,000.  In November 2022, his white cell count was 3.2.  Hemoglobin 13.1.  Platelet count 78,000.  The MCV was 93.  He has had past surgeries.  Looks like he had melanoma taken off his back.  He thinks this back in 2007.  He says that there were no lymph nodes that were positive.    Overall, I would say his performance status is probably ECOG 0.   Past Medical History:  Diagnosis Date   Cholecystolithiasis    Complication of anesthesia    GERD (gastroesophageal reflux disease)    HIV disease ([place])    PONV (postoperative nausea and vomiting)   :   Past Surgical History:   Procedure Laterality Date   CHOLECYSTECTOMY N/A 04/24/2014   Procedure: LAPAROSCOPIC CHOLECYSTECTOMY;  Surgeon: Harl Bowie, MD;  Location: [facility];  Service: General;  Laterality: N/A;   COLONOSCOPY WITH PROPOFOL N/A 08/19/2014   Procedure: COLONOSCOPY WITH PROPOFOL;  Surgeon: Garlan Fair, MD;  Location: [facility];  Service: Endoscopy;  Laterality: N/A;   EYE SURGERY     bil. cataract surgery with lens implants   HERNIA REPAIR     INNER EAR SURGERY     IR US GUIDANCE  10/05/2018   MELANOMA EXCISION     from back   POLYPECTOMY  2010    removed one foot of intestine    by Dr Wynetta Emery  :   Current Outpatient Medications:    allopurinol (ZYLOPRIM) 100 MG tablet, Take 200 mg by mouth daily., Disp: , Rfl: 3   carboxymethylcellulose (REFRESH PLUS) 0.5 % SOLN, Place 1 drop into both eyes 3 (three) times daily as needed (dry eyes)., Disp: , Rfl:    CLARAVIS 20 MG capsule, Take 20 mg by mouth daily., Disp: , Rfl:    Clindamycin Phosphate foam, Apply 1 application topically 2 (two) times daily as needed (dermatitis).  (Patient not taking: Reported on  08/18/2020), Disp: , Rfl:    COMBIGAN 0.2-0.5 % ophthalmic solution, Place 1 drop into both eyes 2 (two) times daily. , Disp: , Rfl:    elvitegravir-cobicistat-emtricitabine-tenofovir (GENVOYA) 150-150-200-10 MG TABS tablet, TAKE ONE TABLET BY MOUTH ONCE DAILY WITH FOOD. STORE IN ORIGINAL CONTAINER AT ROOM TEMPERATURE., Disp: 30 tablet, Rfl: 11   ketoconazole (NIZORAL) 2 % cream, Apply 1 application topically daily as needed for irritation., Disp: , Rfl:    latanoprost (XALATAN) 0.005 % ophthalmic solution, Place 1 drop into both eyes at bedtime. , Disp: , Rfl:    loperamide (IMODIUM A-D) 2 MG tablet, Take 2 mg by mouth every morning. , Disp: , Rfl:    Multiple Vitamin (MULTIVITAMIN) tablet, Take 1 tablet by mouth every morning. , Disp: , Rfl:    mupirocin ointment (BACTROBAN) 2 %, Place 1 application into the nose daily as needed ("bump").  (Patient not taking: Reported on 09/07/2021), Disp: , Rfl:    pantoprazole (PROTONIX) 40 MG tablet, Take 40 mg by mouth daily., Disp: , Rfl: 3   pregabalin (LYRICA) 50 MG capsule, Take 50 mg by mouth 2 (two) times daily., Disp: , Rfl:    RESTASIS 0.05 % ophthalmic emulsion, Place 1 drop into both eyes 2 (two) times daily. (Patient not taking: Reported on 09/07/2021), Disp: , Rfl: 3   sildenafil (VIAGRA) 25 MG tablet, Take 1-2 tablets by mouth as needed (Patient taking differently: Take 50-75 mg by mouth as needed for erectile dysfunction. Take 1-2 tablets by mouth as needed), Disp: 30 tablet, Rfl: 1   tamsulosin (FLOMAX) 0.4 MG CAPS capsule, Take 0.4 mg by mouth daily., Disp: , Rfl:    XIIDRA 5 % SOLN, Apply 1 drop to eye 2 (two) times daily., Disp: , Rfl: :  :   Allergies  Allergen Reactions   Ciprofloxacin     Other reaction(s): hard on stomach   Codeine Other (See Comments)    Mood changes    Erythromycin Nausea And Vomiting   Sulfonamide Derivatives Nausea And Vomiting   Sulfamethoxazole Rash  :  History reviewed. No pertinent family history.:   Social History   Socioeconomic History   Marital status: Married    Spouse name: Not on file   Number of children: Not on file   Years of education: Not on file   Highest education level: Not on file  Occupational History   Not on file  Tobacco Use   Smoking status: Never   Smokeless tobacco: Never  Vaping Use   Vaping Use: Never used  Substance and Sexual Activity   Alcohol use: Yes    Comment: occassionally   Drug use: No   Sexual activity: Yes    Partners: Male    Comment: declined condoms  Other Topics Concern   Not on file  Social History Narrative   Not on file   Social Determinants of Health   Financial Resource Strain: Not on file  Food Insecurity: Not on file  Transportation Needs: Not on file  Physical Activity: Not on file  Stress: Not on file  Social Connections: Not on file  Intimate Partner  Violence: Not on file  :  Review of Systems  Constitutional: Negative.   HENT: Negative.    Eyes: Negative.   Respiratory: Negative.    Cardiovascular: Negative.   Gastrointestinal: Negative.   Genitourinary: Negative.   Musculoskeletal: Negative.   Skin: Negative.   Neurological: Negative.   Endo/Heme/Allergies: Negative.   Psychiatric/Behavioral: Negative.  Exam: @IPVITALS @ Physical Exam Vitals reviewed.  HENT:     Head: Normocephalic and atraumatic.  Eyes:     Pupils: Pupils are equal, round, and reactive to light.  Cardiovascular:     Rate and Rhythm: Normal rate and regular rhythm.     Heart sounds: Normal heart sounds.  Pulmonary:     Effort: Pulmonary effort is normal.     Breath sounds: Normal breath sounds.  Abdominal:     General: Bowel sounds are normal.     Palpations: Abdomen is soft.  Musculoskeletal:        General: No tenderness or deformity. Normal range of motion.     Cervical back: Normal range of motion.  Lymphadenopathy:     Cervical: No cervical adenopathy.  Skin:    General: Skin is warm and dry.     Findings: No erythema or rash.  Neurological:     Mental Status: He is alert and oriented to person, place, and time.  Psychiatric:        Behavior: Behavior normal.        Thought Content: Thought content normal.        Judgment: Judgment normal.     Recent Labs    10/14/21 1355  WBC 4.5  HGB 13.5  HCT 36.3*  PLT 109*    Recent Labs    10/14/21 1355  NA 136  K 4.3  CL 102  CO2 27  GLUCOSE 124*  BUN 17  CREATININE 1.13  CALCIUM 9.6    Blood smear review: There is normochromic and normocytic population of red blood cells.  There is no nucleated red blood cells.  I see no teardrop cells.  There are no schistocytes or spherocytes.  I see no rouleaux formation.  White blood cells appear normal in morphology and maturation.  I see no immature myeloid or lymphoid forms.  There may be a few hypersegmented polys.  I see no  blasts.  Platelets are slightly decreased in number.  Platelets are well granulated.  Pathology: None    Assessment and Plan: Brett Sims is a very nice 76 year old white male.  He has HIV.  This is well controlled.  His blood counts are slightly better today.  I am not sure as to why they are on the lower side previously.  He has always had lower platelets.  You do have to worry about the possibility of myelodysplasia.  However, I do think that he is not symptomatic and that his blood counts are good enough that we do not have to expose him to a bone marrow biopsy right now.  Will be interesting to see what his vitamin B12 level is.  He does have a few hypersegmented polys on his blood smear.  I cannot imagine that he has anything in the bone marrow that would be HIV related.  I do not think he would have lymphoma or any type of malignancy in the bone marrow.  I do not think he would have any type of operative sick infection in the bone marrow given that the HIV is under good control and that he likely has a good CD4 count.  For right now, I think it be worth while just to follow him along.  I will plan to get him back probably in about 3 or 4 months so we can see how his blood counts look at that time.  Again, he is very interesting to talk to.  He is obviously very [place]  and very well spoken and very much enjoying his life with he and his husband.

## 2021-10-15 LAB — FERRITIN: Ferritin: 59 ng/mL (ref 24–336)

## 2021-10-15 LAB — IRON AND IRON BINDING CAPACITY (CC-WL,HP ONLY)
Iron: 154 ug/dL (ref 45–182)
Saturation Ratios: 38 % (ref 17.9–39.5)
TIBC: 406 ug/dL (ref 250–450)
UIBC: 252 ug/dL (ref 117–376)

## 2021-10-15 LAB — ERYTHROPOIETIN: Erythropoietin: 27.8 m[IU]/mL — ABNORMAL HIGH (ref 2.6–18.5)

## 2021-10-19 DIAGNOSIS — L738 Other specified follicular disorders: Secondary | ICD-10-CM | POA: Diagnosis not present

## 2021-10-19 DIAGNOSIS — Z85828 Personal history of other malignant neoplasm of skin: Secondary | ICD-10-CM | POA: Diagnosis not present

## 2021-10-19 DIAGNOSIS — D485 Neoplasm of uncertain behavior of skin: Secondary | ICD-10-CM | POA: Diagnosis not present

## 2021-10-19 DIAGNOSIS — L821 Other seborrheic keratosis: Secondary | ICD-10-CM | POA: Diagnosis not present

## 2021-10-19 DIAGNOSIS — D2271 Melanocytic nevi of right lower limb, including hip: Secondary | ICD-10-CM | POA: Diagnosis not present

## 2021-10-19 DIAGNOSIS — L905 Scar conditions and fibrosis of skin: Secondary | ICD-10-CM | POA: Diagnosis not present

## 2021-10-19 DIAGNOSIS — L570 Actinic keratosis: Secondary | ICD-10-CM | POA: Diagnosis not present

## 2021-10-19 DIAGNOSIS — B078 Other viral warts: Secondary | ICD-10-CM | POA: Diagnosis not present

## 2021-10-22 LAB — MYELODYSPLASTIC SYNDROME (MDS) PANEL

## 2021-11-10 ENCOUNTER — Telehealth: Payer: Self-pay | Admitting: *Deleted

## 2021-11-10 NOTE — Telephone Encounter (Signed)
Call received from [facility] requesting additional information to be faxed to [phone] in regards to genetic testing, [phone].  Most recent labs, office note, demographics and insurance card faxed per [employer]'s request.

## 2021-11-16 DIAGNOSIS — Z85828 Personal history of other malignant neoplasm of skin: Secondary | ICD-10-CM | POA: Diagnosis not present

## 2021-11-16 DIAGNOSIS — C44622 Squamous cell carcinoma of skin of right upper limb, including shoulder: Secondary | ICD-10-CM | POA: Diagnosis not present

## 2021-12-01 DIAGNOSIS — H16223 Keratoconjunctivitis sicca, not specified as Sjogren's, bilateral: Secondary | ICD-10-CM | POA: Diagnosis not present

## 2021-12-01 DIAGNOSIS — H401123 Primary open-angle glaucoma, left eye, severe stage: Secondary | ICD-10-CM | POA: Diagnosis not present

## 2021-12-01 DIAGNOSIS — Z961 Presence of intraocular lens: Secondary | ICD-10-CM | POA: Diagnosis not present

## 2021-12-01 DIAGNOSIS — H0102B Squamous blepharitis left eye, upper and lower eyelids: Secondary | ICD-10-CM | POA: Diagnosis not present

## 2021-12-01 DIAGNOSIS — H401111 Primary open-angle glaucoma, right eye, mild stage: Secondary | ICD-10-CM | POA: Diagnosis not present

## 2021-12-01 DIAGNOSIS — D3131 Benign neoplasm of right choroid: Secondary | ICD-10-CM | POA: Diagnosis not present

## 2021-12-01 DIAGNOSIS — H0102A Squamous blepharitis right eye, upper and lower eyelids: Secondary | ICD-10-CM | POA: Diagnosis not present

## 2021-12-01 DIAGNOSIS — H43393 Other vitreous opacities, bilateral: Secondary | ICD-10-CM | POA: Diagnosis not present

## 2021-12-23 DIAGNOSIS — T162XXA Foreign body in left ear, initial encounter: Secondary | ICD-10-CM | POA: Diagnosis not present

## 2022-01-13 ENCOUNTER — Encounter: Payer: Self-pay | Admitting: Hematology & Oncology

## 2022-01-13 ENCOUNTER — Inpatient Hospital Stay: Payer: Medicare PPO | Attending: Hematology & Oncology

## 2022-01-13 ENCOUNTER — Other Ambulatory Visit: Payer: Self-pay

## 2022-01-13 ENCOUNTER — Inpatient Hospital Stay: Payer: Medicare PPO | Admitting: Hematology & Oncology

## 2022-01-13 VITALS — BP 123/56 | HR 74 | Temp 98.1°F | Resp 18 | Ht 70.0 in | Wt 186.1 lb

## 2022-01-13 DIAGNOSIS — Z79899 Other long term (current) drug therapy: Secondary | ICD-10-CM | POA: Insufficient documentation

## 2022-01-13 DIAGNOSIS — D61818 Other pancytopenia: Secondary | ICD-10-CM | POA: Diagnosis not present

## 2022-01-13 LAB — CBC WITH DIFFERENTIAL (CANCER CENTER ONLY)
Abs Immature Granulocytes: 0.01 10*3/uL (ref 0.00–0.07)
Basophils Absolute: 0 10*3/uL (ref 0.0–0.1)
Basophils Relative: 0 %
Eosinophils Absolute: 0.1 10*3/uL (ref 0.0–0.5)
Eosinophils Relative: 3 %
HCT: 35.2 % — ABNORMAL LOW (ref 39.0–52.0)
Hemoglobin: 13.2 g/dL (ref 13.0–17.0)
Immature Granulocytes: 0 %
Lymphocytes Relative: 27 %
Lymphs Abs: 1.1 10*3/uL (ref 0.7–4.0)
MCH: 33 pg (ref 26.0–34.0)
MCHC: 37.5 g/dL — ABNORMAL HIGH (ref 30.0–36.0)
MCV: 88 fL (ref 80.0–100.0)
Monocytes Absolute: 0.2 10*3/uL (ref 0.1–1.0)
Monocytes Relative: 6 %
Neutro Abs: 2.6 10*3/uL (ref 1.7–7.7)
Neutrophils Relative %: 64 %
Platelet Count: 89 10*3/uL — ABNORMAL LOW (ref 150–400)
RBC: 4 MIL/uL — ABNORMAL LOW (ref 4.22–5.81)
RDW: 14.3 % (ref 11.5–15.5)
WBC Count: 4.1 10*3/uL (ref 4.0–10.5)
nRBC: 0 % (ref 0.0–0.2)

## 2022-01-13 LAB — CMP (CANCER CENTER ONLY)
ALT: 22 U/L (ref 0–44)
AST: 22 U/L (ref 15–41)
Albumin: 4.2 g/dL (ref 3.5–5.0)
Alkaline Phosphatase: 59 U/L (ref 38–126)
Anion gap: 8 (ref 5–15)
BUN: 17 mg/dL (ref 8–23)
CO2: 26 mmol/L (ref 22–32)
Calcium: 9.1 mg/dL (ref 8.9–10.3)
Chloride: 104 mmol/L (ref 98–111)
Creatinine: 1.2 mg/dL (ref 0.61–1.24)
GFR, Estimated: >60 mL/min (ref >60)
Glucose, Bld: 139 mg/dL — ABNORMAL HIGH (ref 70–99)
Potassium: 3.9 mmol/L (ref 3.5–5.1)
Sodium: 138 mmol/L (ref 135–145)
Total Bilirubin: 1.2 mg/dL (ref 0.3–1.2)
Total Protein: 6.1 g/dL — ABNORMAL LOW (ref 6.5–8.1)

## 2022-01-13 LAB — SAVE SMEAR(SSMR), FOR PROVIDER SLIDE REVIEW

## 2022-01-13 LAB — VITAMIN B12: Vitamin B-12: 493 pg/mL (ref 180–914)

## 2022-01-13 NOTE — Progress Notes (Signed)
?Hematology and Oncology Follow Up Visit ? ?Brett Sims ?[id number] ?1946-06-11 76 y.o. ?01/13/2022 ? ? 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 ?Current Therapy:   ?Observation ?    ?Interim History:  Mr. Gautreau is back for follow-up.  He was last seen back in January.  At that time, our work-up was.  Unremarkable.  He had a normal vitamin B12 level.  His erythropoietin level was 28.  His iron studies showed a ferritin of 59 with an iron saturation of 38%. ? ?He feels good right now.  He has had no problems since we last saw him.  He and his husband will be going to an in-laws house for Easter. ? ?They will be going to [place] I think in the late spring or summer for a convention. ? ?He has had no problems with fatigue or weakness.  He has had no bleeding or bruising.  He has had no change in bowel or bladder habits.  He has had no cough.  There has been no fever.  He has had no headache.  He has had no leg swelling. ? ?He did have shingles in the back of his left thigh recently.  He is on Valtrex for this.  He said it was a small patch of blisters. ? ?Overall, I would have to say his performance status is probably ECOG is 0. ? ?Medications:  ?Current Outpatient Medications:  ?  allopurinol (ZYLOPRIM) 100 MG tablet, Take 200 mg by mouth daily., Disp: , Rfl: 3 ?  carboxymethylcellulose (REFRESH PLUS) 0.5 % SOLN, Place 1 drop into both eyes 3 (three) times daily as needed (dry eyes)., Disp: , Rfl:  ?  CLARAVIS 20 MG capsule, Take 20 mg by mouth daily., Disp: , Rfl:  ?  elvitegravir-cobicistat-emtricitabine-tenofovir (GENVOYA) 150-150-200-10 MG TABS tablet, TAKE ONE TABLET BY MOUTH ONCE DAILY WITH FOOD. STORE IN ORIGINAL CONTAINER AT ROOM TEMPERATURE., Disp: 30 tablet, Rfl: 11 ?  ketoconazole (NIZORAL) 2 % cream, Apply 1 application topically daily as needed for irritation., Disp: , Rfl:  ?  latanoprost (XALATAN) 0.005 % ophthalmic solution, Place 1  drop into both eyes at bedtime. , Disp: , Rfl:  ?  loperamide (IMODIUM A-D) 2 MG tablet, Take 2 mg by mouth every morning. , Disp: , Rfl:  ?  Multiple Vitamin (MULTIVITAMIN) tablet, Take 1 tablet by mouth every morning. , Disp: , Rfl:  ?  pantoprazole (PROTONIX) 40 MG tablet, Take 40 mg by mouth daily., Disp: , Rfl: 3 ?  pregabalin (LYRICA) 50 MG capsule, Take 50 mg by mouth 2 (two) times daily., Disp: , Rfl:  ?  sildenafil (VIAGRA) 25 MG tablet, Take 1-2 tablets by mouth as needed (Patient taking differently: Take 50-75 mg by mouth as needed for erectile dysfunction. Take 1-2 tablets by mouth as needed), Disp: 30 tablet, Rfl: 1 ?  tamsulosin (FLOMAX) 0.4 MG CAPS capsule, Take 0.4 mg by mouth daily., Disp: , Rfl:  ?  XIIDRA 5 % SOLN, Apply 1 drop to eye 2 (two) times daily., Disp: , Rfl:  ? ?Allergies:  ?Allergies  ?Allergen Reactions  ? Ciprofloxacin Other (See Comments)  ?  Other reaction(s): hard on stomach  ? Codeine Other (See Comments)  ?  Mood changes   ? Erythromycin Nausea And Vomiting  ? Sulfonamide Derivatives Nausea And Vomiting  ? Sulfamethoxazole Rash  ? ? ?Past Medical History, Surgical history, Social history, and Family History were reviewed and updated. ? ?Review  of Systems: ?Review of Systems  ?Constitutional: Negative.   ?HENT:  Negative.    ?Eyes: Negative.   ?Respiratory: Negative.    ?Cardiovascular: Negative.   ?Gastrointestinal: Negative.   ?Endocrine: Negative.   ?Genitourinary: Negative.    ?Musculoskeletal: Negative.   ?Skin: Negative.   ?Neurological: Negative.   ?Hematological: Negative.   ?Psychiatric/Behavioral: Negative.    ? ?Physical Exam: ? height is '5\' 10"'$  (1.778 m) and weight is 186 lb 1.9 oz (84.4 kg). His oral temperature is 98.1 ?F (36.7 ?C). His blood pressure is 123/56 (abnormal) and his pulse is 74. His respiration is 18 and oxygen saturation is 99%.  ? ?Wt Readings from Last 3 Encounters:  ?01/13/22 186 lb 1.9 oz (84.4 kg)  ?10/14/21 197 lb (89.4 kg)  ?09/07/21 197 lb  (89.4 kg)  ? ? ?Physical Exam ?Vitals reviewed.  ?HENT:  ?   Head: Normocephalic and atraumatic.  ?Eyes:  ?   Pupils: Pupils are equal, round, and reactive to light.  ?Cardiovascular:  ?   Rate and Rhythm: Normal rate and regular rhythm.  ?   Heart sounds: Normal heart sounds.  ?Pulmonary:  ?   Effort: Pulmonary effort is normal.  ?   Breath sounds: Normal breath sounds.  ?Abdominal:  ?   General: Bowel sounds are normal.  ?   Palpations: Abdomen is soft.  ?Musculoskeletal:     ?   General: No tenderness or deformity. Normal range of motion.  ?   Cervical back: Normal range of motion.  ?Lymphadenopathy:  ?   Cervical: No cervical adenopathy.  ?Skin: ?   General: Skin is warm and dry.  ?   Findings: No erythema or rash.  ?Neurological:  ?   Mental Status: He is alert and oriented to person, place, and time.  ?Psychiatric:     ?   Behavior: Behavior normal.     ?   Thought Content: Thought content normal.     ?   Judgment: Judgment normal.  ? ? ? ?Lab Results  ?Component Value Date  ? WBC 4.1 01/13/2022  ? HGB 13.2 01/13/2022  ? HCT 35.2 (L) 01/13/2022  ? MCV 88.0 01/13/2022  ? PLT 89 (L) 01/13/2022  ? ?  Chemistry   ?   ?Component Value Date/Time  ? NA 138 01/13/2022 1433  ? K 3.9 01/13/2022 1433  ? CL 104 01/13/2022 1433  ? CO2 26 01/13/2022 1433  ? BUN 17 01/13/2022 1433  ? CREATININE 1.20 01/13/2022 1433  ? CREATININE 1.01 08/18/2021 0857  ?    ?Component Value Date/Time  ? CALCIUM 9.1 01/13/2022 1433  ? ALKPHOS 59 01/13/2022 1433  ? AST 22 01/13/2022 1433  ? ALT 22 01/13/2022 1433  ? BILITOT 1.2 01/13/2022 1433  ?  ? ? ?Impression and Plan: ?Mr. Runions is a very nice 76 year old white male.  He has mild pancytopenia.  He does have HIV but this appears to be very well controlled.  He does have one of the mutations that we can see with myelodysplasia.  However, I would certainly would not consider him as myelo dysplasia.  He may be at risk for it.  He may have 1 of these "pre" myelodysplastic conditions. ? ?I still  think we can just follow him along.  He is totally asymptomatic.  He feels good.  I cannot find anything on his physical exam that would suggest a problem. ? ?I think we can probably get him back after Labor Day.  I  told him to let us know if he has any problems with bleeding or bruising.  Told to watch out for any count of rashes and to let us know.  He understood this. ? ? ?Volanda Napoleon, MD ?4/6/20233:26 PM  ?

## 2022-02-10 DIAGNOSIS — D7389 Other diseases of spleen: Secondary | ICD-10-CM | POA: Diagnosis not present

## 2022-02-10 DIAGNOSIS — K769 Liver disease, unspecified: Secondary | ICD-10-CM | POA: Diagnosis not present

## 2022-02-10 DIAGNOSIS — N200 Calculus of kidney: Secondary | ICD-10-CM | POA: Diagnosis not present

## 2022-02-10 DIAGNOSIS — D3002 Benign neoplasm of left kidney: Secondary | ICD-10-CM | POA: Diagnosis not present

## 2022-02-10 DIAGNOSIS — N281 Cyst of kidney, acquired: Secondary | ICD-10-CM | POA: Diagnosis not present

## 2022-02-17 DIAGNOSIS — D3002 Benign neoplasm of left kidney: Secondary | ICD-10-CM | POA: Diagnosis not present

## 2022-04-27 DIAGNOSIS — H401111 Primary open-angle glaucoma, right eye, mild stage: Secondary | ICD-10-CM | POA: Diagnosis not present

## 2022-04-27 DIAGNOSIS — H43393 Other vitreous opacities, bilateral: Secondary | ICD-10-CM | POA: Diagnosis not present

## 2022-04-27 DIAGNOSIS — H0102A Squamous blepharitis right eye, upper and lower eyelids: Secondary | ICD-10-CM | POA: Diagnosis not present

## 2022-04-27 DIAGNOSIS — H0102B Squamous blepharitis left eye, upper and lower eyelids: Secondary | ICD-10-CM | POA: Diagnosis not present

## 2022-04-27 DIAGNOSIS — D3131 Benign neoplasm of right choroid: Secondary | ICD-10-CM | POA: Diagnosis not present

## 2022-04-27 DIAGNOSIS — H401123 Primary open-angle glaucoma, left eye, severe stage: Secondary | ICD-10-CM | POA: Diagnosis not present

## 2022-04-27 DIAGNOSIS — Z961 Presence of intraocular lens: Secondary | ICD-10-CM | POA: Diagnosis not present

## 2022-05-09 DIAGNOSIS — Z85828 Personal history of other malignant neoplasm of skin: Secondary | ICD-10-CM | POA: Diagnosis not present

## 2022-05-09 DIAGNOSIS — D044 Carcinoma in situ of skin of scalp and neck: Secondary | ICD-10-CM | POA: Diagnosis not present

## 2022-05-09 DIAGNOSIS — L738 Other specified follicular disorders: Secondary | ICD-10-CM | POA: Diagnosis not present

## 2022-06-01 DIAGNOSIS — G608 Other hereditary and idiopathic neuropathies: Secondary | ICD-10-CM | POA: Diagnosis not present

## 2022-06-01 DIAGNOSIS — Z85820 Personal history of malignant melanoma of skin: Secondary | ICD-10-CM | POA: Diagnosis not present

## 2022-06-01 DIAGNOSIS — N400 Enlarged prostate without lower urinary tract symptoms: Secondary | ICD-10-CM | POA: Diagnosis not present

## 2022-06-01 DIAGNOSIS — K219 Gastro-esophageal reflux disease without esophagitis: Secondary | ICD-10-CM | POA: Diagnosis not present

## 2022-06-01 DIAGNOSIS — Z0000 Encounter for general adult medical examination without abnormal findings: Secondary | ICD-10-CM | POA: Diagnosis not present

## 2022-06-01 DIAGNOSIS — Z21 Asymptomatic human immunodeficiency virus [HIV] infection status: Secondary | ICD-10-CM | POA: Diagnosis not present

## 2022-06-01 DIAGNOSIS — Z1331 Encounter for screening for depression: Secondary | ICD-10-CM | POA: Diagnosis not present

## 2022-06-01 DIAGNOSIS — D510 Vitamin B12 deficiency anemia due to intrinsic factor deficiency: Secondary | ICD-10-CM | POA: Diagnosis not present

## 2022-06-01 DIAGNOSIS — R739 Hyperglycemia, unspecified: Secondary | ICD-10-CM | POA: Diagnosis not present

## 2022-06-01 DIAGNOSIS — I10 Essential (primary) hypertension: Secondary | ICD-10-CM | POA: Diagnosis not present

## 2022-06-01 DIAGNOSIS — M109 Gout, unspecified: Secondary | ICD-10-CM | POA: Diagnosis not present

## 2022-06-02 DIAGNOSIS — Z85828 Personal history of other malignant neoplasm of skin: Secondary | ICD-10-CM | POA: Diagnosis not present

## 2022-06-02 DIAGNOSIS — L738 Other specified follicular disorders: Secondary | ICD-10-CM | POA: Diagnosis not present

## 2022-06-02 DIAGNOSIS — L578 Other skin changes due to chronic exposure to nonionizing radiation: Secondary | ICD-10-CM | POA: Diagnosis not present

## 2022-06-02 DIAGNOSIS — L570 Actinic keratosis: Secondary | ICD-10-CM | POA: Diagnosis not present

## 2022-06-10 DIAGNOSIS — R7309 Other abnormal glucose: Secondary | ICD-10-CM | POA: Diagnosis not present

## 2022-06-15 DIAGNOSIS — Z85828 Personal history of other malignant neoplasm of skin: Secondary | ICD-10-CM | POA: Diagnosis not present

## 2022-06-15 DIAGNOSIS — C4442 Squamous cell carcinoma of skin of scalp and neck: Secondary | ICD-10-CM | POA: Diagnosis not present

## 2022-06-16 ENCOUNTER — Inpatient Hospital Stay: Payer: Medicare PPO

## 2022-06-16 ENCOUNTER — Inpatient Hospital Stay: Payer: Medicare PPO | Admitting: Hematology & Oncology

## 2022-06-21 ENCOUNTER — Inpatient Hospital Stay: Payer: Medicare PPO | Admitting: Medical Oncology

## 2022-06-21 ENCOUNTER — Inpatient Hospital Stay: Payer: Medicare PPO

## 2022-06-22 DIAGNOSIS — R739 Hyperglycemia, unspecified: Secondary | ICD-10-CM | POA: Diagnosis not present

## 2022-07-04 DIAGNOSIS — L570 Actinic keratosis: Secondary | ICD-10-CM | POA: Diagnosis not present

## 2022-08-08 ENCOUNTER — Inpatient Hospital Stay: Payer: Medicare PPO | Admitting: Hematology & Oncology

## 2022-08-08 ENCOUNTER — Inpatient Hospital Stay: Payer: Medicare PPO | Attending: Hematology & Oncology

## 2022-08-08 ENCOUNTER — Encounter: Payer: Self-pay | Admitting: Hematology & Oncology

## 2022-08-08 VITALS — BP 157/73 | HR 59 | Temp 98.4°F | Resp 20 | Ht 70.0 in | Wt 188.0 lb

## 2022-08-08 DIAGNOSIS — Z21 Asymptomatic human immunodeficiency virus [HIV] infection status: Secondary | ICD-10-CM | POA: Insufficient documentation

## 2022-08-08 DIAGNOSIS — D61818 Other pancytopenia: Secondary | ICD-10-CM | POA: Insufficient documentation

## 2022-08-08 LAB — CBC WITH DIFFERENTIAL (CANCER CENTER ONLY)
Abs Immature Granulocytes: 0.02 10*3/uL (ref 0.00–0.07)
Basophils Absolute: 0 10*3/uL (ref 0.0–0.1)
Basophils Relative: 0 %
Eosinophils Absolute: 0.1 10*3/uL (ref 0.0–0.5)
Eosinophils Relative: 2 %
HCT: 36.1 % — ABNORMAL LOW (ref 39.0–52.0)
Hemoglobin: 13.1 g/dL (ref 13.0–17.0)
Immature Granulocytes: 1 %
Lymphocytes Relative: 26 %
Lymphs Abs: 1.1 10*3/uL (ref 0.7–4.0)
MCH: 31.5 pg (ref 26.0–34.0)
MCHC: 36.3 g/dL — ABNORMAL HIGH (ref 30.0–36.0)
MCV: 86.8 fL (ref 80.0–100.0)
Monocytes Absolute: 0.3 10*3/uL (ref 0.1–1.0)
Monocytes Relative: 7 %
Neutro Abs: 2.7 10*3/uL (ref 1.7–7.7)
Neutrophils Relative %: 64 %
Platelet Count: 102 10*3/uL — ABNORMAL LOW (ref 150–400)
RBC: 4.16 MIL/uL — ABNORMAL LOW (ref 4.22–5.81)
RDW: 13.4 % (ref 11.5–15.5)
WBC Count: 4.2 10*3/uL (ref 4.0–10.5)
nRBC: 0 % (ref 0.0–0.2)

## 2022-08-08 LAB — CMP (CANCER CENTER ONLY)
ALT: 25 U/L (ref 0–44)
AST: 23 U/L (ref 15–41)
Albumin: 4.3 g/dL (ref 3.5–5.0)
Alkaline Phosphatase: 61 U/L (ref 38–126)
Anion gap: 8 (ref 5–15)
BUN: 14 mg/dL (ref 8–23)
CO2: 27 mmol/L (ref 22–32)
Calcium: 9.2 mg/dL (ref 8.9–10.3)
Chloride: 105 mmol/L (ref 98–111)
Creatinine: 1.17 mg/dL (ref 0.61–1.24)
GFR, Estimated: >60 mL/min (ref >60)
Glucose, Bld: 170 mg/dL — ABNORMAL HIGH (ref 70–99)
Potassium: 4.2 mmol/L (ref 3.5–5.1)
Sodium: 140 mmol/L (ref 135–145)
Total Bilirubin: 1.1 mg/dL (ref 0.3–1.2)
Total Protein: 6.4 g/dL — ABNORMAL LOW (ref 6.5–8.1)

## 2022-08-08 LAB — IRON AND IRON BINDING CAPACITY (CC-WL,HP ONLY)
Iron: 97 ug/dL (ref 45–182)
Saturation Ratios: 25 % (ref 17.9–39.5)
TIBC: 396 ug/dL (ref 250–450)
UIBC: 299 ug/dL (ref 117–376)

## 2022-08-08 LAB — LACTATE DEHYDROGENASE: LDH: 188 U/L (ref 98–192)

## 2022-08-08 LAB — FERRITIN: Ferritin: 23 ng/mL — ABNORMAL LOW (ref 24–336)

## 2022-08-08 NOTE — Progress Notes (Signed)
Hematology and Oncology Follow Up Visit  Brett Sims [id number] [birth year] 76 y.o. 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  Overall, he has been doing quite well.  We last saw him back in April.  Since then, he has had no problems.  He had no problems over the summer.  He and his husband will be enjoying the holidays.  They will stay local and visit family down in [place].  He has had no problems with bleeding.  There is been no rashes.  He has had no fever.  He has had no nausea or vomiting.  There is been no change in bowel or bladder habits.  He has had no swollen lymph nodes.  Overall, I was his performance status is probably ECOG 0.    Medications:  Current Outpatient Medications:    allopurinol (ZYLOPRIM) 100 MG tablet, Take 200 mg by mouth daily., Disp: , Rfl: 3   brimonidine-timolol (COMBIGAN) 0.2-0.5 % ophthalmic solution, Apply 1 drop to eye 2 (two) times daily., Disp: , Rfl:    carboxymethylcellulose (REFRESH PLUS) 0.5 % SOLN, Place 1 drop into both eyes 3 (three) times daily as needed (dry eyes)., Disp: , Rfl:    CLARAVIS 20 MG capsule, Take 20 mg by mouth 3 (three) times a week., Disp: , Rfl:    elvitegravir-cobicistat-emtricitabine-tenofovir (GENVOYA) 150-150-200-10 MG TABS tablet, TAKE ONE TABLET BY MOUTH ONCE DAILY WITH FOOD. STORE IN ORIGINAL CONTAINER AT ROOM TEMPERATURE., Disp: 30 tablet, Rfl: 11   latanoprost (XALATAN) 0.005 % ophthalmic solution, Place 1 drop into both eyes at bedtime. , Disp: , Rfl:    loperamide (IMODIUM A-D) 2 MG tablet, Take 2 mg by mouth every morning. , Disp: , Rfl:    Multiple Vitamin (MULTIVITAMIN) tablet, Take 1 tablet by mouth every morning. , Disp: , Rfl:    omeprazole (PRILOSEC) 40 MG capsule, Take 40 mg by mouth every morning., Disp: , Rfl:    pregabalin (LYRICA) 50 MG  capsule, Take 50 mg by mouth 2 (two) times daily., Disp: , Rfl:    sildenafil (REVATIO) 20 MG tablet, Take 60 mg by mouth daily as needed., Disp: , Rfl:    tamsulosin (FLOMAX) 0.4 MG CAPS capsule, Take 0.4 mg by mouth 2 (two) times daily., Disp: , Rfl:    XIIDRA 5 % SOLN, Apply 1 drop to eye daily., Disp: , Rfl:    ketoconazole (NIZORAL) 2 % cream, Apply 1 application topically daily as needed for irritation. (Patient not taking: Reported on 08/08/2022), Disp: , Rfl:   Allergies:  Allergies  Allergen Reactions   Ciprofloxacin Other (See Comments)    Other reaction(s): hard on stomach   Codeine Other (See Comments)    Mood changes    Erythromycin Nausea And Vomiting   Sulfonamide Derivatives Nausea And Vomiting   Sulfamethoxazole Rash    Past Medical History, Surgical history, Social history, and Family History were reviewed and updated.  Review of Systems: Review of Systems  Constitutional: Negative.   HENT:  Negative.    Eyes: Negative.   Respiratory: Negative.    Cardiovascular: Negative.   Gastrointestinal: Negative.   Endocrine: Negative.   Genitourinary: Negative.    Musculoskeletal: Negative.   Skin: Negative.   Neurological: Negative.   Hematological: Negative.   Psychiatric/Behavioral: Negative.      Physical Exam:  height  is '5\' 10"'$  (1.778 m) and weight is 188 lb (85.3 kg). His oral temperature is 98.4 F (36.9 C). His blood pressure is 157/73 (abnormal) and his pulse is 59 (abnormal). His respiration is 20 and oxygen saturation is 98%.   Wt Readings from Last 3 Encounters:  08/08/22 188 lb (85.3 kg)  01/13/22 186 lb 1.9 oz (84.4 kg)  10/14/21 197 lb (89.4 kg)    Physical Exam Vitals reviewed.  HENT:     Head: Normocephalic and atraumatic.  Eyes:     Pupils: Pupils are equal, round, and reactive to light.  Cardiovascular:     Rate and Rhythm: Normal rate and regular rhythm.     Heart sounds: Normal heart sounds.  Pulmonary:     Effort: Pulmonary effort  is normal.     Breath sounds: Normal breath sounds.  Abdominal:     General: Bowel sounds are normal.     Palpations: Abdomen is soft.  Musculoskeletal:        General: No tenderness or deformity. Normal range of motion.     Cervical back: Normal range of motion.  Lymphadenopathy:     Cervical: No cervical adenopathy.  Skin:    General: Skin is warm and dry.     Findings: No erythema or rash.  Neurological:     Mental Status: He is alert and oriented to person, place, and time.  Psychiatric:        Behavior: Behavior normal.        Thought Content: Thought content normal.        Judgment: Judgment normal.      Lab Results  Component Value Date   WBC 4.2 08/08/2022   HGB 13.1 08/08/2022   HCT 36.1 (L) 08/08/2022   MCV 86.8 08/08/2022   PLT 102 (L) 08/08/2022     Chemistry      Component Value Date/Time   NA 140 08/08/2022 0903   K 4.2 08/08/2022 0903   CL 105 08/08/2022 0903   CO2 27 08/08/2022 0903   BUN 14 08/08/2022 0903   CREATININE 1.17 08/08/2022 0903   CREATININE 1.01 08/18/2021 0857      Component Value Date/Time   CALCIUM 9.2 08/08/2022 0903   ALKPHOS 61 08/08/2022 0903   AST 23 08/08/2022 0903   ALT 25 08/08/2022 0903   BILITOT 1.1 08/08/2022 0903      Impression and Plan: Brett Sims is a very nice 76 year old white male.  He has mild pancytopenia.  He does have HIV but this appears to be very well controlled.  He does have one of the mutations that we can see with myelodysplasia.  However, I would certainly would not consider him as myelodysplasia.  He may be at risk for it.  He may have 1 of these "pre" myelodysplastic conditions.  I still think we can just follow him along.  He is totally asymptomatic.  He feels good.  I cannot find anything on his physical exam that would suggest a problem.  I think we can probably get him back in 6 months.  If he has any problems between now and then, he can always give [place] a call.     Volanda Napoleon,  MD 10/30/202310:26 AM

## 2022-08-09 ENCOUNTER — Other Ambulatory Visit: Payer: Self-pay

## 2022-08-09 ENCOUNTER — Other Ambulatory Visit: Payer: Medicare PPO

## 2022-08-09 DIAGNOSIS — Z21 Asymptomatic human immunodeficiency virus [HIV] infection status: Secondary | ICD-10-CM

## 2022-08-09 DIAGNOSIS — Z79899 Other long term (current) drug therapy: Secondary | ICD-10-CM | POA: Diagnosis not present

## 2022-08-10 LAB — T-HELPER CELL (CD4) - (RCID CLINIC ONLY)
CD4 % Helper T Cell: 36 % (ref 33–65)
CD4 T Cell Abs: 309 /uL — ABNORMAL LOW (ref 400–1790)

## 2022-08-11 LAB — CBC
HCT: 36.3 % — ABNORMAL LOW (ref 38.5–50.0)
Hemoglobin: 12.6 g/dL — ABNORMAL LOW (ref 13.2–17.1)
MCH: 31.6 pg (ref 27.0–33.0)
MCHC: 34.7 g/dL (ref 32.0–36.0)
MCV: 91 fL (ref 80.0–100.0)
MPV: 10.4 fL (ref 7.5–12.5)
Platelets: 86 10*3/uL — ABNORMAL LOW (ref 140–400)
RBC: 3.99 10*6/uL — ABNORMAL LOW (ref 4.20–5.80)
RDW: 13.8 % (ref 11.0–15.0)
WBC: 3 10*3/uL — ABNORMAL LOW (ref 3.8–10.8)

## 2022-08-11 LAB — COMPREHENSIVE METABOLIC PANEL
AG Ratio: 2 (calc) (ref 1.0–2.5)
ALT: 27 U/L (ref 9–46)
AST: 20 U/L (ref 10–35)
Albumin: 4.1 g/dL (ref 3.6–5.1)
Alkaline phosphatase (APISO): 65 U/L (ref 35–144)
BUN: 16 mg/dL (ref 7–25)
CO2: 30 mmol/L (ref 20–32)
Calcium: 9 mg/dL (ref 8.6–10.3)
Chloride: 105 mmol/L (ref 98–110)
Creat: 1.11 mg/dL (ref 0.70–1.28)
Globulin: 2.1 g/dL (calc) (ref 1.9–3.7)
Glucose, Bld: 147 mg/dL — ABNORMAL HIGH (ref 65–99)
Potassium: 4.2 mmol/L (ref 3.5–5.3)
Sodium: 140 mmol/L (ref 135–146)
Total Bilirubin: 1.1 mg/dL (ref 0.2–1.2)
Total Protein: 6.2 g/dL (ref 6.1–8.1)

## 2022-08-11 LAB — LIPID PANEL
Cholesterol: 112 mg/dL (ref <200)
HDL: 31 mg/dL — ABNORMAL LOW (ref >=40)
LDL Cholesterol (Calc): 51 mg/dL (calc)
Non-HDL Cholesterol (Calc): 81 mg/dL (calc) (ref <130)
Total CHOL/HDL Ratio: 3.6 (calc) (ref <5.0)
Triglycerides: 239 mg/dL — ABNORMAL HIGH (ref <150)

## 2022-08-11 LAB — HIV-1 RNA QUANT-NO REFLEX-BLD
HIV 1 RNA Quant: <20 Copies/mL — ABNORMAL HIGH
HIV-1 RNA Quant, Log: <1.3 Log cps/mL — ABNORMAL HIGH

## 2022-08-11 LAB — RPR: RPR Ser Ql: NONREACTIVE

## 2022-08-22 DIAGNOSIS — H401111 Primary open-angle glaucoma, right eye, mild stage: Secondary | ICD-10-CM | POA: Diagnosis not present

## 2022-08-22 DIAGNOSIS — H43393 Other vitreous opacities, bilateral: Secondary | ICD-10-CM | POA: Diagnosis not present

## 2022-08-22 DIAGNOSIS — D3131 Benign neoplasm of right choroid: Secondary | ICD-10-CM | POA: Diagnosis not present

## 2022-08-22 DIAGNOSIS — H0102A Squamous blepharitis right eye, upper and lower eyelids: Secondary | ICD-10-CM | POA: Diagnosis not present

## 2022-08-22 DIAGNOSIS — H0102B Squamous blepharitis left eye, upper and lower eyelids: Secondary | ICD-10-CM | POA: Diagnosis not present

## 2022-08-22 DIAGNOSIS — H401123 Primary open-angle glaucoma, left eye, severe stage: Secondary | ICD-10-CM | POA: Diagnosis not present

## 2022-08-22 DIAGNOSIS — Z961 Presence of intraocular lens: Secondary | ICD-10-CM | POA: Diagnosis not present

## 2022-08-23 ENCOUNTER — Other Ambulatory Visit: Payer: Self-pay

## 2022-08-23 ENCOUNTER — Encounter: Payer: Self-pay | Admitting: Internal Medicine

## 2022-08-23 ENCOUNTER — Ambulatory Visit: Payer: Medicare PPO | Admitting: Internal Medicine

## 2022-08-23 VITALS — BP 155/84 | HR 77 | Temp 98.5°F | Ht 70.0 in | Wt 193.0 lb

## 2022-08-23 DIAGNOSIS — B20 Human immunodeficiency virus [HIV] disease: Secondary | ICD-10-CM

## 2022-08-23 DIAGNOSIS — Z23 Encounter for immunization: Secondary | ICD-10-CM | POA: Diagnosis not present

## 2022-08-23 DIAGNOSIS — D61818 Other pancytopenia: Secondary | ICD-10-CM | POA: Diagnosis not present

## 2022-08-23 DIAGNOSIS — D3002 Benign neoplasm of left kidney: Secondary | ICD-10-CM

## 2022-08-23 DIAGNOSIS — Z21 Asymptomatic human immunodeficiency virus [HIV] infection status: Secondary | ICD-10-CM | POA: Diagnosis not present

## 2022-08-23 MED ORDER — GENVOYA 150-150-200-10 MG PO TABS: ORAL_TABLET | ORAL | 11 refills | Status: DC

## 2022-08-23 NOTE — Addendum Note (Signed)
Addended by: Adelfa Koh on: 08/23/2022 02:15 PM   Modules accepted: Orders

## 2022-08-23 NOTE — Assessment & Plan Note (Signed)
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

## 2022-08-23 NOTE — Assessment & Plan Note (Signed)
It is very unlikely that his pancytopenia is related to HIV infection or Genvoya.

## 2022-08-23 NOTE — Progress Notes (Signed)
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   BRIMONIDINE-TIMOLOL (COMBIGAN) 0.2-0.5 % OPHTHALMIC SOLUTION    Apply 1 drop to eye 2 (two) times daily.   CARBOXYMETHYLCELLULOSE (REFRESH PLUS) 0.5 % SOLN    Place 1 drop into both eyes 3 (three) times daily as needed (dry eyes).   CLARAVIS 20 MG CAPSULE    Take 20 mg by mouth 3 (three) times a week.   KETOCONAZOLE (NIZORAL) 2 % CREAM    Apply 1 application topically daily as needed for irritation.   LATANOPROST (XALATAN) 0.005 % OPHTHALMIC SOLUTION    Place 1 drop into both eyes at bedtime.    LOPERAMIDE (IMODIUM A-D) 2 MG TABLET    Take 2 mg by mouth every morning.    MULTIPLE VITAMIN (MULTIVITAMIN) TABLET    Take 1 tablet by mouth every morning.    OMEPRAZOLE (PRILOSEC) 40 MG CAPSULE    Take 40 mg by mouth every morning.   PREGABALIN (LYRICA) 50 MG CAPSULE    Take 50 mg by mouth 2 (two) times daily.   SILDENAFIL (REVATIO) 20 MG TABLET    Take 60 mg by mouth daily as needed.   TAMSULOSIN (FLOMAX) 0.4 MG CAPS CAPSULE    Take 0.4 mg by mouth 2 (two) times daily.   XIIDRA 5 % SOLN    Apply 1 drop to eye daily.  Modified  Medications   Modified Medication Previous Medication   ELVITEGRAVIR-COBICISTAT-EMTRICITABINE-TENOFOVIR (GENVOYA) 150-150-200-10 MG TABS TABLET elvitegravir-cobicistat-emtricitabine-tenofovir (GENVOYA) 150-150-200-10 MG TABS tablet      TAKE ONE TABLET BY MOUTH ONCE DAILY WITH FOOD. STORE IN ORIGINAL CONTAINER AT ROOM TEMPERATURE.    TAKE ONE TABLET BY MOUTH ONCE DAILY WITH FOOD. STORE IN ORIGINAL CONTAINER AT ROOM TEMPERATURE.  Discontinued Medications   No medications on file    Subjective: Brett Sims is in for his routine HIV follow-up visit.  He denies any problems obtaining, taking or tolerating his Genvoya and has not been missing doses.  He is feeling well.  He has had an annual influenza vaccine and the updated COVID vaccines.  He is currently seeing Dr. Lattie Haw at the cancer center for his pancytopenia.  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  He is going to be seen in 6 months.  He has continued to follow-up with his urologist for his left renal oncocytoma.  He tells me that it has not been increasing in size and his oncologist has said that he can follow-up in 5 years.  He continues to be bothered by intermittent acid reflux symptoms  but has no difficulty swallowing, specifically he does not have any difficulty getting his Genvoya down.  Review of Systems: Review of Systems  Constitutional:  Negative for fever and malaise/fatigue.  Respiratory:  Negative for cough.   Cardiovascular:  Negative for chest pain.  Gastrointestinal:  Positive for heartburn.  Skin:  Negative for rash.    Past Medical History:  Diagnosis Date   Cholecystolithiasis    Complication of anesthesia    GERD (gastroesophageal reflux disease)    HIV disease (HCC)    PONV (postoperative nausea and vomiting)     Social History   Tobacco Use   Smoking status: Never   Smokeless tobacco: Never  Vaping Use   Vaping Use: Never used   Substance Use Topics   Alcohol use: Yes    Comment: occassionally   Drug use: No    No family history on file.  Allergies  Allergen Reactions   Ciprofloxacin Other (See Comments)    Other reaction(s): hard on stomach   Codeine Other (See Comments)    Mood changes    Erythromycin Nausea And Vomiting   Sulfonamide Derivatives Nausea And Vomiting   Sulfamethoxazole Rash    Health Maintenance  Topic Date Due   Medicare Annual Wellness (AWV)  Never done   COVID-19 Vaccine (1) Never done   TETANUS/TDAP  Never done   Zoster Vaccines- Shingrix (1 of 2) Never done   INFLUENZA VACCINE  05/10/2022   COLONOSCOPY (Pts 45-50yr Insurance coverage will need to be confirmed)  11/13/2029   Pneumonia Vaccine 76 Years old  Completed   Hepatitis C Screening  Completed   HPV VACCINES  Aged Out    Objective:  Vitals:   08/23/22 1337  BP: (!) 155/84  Pulse: 77  Temp: 98.5 F (36.9 C)  TempSrc: Temporal  SpO2: 96%  Weight: 193 lb (87.5 kg)  Height: '5\' 10"'$  (1.778 m)   Body mass index is 27.69 kg/m.  Physical Exam Constitutional:      Comments: His spirits are good as usual.  Cardiovascular:     Rate and Rhythm: Normal rate and regular rhythm.     Heart sounds: No murmur heard. Pulmonary:     Effort: Pulmonary effort is normal.     Breath sounds: Normal breath sounds.  Psychiatric:        Mood and Affect: Mood normal.     Lab Results Lab Results  Component Value Date   WBC 3.0 (L) 08/09/2022   HGB 12.6 (L) 08/09/2022   HCT 36.3 (L) 08/09/2022   MCV 91.0 08/09/2022   PLT 86 (L) 08/09/2022    Lab Results  Component Value Date   CREATININE 1.11 08/09/2022   BUN 16 08/09/2022   NA 140 08/09/2022   K 4.2 08/09/2022   CL 105 08/09/2022   CO2 30 08/09/2022    Lab Results  Component Value Date   ALT 27 08/09/2022   AST 20 08/09/2022   ALKPHOS 61 08/08/2022   BILITOT 1.1 08/09/2022    Lab Results  Component Value Date   CHOL 112 08/09/2022   HDL 31 (L)  08/09/2022   LDLCALC 51 08/09/2022   TRIG 239 (H) 08/09/2022   CHOLHDL 3.6 08/09/2022   Lab Results  Component Value Date   LABRPR NON-REACTIVE 08/09/2022   HIV 1 RNA Quant (Copies/mL)  Date Value  08/09/2022 <20 (H)  08/18/2021 Not Detected  07/28/2020 <20   CD4 T Cell Abs (/uL)  Date Value  08/09/2022 309 (L)  08/18/2021 283 (L)  07/28/2020 323 (L)     Problem List Items Addressed This Visit       High   HIV (human immunodeficiency virus infection) ([place])    His infection remains under excellent, long-term control.  He will continue Genvoya and follow-up after lab work in 1 year.  He received Prevnar 20 here today.      Relevant Medications   elvitegravir-cobicistat-emtricitabine-tenofovir (GENVOYA) 150-150-200-10 MG TABS tablet   Other Relevant Orders   T-helper cells (CD4) count (not at [facility])   RPR   HIV-1 RNA quant-no reflex-bld     Unprioritized   Renal oncocytoma, left - Primary    Sounds like his oncocytoma is not increasing in size and the plan is to simply follow-up in 5 years with his urologist.      Pancytopenia ([place])    It is very unlikely that his pancytopenia is related to HIV infection or Genvoya.      Other Visit Diagnoses     Human immunodeficiency virus (HIV) disease ([place])       Relevant Medications   elvitegravir-cobicistat-emtricitabine-tenofovir (GENVOYA) 150-150-200-10 MG TABS tablet         Michel Bickers, MD [facility] [place] [phone] pager   [phone] cell 08/23/2022, 1:54 PM

## 2022-08-23 NOTE — Assessment & Plan Note (Signed)
His infection remains under excellent, long-term control.  He will continue Genvoya and follow-up after lab work in 1 year.  He received Prevnar 20 here today.

## 2022-10-31 ENCOUNTER — Telehealth: Payer: Self-pay

## 2022-10-31 NOTE — Telephone Encounter (Signed)
Received call from patient, he says PCP prescribed colchicine for gout flareups. They prescribed him to take 2 tablets daily for 10 days, however he states he only takes a few tablets as needed.   There is DDI with colchicine and Genvoya. Will route to pharmacy for advice.   Beryle Flock, RN

## 2022-11-01 NOTE — Telephone Encounter (Signed)
Spoke with Bill, relayed pharmacist's dosing instructions. Patient verbalized understanding and has no further questions.   Beryle Flock, RN

## 2022-11-01 NOTE — Telephone Encounter (Signed)
Technically for a gout flare he should only take one 0.6 tablet followed by 0.3 mg (half tablet) an hour later and then not repeat another dose for 3 days while taking Genvoya. Estill Bamberg

## 2023-02-06 ENCOUNTER — Inpatient Hospital Stay: Payer: Medicare PPO | Attending: Hematology & Oncology

## 2023-02-06 ENCOUNTER — Encounter: Payer: Self-pay | Admitting: Medical Oncology

## 2023-02-06 ENCOUNTER — Inpatient Hospital Stay: Payer: Medicare PPO | Admitting: Medical Oncology

## 2023-02-06 ENCOUNTER — Other Ambulatory Visit: Payer: Self-pay

## 2023-02-06 VITALS — BP 157/84 | Temp 97.9°F | Resp 16 | Ht 68.0 in | Wt 187.1 lb

## 2023-02-06 DIAGNOSIS — Z21 Asymptomatic human immunodeficiency virus [HIV] infection status: Secondary | ICD-10-CM | POA: Insufficient documentation

## 2023-02-06 DIAGNOSIS — D61818 Other pancytopenia: Secondary | ICD-10-CM | POA: Insufficient documentation

## 2023-02-06 LAB — CBC WITH DIFFERENTIAL (CANCER CENTER ONLY)
Abs Immature Granulocytes: 0.01 10*3/uL (ref 0.00–0.07)
Basophils Absolute: 0 10*3/uL (ref 0.0–0.1)
Basophils Relative: 0 %
Eosinophils Absolute: 0.1 10*3/uL (ref 0.0–0.5)
Eosinophils Relative: 3 %
HCT: 34.8 % — ABNORMAL LOW (ref 39.0–52.0)
Hemoglobin: 12.5 g/dL — ABNORMAL LOW (ref 13.0–17.0)
Immature Granulocytes: 0 %
Lymphocytes Relative: 25 %
Lymphs Abs: 0.9 10*3/uL (ref 0.7–4.0)
MCH: 30.4 pg (ref 26.0–34.0)
MCHC: 35.9 g/dL (ref 30.0–36.0)
MCV: 84.7 fL (ref 80.0–100.0)
Monocytes Absolute: 0.3 10*3/uL (ref 0.1–1.0)
Monocytes Relative: 7 %
Neutro Abs: 2.4 10*3/uL (ref 1.7–7.7)
Neutrophils Relative %: 65 %
Platelet Count: 82 10*3/uL — ABNORMAL LOW (ref 150–400)
RBC: 4.11 MIL/uL — ABNORMAL LOW (ref 4.22–5.81)
RDW: 14.1 % (ref 11.5–15.5)
WBC Count: 3.7 10*3/uL — ABNORMAL LOW (ref 4.0–10.5)
nRBC: 0 % (ref 0.0–0.2)

## 2023-02-06 LAB — LACTATE DEHYDROGENASE: LDH: 188 U/L (ref 98–192)

## 2023-02-06 LAB — CMP (CANCER CENTER ONLY)
ALT: 43 U/L (ref 0–44)
AST: 34 U/L (ref 15–41)
Albumin: 4 g/dL (ref 3.5–5.0)
Alkaline Phosphatase: 58 U/L (ref 38–126)
Anion gap: 8 (ref 5–15)
BUN: 16 mg/dL (ref 8–23)
CO2: 28 mmol/L (ref 22–32)
Calcium: 9 mg/dL (ref 8.9–10.3)
Chloride: 104 mmol/L (ref 98–111)
Creatinine: 1.12 mg/dL (ref 0.61–1.24)
GFR, Estimated: >60 mL/min (ref >60)
Glucose, Bld: 179 mg/dL — ABNORMAL HIGH (ref 70–99)
Potassium: 4.6 mmol/L (ref 3.5–5.1)
Sodium: 140 mmol/L (ref 135–145)
Total Bilirubin: 1.3 mg/dL — ABNORMAL HIGH (ref 0.3–1.2)
Total Protein: 6.2 g/dL — ABNORMAL LOW (ref 6.5–8.1)

## 2023-02-06 LAB — SAVE SMEAR(SSMR), FOR PROVIDER SLIDE REVIEW

## 2023-02-06 NOTE — Progress Notes (Signed)
Hematology and Oncology Follow Up Visit  Brett Sims [id number] 1946-10-04 77 y.o. 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  The last time we saw him was on 08/08/2022. Since then he has been doing well other than an episode of CAP in Feb. He reports that he has fully returned to baseline. He sees his infectious disease doctor in the fall for his yearly follow up. His HIV levels have been stable and undetectable.   He has had no problems with bleeding.  There is been no rashes.  He has had no fever.  He has had no nausea or vomiting.  There is been no change in bowel or bladder habits.  He has had no swollen lymph nodes.  Overall, I was his performance status is probably ECOG 0.   Wt Readings from Last 3 Encounters:  02/06/23 187 lb 1.3 oz (84.9 kg)  08/23/22 193 lb (87.5 kg)  08/08/22 188 lb (85.3 kg)    Medications:  Current Outpatient Medications:    allopurinol (ZYLOPRIM) 100 MG tablet, Take 200 mg by mouth daily., Disp: , Rfl: 3   brimonidine-timolol (COMBIGAN) 0.2-0.5 % ophthalmic solution, Apply 1 drop to eye 2 (two) times daily., Disp: , Rfl:    carboxymethylcellulose (REFRESH PLUS) 0.5 % SOLN, Place 1 drop into both eyes 3 (three) times daily as needed (dry eyes)., Disp: , Rfl:    CLARAVIS 20 MG capsule, Take 20 mg by mouth 3 (three) times a week., Disp: , Rfl:    elvitegravir-cobicistat-emtricitabine-tenofovir (GENVOYA) 150-150-200-10 MG TABS tablet, TAKE ONE TABLET BY MOUTH ONCE DAILY WITH FOOD. STORE IN ORIGINAL CONTAINER AT ROOM TEMPERATURE., Disp: 30 tablet, Rfl: 11   ketoconazole (NIZORAL) 2 % cream, Apply 1 application topically daily as needed for irritation. (Patient not taking: Reported on 08/08/2022), Disp: , Rfl:    latanoprost (XALATAN) 0.005 % ophthalmic solution, Place 1 drop into both eyes at  bedtime. , Disp: , Rfl:    loperamide (IMODIUM A-D) 2 MG tablet, Take 2 mg by mouth every morning. , Disp: , Rfl:    Multiple Vitamin (MULTIVITAMIN) tablet, Take 1 tablet by mouth every morning. , Disp: , Rfl:    omeprazole (PRILOSEC) 40 MG capsule, Take 40 mg by mouth every morning., Disp: , Rfl:    pregabalin (LYRICA) 50 MG capsule, Take 50 mg by mouth 2 (two) times daily., Disp: , Rfl:    sildenafil (REVATIO) 20 MG tablet, Take 60 mg by mouth daily as needed., Disp: , Rfl:    tamsulosin (FLOMAX) 0.4 MG CAPS capsule, Take 0.4 mg by mouth 2 (two) times daily., Disp: , Rfl:    XIIDRA 5 % SOLN, Apply 1 drop to eye daily., Disp: , Rfl:   Allergies:  Allergies  Allergen Reactions   Ciprofloxacin Other (See Comments)    Other reaction(s): hard on stomach   Codeine Other (See Comments)    Mood changes    Erythromycin Nausea And Vomiting   Sulfonamide Derivatives Nausea And Vomiting   Sulfamethoxazole Rash    Past Medical History, Surgical history, Social history, and Family History were reviewed and updated.  Review of Systems: Review of Systems  Constitutional: Negative.   HENT:  Negative.    Eyes: Negative.   Respiratory: Negative.    Cardiovascular: Negative.   Gastrointestinal: Negative.   Endocrine: Negative.  Genitourinary: Negative.    Musculoskeletal: Negative.   Skin: Negative.   Neurological: Negative.   Hematological: Negative.   Psychiatric/Behavioral: Negative.      Physical Exam:  height is 5\' 8"  (1.727 m) and weight is 187 lb 1.3 oz (84.9 kg). His oral temperature is 97.9 F (36.6 C). His blood pressure is 157/84 (abnormal). His respiration is 16 and oxygen saturation is 96%.   Wt Readings from Last 3 Encounters:  02/06/23 187 lb 1.3 oz (84.9 kg)  08/23/22 193 lb (87.5 kg)  08/08/22 188 lb (85.3 kg)    Physical Exam Vitals reviewed.  HENT:     Head: Normocephalic and atraumatic.  Eyes:     Pupils: Pupils are equal, round, and reactive to light.   Cardiovascular:     Rate and Rhythm: Normal rate and regular rhythm.     Heart sounds: Normal heart sounds.  Pulmonary:     Effort: Pulmonary effort is normal.     Breath sounds: Normal breath sounds.  Abdominal:     General: Bowel sounds are normal.     Palpations: Abdomen is soft.  Musculoskeletal:        General: No tenderness or deformity. Normal range of motion.     Cervical back: Normal range of motion.  Lymphadenopathy:     Cervical: No cervical adenopathy.  Skin:    General: Skin is warm and dry.     Findings: No erythema or rash.  Neurological:     Mental Status: He is alert and oriented to person, place, and time.  Psychiatric:        Behavior: Behavior normal.        Thought Content: Thought content normal.        Judgment: Judgment normal.      Lab Results  Component Value Date   WBC 3.7 (L) 02/06/2023   HGB 12.5 (L) 02/06/2023   HCT 34.8 (L) 02/06/2023   MCV 84.7 02/06/2023   PLT 82 (L) 02/06/2023     Chemistry      Component Value Date/Time   NA 140 08/09/2022 0940   K 4.2 08/09/2022 0940   CL 105 08/09/2022 0940   CO2 30 08/09/2022 0940   BUN 16 08/09/2022 0940   CREATININE 1.11 08/09/2022 0940      Component Value Date/Time   CALCIUM 9.0 08/09/2022 0940   ALKPHOS 61 08/08/2022 0903   AST 20 08/09/2022 0940   AST 23 08/08/2022 0903   ALT 27 08/09/2022 0940   ALT 25 08/08/2022 0903   BILITOT 1.1 08/09/2022 0940   BILITOT 1.1 08/08/2022 0903      Impression and Plan: Brett Sims is a very nice 77 year old white male.  He has mild pancytopenia.  He does have HIV but this appears to be very well controlled.  He does have one of the mutations that we can see with myelodysplasia.  Per Dr. Rexene Sims he may have a "pre" myelodysplastic conditions.  He continues to be asymptomatic. We reviewed his CBC and CMP. His values are fairly stable.   Disposition:  RTC 6 months APP, labs (CBC w/, CMP, LDH) -Brett Sims    Brett Chestnut, PA-C 4/29/20249:57 AM

## 2023-02-21 ENCOUNTER — Telehealth: Payer: Self-pay

## 2023-02-21 NOTE — Telephone Encounter (Signed)
You can respond to clinical team here. Marchelle Folks

## 2023-02-21 NOTE — Telephone Encounter (Signed)
Patient called with medication question. Routing to pharmacy with any advise. Patient would prefer a return phone call vs Mychart response.  Valarie Cones, LPN

## 2023-02-22 NOTE — Telephone Encounter (Signed)
Patient is aware of medication dosage recommended, he stated that he will try this to see if it works for the pain and swelling in his feet if not he will schedule an appointment with his pcp.

## 2023-06-13 ENCOUNTER — Telehealth: Payer: Self-pay

## 2023-06-13 NOTE — Telephone Encounter (Signed)
Immunization record faxed

## 2023-06-13 NOTE — Telephone Encounter (Signed)
Patient called to reschedule lab appt in 07/2023. He did have a question about some vaccinations that were administered, and nursing staff was able to assist.  Patient requested if we could fax or send the record of when his Pneumococcal Conjugate-20 was completed to his primary. They did not have record of it.   Dr. Jearld [facility] @ [facility]: [phone] F: [phone]

## 2023-06-23 NOTE — Progress Notes (Signed)
The ASCVD Risk score (Arnett DK, et al., 2019) failed to calculate for the following reasons:   The valid total cholesterol range is 130 to 320 mg/dL  Sandie Ano, RN

## 2023-08-08 ENCOUNTER — Other Ambulatory Visit: Payer: Medicare PPO

## 2023-08-09 ENCOUNTER — Other Ambulatory Visit: Payer: Self-pay

## 2023-08-09 ENCOUNTER — Inpatient Hospital Stay: Payer: Medicare PPO | Admitting: Medical Oncology

## 2023-08-09 ENCOUNTER — Inpatient Hospital Stay: Payer: Medicare PPO | Attending: Hematology & Oncology

## 2023-08-09 ENCOUNTER — Encounter: Payer: Self-pay | Admitting: Medical Oncology

## 2023-08-09 VITALS — BP 160/80 | HR 64 | Temp 98.1°F | Resp 18 | Wt 190.1 lb

## 2023-08-09 DIAGNOSIS — Z21 Asymptomatic human immunodeficiency virus [HIV] infection status: Secondary | ICD-10-CM | POA: Insufficient documentation

## 2023-08-09 DIAGNOSIS — D61818 Other pancytopenia: Secondary | ICD-10-CM

## 2023-08-09 DIAGNOSIS — R161 Splenomegaly, not elsewhere classified: Secondary | ICD-10-CM | POA: Diagnosis not present

## 2023-08-09 DIAGNOSIS — Z79899 Other long term (current) drug therapy: Secondary | ICD-10-CM

## 2023-08-09 DIAGNOSIS — K760 Fatty (change of) liver, not elsewhere classified: Secondary | ICD-10-CM

## 2023-08-09 DIAGNOSIS — B20 Human immunodeficiency virus [HIV] disease: Secondary | ICD-10-CM

## 2023-08-09 DIAGNOSIS — E781 Pure hyperglyceridemia: Secondary | ICD-10-CM | POA: Insufficient documentation

## 2023-08-09 DIAGNOSIS — Z113 Encounter for screening for infections with a predominantly sexual mode of transmission: Secondary | ICD-10-CM

## 2023-08-09 LAB — CMP (CANCER CENTER ONLY)
ALT: 33 U/L (ref 0–44)
AST: 26 U/L (ref 15–41)
Albumin: 4.2 g/dL (ref 3.5–5.0)
Alkaline Phosphatase: 61 U/L (ref 38–126)
Anion gap: 7 (ref 5–15)
BUN: 15 mg/dL (ref 8–23)
CO2: 27 mmol/L (ref 22–32)
Calcium: 8.9 mg/dL (ref 8.9–10.3)
Chloride: 105 mmol/L (ref 98–111)
Creatinine: 1.1 mg/dL (ref 0.61–1.24)
GFR, Estimated: >60 mL/min (ref >60)
Glucose, Bld: 280 mg/dL — ABNORMAL HIGH (ref 70–99)
Potassium: 4.1 mmol/L (ref 3.5–5.1)
Sodium: 139 mmol/L (ref 135–145)
Total Bilirubin: 1 mg/dL (ref 0.3–1.2)
Total Protein: 6.5 g/dL (ref 6.5–8.1)

## 2023-08-09 LAB — CBC WITH DIFFERENTIAL (CANCER CENTER ONLY)
Abs Immature Granulocytes: 0.01 10*3/uL (ref 0.00–0.07)
Basophils Absolute: 0 10*3/uL (ref 0.0–0.1)
Basophils Relative: 0 %
Eosinophils Absolute: 0.1 10*3/uL (ref 0.0–0.5)
Eosinophils Relative: 2 %
HCT: 33.3 % — ABNORMAL LOW (ref 39.0–52.0)
Hemoglobin: 11.9 g/dL — ABNORMAL LOW (ref 13.0–17.0)
Immature Granulocytes: 0 %
Lymphocytes Relative: 23 %
Lymphs Abs: 0.8 10*3/uL (ref 0.7–4.0)
MCH: 29.3 pg (ref 26.0–34.0)
MCHC: 35.7 g/dL (ref 30.0–36.0)
MCV: 82 fL (ref 80.0–100.0)
Monocytes Absolute: 0.2 10*3/uL (ref 0.1–1.0)
Monocytes Relative: 6 %
Neutro Abs: 2.3 10*3/uL (ref 1.7–7.7)
Neutrophils Relative %: 69 %
Platelet Count: 76 10*3/uL — ABNORMAL LOW (ref 150–400)
RBC: 4.06 MIL/uL — ABNORMAL LOW (ref 4.22–5.81)
RDW: 14.6 % (ref 11.5–15.5)
WBC Count: 3.4 10*3/uL — ABNORMAL LOW (ref 4.0–10.5)
nRBC: 0 % (ref 0.0–0.2)

## 2023-08-09 LAB — FERRITIN: Ferritin: 18 ng/mL — ABNORMAL LOW (ref 24–336)

## 2023-08-09 LAB — LACTATE DEHYDROGENASE: LDH: 206 U/L — ABNORMAL HIGH (ref 98–192)

## 2023-08-09 NOTE — Progress Notes (Signed)
Hematology and Oncology Follow Up Visit  Brett Sims [id number] Mar 28, 1946 77 y.o. 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  The last time we saw him was on 08/08/2022. Since then he has been doing well other than an episode of CAP in Feb. He reports that he has fully returned to baseline. He sees his infectious disease doctor in the fall for his yearly follow up. His HIV levels have been stable and undetectable.   Today he reports that he has been doing well. He has no concerns today.   He has a history of mild splenomegaly and hepatic steatosis. Tylenol use is rare. ETOH use is rare. Has history of elevated triglycerides.   He is good about getting his colonoscopy performed every 5 years.   He has had no problems with bleeding.  There is been no rashes.  He has had no fever.  He has had no nausea or vomiting.  There is been no change in bowel or bladder habits.  He has had no swollen lymph nodes.  Overall, I was his performance status is probably ECOG 0.   Wt Readings from Last 3 Encounters:  08/09/23 190 lb 1.3 oz (86.2 kg)  02/06/23 187 lb 1.3 oz (84.9 kg)  08/23/22 193 lb (87.5 kg)    Medications:  Current Outpatient Medications:    acetaminophen (TYLENOL) 500 MG tablet, Take by mouth., Disp: , Rfl:    allopurinol (ZYLOPRIM) 100 MG tablet, Take 200 mg by mouth daily., Disp: , Rfl: 3   brimonidine-timolol (COMBIGAN) 0.2-0.5 % ophthalmic solution, Apply 1 drop to eye 2 (two) times daily., Disp: , Rfl:    carboxymethylcellulose (REFRESH PLUS) 0.5 % SOLN, Place 1 drop into both eyes 3 (three) times daily as needed (dry eyes)., Disp: , Rfl:    CLARAVIS 20 MG capsule, Take 20 mg by mouth 3 (three) times a week., Disp: , Rfl:    colchicine 0.6 MG tablet, Take 0.6 mg by mouth 2 (two) times daily., Disp: , Rfl:     cyanocobalamin (VITAMIN B12) 1000 MCG tablet, 1 tablet Orally Once a day for 30 day(s), Disp: , Rfl:    cycloSPORINE (RESTASIS) 0.05 % ophthalmic emulsion, Apply to eye., Disp: , Rfl:    elvitegravir-cobicistat-emtricitabine-tenofovir (GENVOYA) 150-150-200-10 MG TABS tablet, TAKE ONE TABLET BY MOUTH ONCE DAILY WITH FOOD. STORE IN ORIGINAL CONTAINER AT ROOM TEMPERATURE., Disp: 30 tablet, Rfl: 11   ibuprofen (ADVIL) 800 MG tablet, Take by mouth., Disp: , Rfl:    ketoconazole (NIZORAL) 2 % cream, Apply 1 application  topically daily as needed for irritation., Disp: , Rfl:    latanoprost (XALATAN) 0.005 % ophthalmic solution, Place 1 drop into both eyes at bedtime. , Disp: , Rfl:    loperamide (IMODIUM A-D) 2 MG tablet, Take 2 mg by mouth every morning. , Disp: , Rfl:    Multiple Vitamin (MULTIVITAMIN) tablet, Take 1 tablet by mouth every morning. , Disp: , Rfl:    omeprazole (PRILOSEC) 40 MG capsule, Take 40 mg by mouth every morning., Disp: , Rfl:    Polyvinyl Alcohol-Povidone PF 1.4-0.6 % SOLN, 1 drop into affected eye as needed Ophthalmic 24 time(s) a day, Disp: , Rfl:    pregabalin (LYRICA) 50 MG capsule, Take 50 mg by mouth 2 (two) times daily., Disp: , Rfl:    ROCKLATAN 0.02-0.005 %  SOLN, Apply 1 drop to eye at bedtime., Disp: , Rfl:    sildenafil (REVATIO) 20 MG tablet, Take 60 mg by mouth daily as needed., Disp: , Rfl:    tamsulosin (FLOMAX) 0.4 MG CAPS capsule, Take 0.4 mg by mouth 2 (two) times daily., Disp: , Rfl:    XIIDRA 5 % SOLN, Apply 1 drop to eye daily., Disp: , Rfl:    cefdinir (OMNICEF) 300 MG capsule, Take 300 mg by mouth 2 (two) times daily. (Patient not taking: Reported on 08/09/2023), Disp: , Rfl:    levofloxacin (LEVAQUIN) 500 MG tablet, Take 500 mg by mouth daily. (Patient not taking: Reported on 08/09/2023), Disp: , Rfl:    predniSONE (STERAPRED UNI-PAK 21 TAB) 10 MG (21) TBPK tablet, Take by mouth as directed. (Patient not taking: Reported on 08/09/2023), Disp: , Rfl:     promethazine-dextromethorphan (PROMETHAZINE-DM) 6.25-15 MG/5ML syrup, Take by mouth. (Patient not taking: Reported on 08/09/2023), Disp: , Rfl:   Allergies:  Allergies  Allergen Reactions   Ciprofloxacin Other (See Comments) and Nausea And Vomiting    Other reaction(s): hard on stomach  Other Reaction(s): GI Intolerance   Codeine Other (See Comments)    Mood changes    Erythromycin Nausea And Vomiting   Sulfonamide Derivatives Nausea And Vomiting   Erythromycin Base     Other Reaction(s): Angioedema   Sulfamethoxazole Rash    Past Medical History, Surgical history, Social history, and Family History were reviewed and updated.  Review of Systems: Review of Systems  Constitutional: Negative.   HENT:  Negative.    Eyes: Negative.   Respiratory: Negative.    Cardiovascular: Negative.   Gastrointestinal: Negative.   Endocrine: Negative.   Genitourinary: Negative.    Musculoskeletal: Negative.   Skin: Negative.   Neurological: Negative.   Hematological: Negative.   Psychiatric/Behavioral: Negative.      Physical Exam:  weight is 190 lb 1.3 oz (86.2 kg). His oral temperature is 98.1 F (36.7 C). His blood pressure is 160/80 (abnormal) and his pulse is 64. His respiration is 18 and oxygen saturation is 99%.   Wt Readings from Last 3 Encounters:  08/09/23 190 lb 1.3 oz (86.2 kg)  02/06/23 187 lb 1.3 oz (84.9 kg)  08/23/22 193 lb (87.5 kg)    Physical Exam Vitals reviewed.  HENT:     Head: Normocephalic and atraumatic.  Eyes:     Pupils: Pupils are equal, round, and reactive to light.  Cardiovascular:     Rate and Rhythm: Normal rate and regular rhythm.     Heart sounds: Normal heart sounds.  Pulmonary:     Effort: Pulmonary effort is normal.     Breath sounds: Normal breath sounds.  Abdominal:     General: Bowel sounds are normal.     Palpations: Abdomen is soft.  Musculoskeletal:        General: No tenderness or deformity. Normal range of motion.     Cervical  back: Normal range of motion.  Lymphadenopathy:     Cervical: No cervical adenopathy.  Skin:    General: Skin is warm and dry.     Findings: No erythema or rash.  Neurological:     Mental Status: He is alert and oriented to person, place, and time.  Psychiatric:        Behavior: Behavior normal.        Thought Content: Thought content normal.        Judgment: Judgment normal.      Lab Results  Component Value Date   WBC 3.4 (L) 08/09/2023   HGB 11.9 (L) 08/09/2023   HCT 33.3 (L) 08/09/2023   MCV 82.0 08/09/2023   PLT 76 (L) 08/09/2023     Chemistry      Component Value Date/Time   NA 139 08/09/2023 0929   K 4.1 08/09/2023 0929   CL 105 08/09/2023 0929   CO2 27 08/09/2023 0929   BUN 15 08/09/2023 0929   CREATININE 1.10 08/09/2023 0929   CREATININE 1.11 08/09/2022 0940      Component Value Date/Time   CALCIUM 8.9 08/09/2023 0929   ALKPHOS 61 08/09/2023 0929   AST 26 08/09/2023 0929   ALT 33 08/09/2023 0929   BILITOT 1.0 08/09/2023 4098     Encounter Diagnoses  Name Primary?   Pancytopenia (HCC) Yes   Hepatic steatosis     Impression and Plan: Brett Sims is a very nice 77 year old white male.  He has mild pancytopenia.  He does have HIV but this appears to be very well controlled.  He does have one of the mutations that we can see with myelodysplasia.  Per Dr. Rexene Edison he may have a "pre" myelodysplastic conditions.  Overall labs are fairly stable. I would recommend GI evaluation given history of hepatic steatosis and labs.   Disposition:  RTC 6 months APP, labs (CBC w/, CMP, LDH, folate, B12, iron) -[place]    Rushie Chestnut, PA-C 10/30/202410:27 AM

## 2023-08-15 ENCOUNTER — Other Ambulatory Visit: Payer: Self-pay

## 2023-08-15 ENCOUNTER — Other Ambulatory Visit: Payer: Medicare PPO

## 2023-08-15 DIAGNOSIS — Z113 Encounter for screening for infections with a predominantly sexual mode of transmission: Secondary | ICD-10-CM

## 2023-08-15 DIAGNOSIS — B20 Human immunodeficiency virus [HIV] disease: Secondary | ICD-10-CM

## 2023-08-15 DIAGNOSIS — Z79899 Other long term (current) drug therapy: Secondary | ICD-10-CM

## 2023-08-16 LAB — T-HELPER CELL (CD4) - (RCID CLINIC ONLY)
CD4 % Helper T Cell: 34 % (ref 33–65)
CD4 T Cell Abs: 319 /uL — ABNORMAL LOW (ref 400–1790)

## 2023-08-17 LAB — LIPID PANEL
Cholesterol: 120 mg/dL (ref <200)
HDL: 30 mg/dL — ABNORMAL LOW (ref >=40)
Non-HDL Cholesterol (Calc): 90 mg/dL (ref <130)
Total CHOL/HDL Ratio: 4 (calc) (ref <5.0)
Triglycerides: 572 mg/dL — ABNORMAL HIGH (ref <150)

## 2023-08-17 LAB — RPR: RPR Ser Ql: NONREACTIVE

## 2023-08-17 LAB — HIV-1 RNA QUANT-NO REFLEX-BLD
HIV 1 RNA Quant: NOT DETECTED {copies}/mL
HIV-1 RNA Quant, Log: NOT DETECTED {Log_copies}/mL

## 2023-08-22 ENCOUNTER — Other Ambulatory Visit: Payer: Self-pay

## 2023-08-22 ENCOUNTER — Ambulatory Visit: Payer: Medicare PPO | Admitting: Internal Medicine

## 2023-08-22 VITALS — BP 131/77 | HR 75 | Temp 98.4°F | Ht 70.0 in | Wt 192.0 lb

## 2023-08-22 DIAGNOSIS — B20 Human immunodeficiency virus [HIV] disease: Secondary | ICD-10-CM

## 2023-08-22 NOTE — Patient Instructions (Signed)
Will revisit hep b recheck next visit and also anal cancer screening   Make sure you continue you talk to your primary care about age-appropriate cancer   Continue genvoya   See me in 6 months

## 2023-08-22 NOTE — Progress Notes (Signed)
Patient Active Problem List   Diagnosis Date Noted   Pancytopenia (HCC) 07/16/2018   Renal oncocytoma, left 07/04/2018   CKD (chronic kidney disease), stage III (HCC) 07/04/2018   Elevated liver enzymes 07/04/2018   Glaucoma 07/06/2017   Peripheral neuropathy 01/05/2016   Gout 07/07/2015   Erectile dysfunction 05/20/2014   Cholecystitis with cholelithiasis 04/24/2014   HYPERGLYCEMIA 04/09/2009   HYPERLIPIDEMIA 12/31/2007   HIV (human immunodeficiency virus infection) (HCC) 06/12/2007   GERD 06/12/2007   BENIGN PROSTATIC HYPERTROPHY 06/12/2007   History of respiratory system disease 06/12/2007   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   ALLOPURINOL (ZYLOPRIM) 100 MG TABLET    Take 200 mg by mouth daily.   BRIMONIDINE-TIMOLOL (COMBIGAN) 0.2-0.5 % OPHTHALMIC SOLUTION    Apply 1 drop to eye 2 (two) times daily.   CARBOXYMETHYLCELLULOSE (REFRESH PLUS) 0.5 % SOLN    Place 1 drop into both eyes 3 (three) times daily as needed (dry eyes).   CEFDINIR (OMNICEF) 300 MG CAPSULE    Take 300 mg by mouth 2 (two) times daily.   CLARAVIS 20 MG CAPSULE    Take 20 mg by mouth 3 (three) times a week.   COLCHICINE 0.6 MG TABLET    Take 0.6 mg by mouth 2 (two) times daily.   CYANOCOBALAMIN (VITAMIN B12) 1000 MCG TABLET    1 tablet Orally Once a day for 30 day(s)   CYCLOSPORINE (RESTASIS) 0.05 % OPHTHALMIC EMULSION    Apply to eye.   ELVITEGRAVIR-COBICISTAT-EMTRICITABINE-TENOFOVIR (GENVOYA) 150-150-200-10 MG TABS TABLET    TAKE ONE TABLET BY MOUTH ONCE DAILY WITH FOOD. STORE IN ORIGINAL CONTAINER AT ROOM TEMPERATURE.   IBUPROFEN (ADVIL) 800 MG TABLET    Take by mouth.   KETOCONAZOLE (NIZORAL) 2 % CREAM    Apply 1 application  topically daily as needed for irritation.   LATANOPROST (XALATAN) 0.005 % OPHTHALMIC SOLUTION    Place 1 drop  into both eyes at bedtime.    LEVOFLOXACIN (LEVAQUIN) 500 MG TABLET    Take 500 mg by mouth daily.   LOPERAMIDE (IMODIUM A-D) 2 MG TABLET    Take 2 mg by mouth every morning.    MULTIPLE VITAMIN (MULTIVITAMIN) TABLET    Take 1 tablet by mouth every morning.    OMEPRAZOLE (PRILOSEC) 40 MG CAPSULE    Take 40 mg by mouth every morning.   POLYVINYL ALCOHOL-POVIDONE PF 1.4-0.6 % SOLN    1 drop into affected eye as needed Ophthalmic 24 time(s) a day   PREDNISONE (STERAPRED UNI-PAK 21 TAB) 10 MG (21) TBPK TABLET    Take by mouth as directed.   PREGABALIN (LYRICA) 50 MG CAPSULE    Take 50 mg by mouth 2 (two) times daily.   PROMETHAZINE-DEXTROMETHORPHAN (PROMETHAZINE-DM) 6.25-15 MG/5ML SYRUP    Take by mouth.   ROCKLATAN 0.02-0.005 % SOLN    Apply 1 drop to eye at bedtime.   SILDENAFIL (REVATIO) 20 MG TABLET    Take 60 mg by mouth daily as needed.   TAMSULOSIN (FLOMAX) 0.4 MG CAPS CAPSULE    Take 0.4 mg by mouth 2 (two) times daily.   XIIDRA 5 % SOLN    Apply 1 drop to eye daily.  Modified Medications   No medications on file  Discontinued Medications  No medications on file    Subjective: Brett Sims is in for his routine HIV follow-up visit.  He denies any problems obtaining, taking or tolerating his Genvoya and has not been missing doses.  He is feeling well.  He has had an annual influenza vaccine and the updated COVID vaccines.  He is currently seeing Dr. Christin Bach at the cancer center for his pancytopenia.  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  He is going to be seen in 6 months.  He has continued to follow-up with his urologist for his left renal oncocytoma.  He tells me that it has not been increasing in size and his oncologist has said that he can follow-up in 5 years.  He continues to be bothered by intermittent acid reflux symptoms but has no difficulty swallowing, specifically he does not have any difficulty getting his  Genvoya down.   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 Compliant with genvoya no missed dose last 4 weeks  Open angle glaucoma Gout Erectyle dysfunction Bph Gerd Hypothyroidism Peripheral neuropathy B12 deficiency on oral replacement; 01/2022 normal level   Per patient he is seeing urology every other year - he is not taking anything for it  He is seeing dr Myna Hidalgo every 6 months for myelodysplasia  He needs his shingrix 2nd shot next month with pcp      Review of Systems: ROS No complaint today   Past Medical History:  Diagnosis Date   Cholecystolithiasis    Complication of anesthesia    GERD (gastroesophageal reflux disease)    HIV disease (HCC)    PONV (postoperative nausea and vomiting)     Social History   Tobacco Use   Smoking status: Never   Smokeless tobacco: Never  Vaping Use   Vaping status: Never Used  Substance Use Topics   Alcohol use: Yes    Comment: occassionally   Drug use: No    No family history on file.  Allergies  Allergen Reactions   Ciprofloxacin Other (See Comments) and Nausea And Vomiting    Other reaction(s): hard on stomach  Other Reaction(s): GI Intolerance   Codeine Other (See Comments)    Mood changes    Erythromycin Nausea And Vomiting   Sulfonamide Derivatives Nausea And Vomiting   Erythromycin Base     Other Reaction(s): Angioedema   Sulfamethoxazole Rash    Health Maintenance  Topic Date Due   Medicare Annual Wellness (AWV)  Never done   COVID-19 Vaccine (1) Never done   DTaP/Tdap/Td (1 - Tdap) Never done   Zoster Vaccines- Shingrix (1 of 2) 04/[birth year]/1967   INFLUENZA VACCINE  05/11/2023   Colonoscopy  11/13/2029   Pneumonia Vaccine 44+ Years old  Completed   Hepatitis C Screening  Completed   HPV VACCINES  Aged Out    Objective:  Vitals:   08/22/23 0952  Weight:  192 lb (87.1 kg)  Height: 5\' 10"  (1.778 m)   Body mass index is 27.55 kg/m.  Physical Exam Constitutional:      Comments: His spirits are good as usual.  Cardiovascular:     Rate and Rhythm: Normal rate and regular rhythm.     Heart sounds: No murmur heard. Pulmonary:     Effort: Pulmonary effort is normal.     Breath sounds: Normal breath sounds.  Psychiatric:  Mood and Affect: Mood normal.     Lab Results Lab Results  Component Value Date   WBC 3.4 (L) 08/09/2023   HGB 11.9 (L) 08/09/2023   HCT 33.3 (L) 08/09/2023   MCV 82.0 08/09/2023   PLT 76 (L) 08/09/2023    Lab Results  Component Value Date   CREATININE 1.10 08/09/2023   BUN 15 08/09/2023   NA 139 08/09/2023   K 4.1 08/09/2023   CL 105 08/09/2023   CO2 27 08/09/2023    Lab Results  Component Value Date   ALT 33 08/09/2023   AST 26 08/09/2023   ALKPHOS 61 08/09/2023   BILITOT 1.0 08/09/2023    Lab Results  Component Value Date   CHOL 120 08/15/2023   HDL 30 (L) 08/15/2023   LDLCALC  08/15/2023     Comment:     . LDL cholesterol not calculated. Triglyceride levels greater than 400 mg/dL invalidate calculated LDL results. . Reference range: <100 . Desirable range <100 mg/dL for primary prevention;   <70 mg/dL for patients with CHD or diabetic patients  with > or = 2 CHD risk factors. Marland Kitchen LDL-C is now calculated using the Martin-Hopkins  calculation, which is a validated novel method providing  better accuracy than the Friedewald equation in the  estimation of LDL-C.  Brett Sims et al. Lenox Ahr. 1610;960(45): 2061-2068  (http://education.[facility].com/faq/FAQ164)    TRIG 572 (H) 08/15/2023   CHOLHDL 4.0 08/15/2023   Lab Results  Component Value Date   LABRPR NON-REACTIVE 08/15/2023   HIV 1 RNA Quant (Copies/mL)  Date Value  08/15/2023 Not Detected  08/09/2022 <20 (H)  08/18/2021 Not Detected   CD4 T Cell Abs (/uL)  Date Value  08/15/2023 319 (L)  08/09/2022 309 (L)   08/18/2021 283 (L)   08/2023 34%   Problem List Items Addressed This Visit   None   #hiv msm On genvoya and well controlled. 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 [phone] pager   [phone] cell 08/22/2023, 9:55 AM

## 2023-09-04 ENCOUNTER — Telehealth: Payer: Self-pay

## 2023-09-04 DIAGNOSIS — B20 Human immunodeficiency virus [HIV] disease: Secondary | ICD-10-CM

## 2023-09-04 NOTE — Telephone Encounter (Signed)
Darrin Luis will need to be sent to [facility].   DDI with sildenafil and colchicine. Will route to provider.    Sandie Ano, RN

## 2023-09-04 NOTE — Telephone Encounter (Signed)
I reviewed dosing   His sildenafil as long as less than 25 mg every 48 hrs is ok  Colchcine prn but only half dose normally given is advised   He had refused to change ART  Thanks

## 2023-09-05 MED ORDER — GENVOYA 150-150-200-10 MG PO TABS: ORAL_TABLET | ORAL | 7 refills | Status: DC

## 2023-09-05 NOTE — Addendum Note (Signed)
Addended by: Linna Hoff D on: 09/05/2023 08:25 AM   Modules accepted: Orders

## 2023-09-05 NOTE — Telephone Encounter (Signed)
Notified Dr. Renold Don that patient is taking 60 mg sildenafil daily.   Per Dr. Renold Don:  "He has been on these forever with dr Orvan Falconer   Technically thats the advice  But if he tolerate it (no hypotension/dizziness)  Then its fine  We should document that we discussed with him the cobininceeased the sildenafil dose so prescribing advice is required"  Sandie Ano, RN

## 2023-10-27 DIAGNOSIS — Z21 Asymptomatic human immunodeficiency virus [HIV] infection status: Secondary | ICD-10-CM | POA: Diagnosis not present

## 2023-10-27 DIAGNOSIS — R300 Dysuria: Secondary | ICD-10-CM | POA: Diagnosis not present

## 2023-10-27 DIAGNOSIS — I10 Essential (primary) hypertension: Secondary | ICD-10-CM | POA: Diagnosis not present

## 2023-10-27 DIAGNOSIS — G608 Other hereditary and idiopathic neuropathies: Secondary | ICD-10-CM | POA: Diagnosis not present

## 2023-10-27 DIAGNOSIS — D61818 Other pancytopenia: Secondary | ICD-10-CM | POA: Diagnosis not present

## 2023-10-27 DIAGNOSIS — N400 Enlarged prostate without lower urinary tract symptoms: Secondary | ICD-10-CM | POA: Diagnosis not present

## 2023-10-31 DIAGNOSIS — R81 Glycosuria: Secondary | ICD-10-CM | POA: Diagnosis not present

## 2023-11-15 DIAGNOSIS — H43393 Other vitreous opacities, bilateral: Secondary | ICD-10-CM | POA: Diagnosis not present

## 2023-11-15 DIAGNOSIS — D3131 Benign neoplasm of right choroid: Secondary | ICD-10-CM | POA: Diagnosis not present

## 2023-11-15 DIAGNOSIS — H401111 Primary open-angle glaucoma, right eye, mild stage: Secondary | ICD-10-CM | POA: Diagnosis not present

## 2023-11-15 DIAGNOSIS — H0102B Squamous blepharitis left eye, upper and lower eyelids: Secondary | ICD-10-CM | POA: Diagnosis not present

## 2023-11-15 DIAGNOSIS — H0102A Squamous blepharitis right eye, upper and lower eyelids: Secondary | ICD-10-CM | POA: Diagnosis not present

## 2023-11-15 DIAGNOSIS — H401123 Primary open-angle glaucoma, left eye, severe stage: Secondary | ICD-10-CM | POA: Diagnosis not present

## 2023-11-15 DIAGNOSIS — Z961 Presence of intraocular lens: Secondary | ICD-10-CM | POA: Diagnosis not present

## 2023-11-20 DIAGNOSIS — D1801 Hemangioma of skin and subcutaneous tissue: Secondary | ICD-10-CM | POA: Diagnosis not present

## 2023-11-20 DIAGNOSIS — L738 Other specified follicular disorders: Secondary | ICD-10-CM | POA: Diagnosis not present

## 2023-11-20 DIAGNOSIS — Z85828 Personal history of other malignant neoplasm of skin: Secondary | ICD-10-CM | POA: Diagnosis not present

## 2023-12-01 DIAGNOSIS — G608 Other hereditary and idiopathic neuropathies: Secondary | ICD-10-CM | POA: Diagnosis not present

## 2023-12-01 DIAGNOSIS — N529 Male erectile dysfunction, unspecified: Secondary | ICD-10-CM | POA: Diagnosis not present

## 2023-12-01 DIAGNOSIS — I10 Essential (primary) hypertension: Secondary | ICD-10-CM | POA: Diagnosis not present

## 2023-12-01 DIAGNOSIS — N400 Enlarged prostate without lower urinary tract symptoms: Secondary | ICD-10-CM | POA: Diagnosis not present

## 2023-12-01 DIAGNOSIS — Z21 Asymptomatic human immunodeficiency virus [HIV] infection status: Secondary | ICD-10-CM | POA: Diagnosis not present

## 2023-12-07 DIAGNOSIS — R739 Hyperglycemia, unspecified: Secondary | ICD-10-CM | POA: Diagnosis not present

## 2023-12-08 DIAGNOSIS — T161XXA Foreign body in right ear, initial encounter: Secondary | ICD-10-CM | POA: Diagnosis not present

## 2023-12-08 DIAGNOSIS — T162XXA Foreign body in left ear, initial encounter: Secondary | ICD-10-CM | POA: Diagnosis not present

## 2023-12-22 DIAGNOSIS — Z21 Asymptomatic human immunodeficiency virus [HIV] infection status: Secondary | ICD-10-CM | POA: Diagnosis not present

## 2023-12-22 DIAGNOSIS — I10 Essential (primary) hypertension: Secondary | ICD-10-CM | POA: Diagnosis not present

## 2023-12-22 DIAGNOSIS — N529 Male erectile dysfunction, unspecified: Secondary | ICD-10-CM | POA: Diagnosis not present

## 2023-12-22 DIAGNOSIS — R7303 Prediabetes: Secondary | ICD-10-CM | POA: Diagnosis not present

## 2024-02-06 ENCOUNTER — Other Ambulatory Visit: Payer: Medicare PPO

## 2024-02-06 ENCOUNTER — Ambulatory Visit: Payer: Medicare PPO | Admitting: Medical Oncology

## 2024-02-06 DIAGNOSIS — D3002 Benign neoplasm of left kidney: Secondary | ICD-10-CM | POA: Diagnosis not present

## 2024-02-12 DIAGNOSIS — R161 Splenomegaly, not elsewhere classified: Secondary | ICD-10-CM | POA: Diagnosis not present

## 2024-02-12 DIAGNOSIS — N2889 Other specified disorders of kidney and ureter: Secondary | ICD-10-CM | POA: Diagnosis not present

## 2024-02-12 DIAGNOSIS — D3002 Benign neoplasm of left kidney: Secondary | ICD-10-CM | POA: Diagnosis not present

## 2024-02-12 DIAGNOSIS — N281 Cyst of kidney, acquired: Secondary | ICD-10-CM | POA: Diagnosis not present

## 2024-02-12 DIAGNOSIS — N200 Calculus of kidney: Secondary | ICD-10-CM | POA: Diagnosis not present

## 2024-02-13 ENCOUNTER — Encounter: Payer: Self-pay | Admitting: Internal Medicine

## 2024-02-13 ENCOUNTER — Other Ambulatory Visit: Payer: Self-pay

## 2024-02-13 ENCOUNTER — Ambulatory Visit: Payer: Medicare PPO | Admitting: Internal Medicine

## 2024-02-13 VITALS — BP 148/75 | HR 61 | Temp 97.9°F | Ht 70.0 in | Wt 185.0 lb

## 2024-02-13 DIAGNOSIS — H401123 Primary open-angle glaucoma, left eye, severe stage: Secondary | ICD-10-CM | POA: Diagnosis not present

## 2024-02-13 DIAGNOSIS — Z111 Encounter for screening for respiratory tuberculosis: Secondary | ICD-10-CM

## 2024-02-13 DIAGNOSIS — Z129 Encounter for screening for malignant neoplasm, site unspecified: Secondary | ICD-10-CM

## 2024-02-13 DIAGNOSIS — D3131 Benign neoplasm of right choroid: Secondary | ICD-10-CM | POA: Diagnosis not present

## 2024-02-13 DIAGNOSIS — Z1159 Encounter for screening for other viral diseases: Secondary | ICD-10-CM

## 2024-02-13 DIAGNOSIS — B20 Human immunodeficiency virus [HIV] disease: Secondary | ICD-10-CM | POA: Diagnosis not present

## 2024-02-13 DIAGNOSIS — Z961 Presence of intraocular lens: Secondary | ICD-10-CM | POA: Diagnosis not present

## 2024-02-13 DIAGNOSIS — H43393 Other vitreous opacities, bilateral: Secondary | ICD-10-CM | POA: Diagnosis not present

## 2024-02-13 DIAGNOSIS — H401111 Primary open-angle glaucoma, right eye, mild stage: Secondary | ICD-10-CM | POA: Diagnosis not present

## 2024-02-13 NOTE — Progress Notes (Signed)
 Patient Active Problem List   Diagnosis Date Noted   Pancytopenia (HCC) 07/16/2018   Renal oncocytoma, left 07/04/2018   CKD (chronic kidney disease), stage III (HCC) 07/04/2018   Elevated liver enzymes 07/04/2018   Glaucoma 07/06/2017   Peripheral neuropathy 01/05/2016   Gout 07/07/2015   Erectile dysfunction 05/20/2014   Cholecystitis with cholelithiasis 04/24/2014   HYPERGLYCEMIA 04/09/2009   HYPERLIPIDEMIA 12/31/2007   HIV (human immunodeficiency virus infection) (HCC) 06/12/2007   GERD 06/12/2007   BENIGN PROSTATIC HYPERTROPHY 06/12/2007   History of respiratory system disease 06/12/2007   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   ALLOPURINOL  (ZYLOPRIM ) 100 MG TABLET    Take 200 mg by mouth daily.   BRIMONIDINE -TIMOLOL  (COMBIGAN ) 0.2-0.5 % OPHTHALMIC SOLUTION    Apply 1 drop to eye 2 (two) times daily.   CARBOXYMETHYLCELLULOSE (REFRESH PLUS) 0.5 % SOLN    Place 1 drop into both eyes 3 (three) times daily as needed (dry eyes).   CEFDINIR (OMNICEF) 300 MG CAPSULE    Take 300 mg by mouth 2 (two) times daily.   CLARAVIS 20 MG CAPSULE    Take 20 mg by mouth 3 (three) times a week.   COLCHICINE 0.6 MG TABLET    Take 0.6 mg by mouth 2 (two) times daily.   CYANOCOBALAMIN  (VITAMIN B12) 1000 MCG TABLET    1 tablet Orally Once a day for 30 day(s)   CYCLOSPORINE  (RESTASIS ) 0.05 % OPHTHALMIC EMULSION    Apply to eye.   ELVITEGRAVIR-COBICISTAT-EMTRICITABINE-TENOFOVIR (GENVOYA ) 150-150-200-10 MG TABS TABLET    TAKE ONE TABLET BY MOUTH ONCE DAILY WITH FOOD. STORE IN ORIGINAL CONTAINER AT ROOM TEMPERATURE.   IBUPROFEN  (ADVIL ) 800 MG TABLET    Take by mouth.   KETOCONAZOLE (NIZORAL) 2 % CREAM    Apply 1 application  topically daily as needed for irritation.   LATANOPROST  (XALATAN ) 0.005 % OPHTHALMIC SOLUTION    Place 1 drop  into both eyes at bedtime.    LEVOFLOXACIN (LEVAQUIN) 500 MG TABLET    Take 500 mg by mouth daily.   LOPERAMIDE (IMODIUM A-D) 2 MG TABLET    Take 2 mg by mouth every morning.    MULTIPLE VITAMIN (MULTIVITAMIN) TABLET    Take 1 tablet by mouth every morning.    OMEPRAZOLE  (PRILOSEC) 40 MG CAPSULE    Take 40 mg by mouth every morning.   POLYVINYL ALCOHOL-POVIDONE PF 1.4-0.6 % SOLN    1 drop into affected eye as needed Ophthalmic 24 time(s) a day   PREDNISONE (STERAPRED UNI-PAK 21 TAB) 10 MG (21) TBPK TABLET    Take by mouth as directed.   PREGABALIN  (LYRICA ) 50 MG CAPSULE    Take 50 mg by mouth 2 (two) times daily.   PROMETHAZINE -DEXTROMETHORPHAN (PROMETHAZINE -DM) 6.25-15 MG/5ML SYRUP    Take by mouth.   ROCKLATAN 0.02-0.005 % SOLN    Apply 1 drop to eye at bedtime.   SILDENAFIL  (REVATIO ) 20 MG TABLET    Take 60 mg by mouth daily as needed.   TADALAFIL (CIALIS) 5 MG TABLET    1 tablets daily and an additional up 3 tablets as needed for sexual intercourse Orally Once a day for 30 days   TAMSULOSIN  (FLOMAX ) 0.4 MG CAPS CAPSULE    Take 0.4 mg by mouth 2 (two)  times daily.   TELMISARTAN (MICARDIS) 20 MG TABLET    1 Orally Once a day, prior to bedtime for 90 days   XIIDRA  5 % SOLN    Apply 1 drop to eye daily.  Modified Medications   No medications on file  Discontinued Medications   No medications on file    Subjective: Brett Sims is in for his routine HIV follow-up visit.  He denies any problems obtaining, taking or tolerating his Genvoya  and has not been missing doses.  He is feeling well.  He has had an annual influenza vaccine and the updated COVID vaccines.  He is currently seeing Dr. Rayleen Cal at the cancer center for his pancytopenia.  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  He is going to be seen in 6 months.  He has continued to follow-up with his urologist for his left renal oncocytoma.  He tells me that it has not been  increasing in size and his oncologist has said that he can follow-up in 5 years.  He continues to be bothered by intermittent acid reflux symptoms but has no difficulty swallowing, specifically he does not have any difficulty getting his Genvoya  down.   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 Compliant with genvoya  no missed dose last 4 weeks  Open angle glaucoma Gout Erectyle dysfunction Bph Gerd Hypothyroidism Peripheral neuropathy B12 deficiency on oral replacement; 01/2022 normal level   Per patient he is seeing urology every other year - he is not taking anything for it  He is seeing dr Maria Shiner every 6 months for myelodysplasia  He needs his shingrix 2nd shot next month with pcp    02/13/24 id clinic f/u Doing well Just had a ct scan for a renal lesion with his urologist He has been seeing dr Bolling Bushy for pancytopenia  Reviewed labs last visit He would like anal pap today   Review of Systems: ROS No complaint today   Past Medical History:  Diagnosis Date   Cholecystolithiasis    Complication of anesthesia    GERD (gastroesophageal reflux disease)    HIV disease (HCC)    PONV (postoperative nausea and vomiting)     Social History   Tobacco Use   Smoking status: Never   Smokeless tobacco: Never  Vaping Use   Vaping status: Never Used  Substance Use Topics   Alcohol use: Not Currently    Comment: occassionally   Drug use: No    No family history on file.  Allergies  Allergen Reactions   Ciprofloxacin  Other (See Comments) and Nausea And Vomiting    Other reaction(s): hard on stomach  Other Reaction(s): GI Intolerance   Codeine Other (See Comments)    Mood changes    Erythromycin Nausea And Vomiting   Sulfonamide Derivatives Nausea And Vomiting   Erythromycin Base     Other Reaction(s): Angioedema    Sulfamethoxazole Rash    Health Maintenance  Topic Date Due   COVID-19 Vaccine (1) Never done   DTaP/Tdap/Td (1 - Tdap) Never done   Zoster Vaccines- Shingrix (1 of 2) [birth year]1967   Medicare Annual Wellness (AWV)  06/02/2023   INFLUENZA VACCINE  05/10/2024   Colonoscopy  11/13/2029   Pneumonia Vaccine 75+ Years old  Completed   Hepatitis C Screening  Completed   HPV VACCINES  Aged Out   Meningococcal  B Vaccine  Aged Out    Objective:  Vitals:   02/13/24 1140  BP: (!) 148/75  Pulse: 61  Temp: 97.9 F (36.6 C)  TempSrc: Temporal  SpO2: 97%  Weight: 185 lb (83.9 kg)  Height: 5\' 10"  (1.778 m)   Body mass index is 26.54 kg/m.  Physical Exam Constitutional:      Comments: His spirits are good as usual.  Cardiovascular:     Rate and Rhythm: Normal rate and regular rhythm.     Heart sounds: No murmur Sims. Pulmonary:     Effort: Pulmonary effort is normal.     Breath sounds: Normal breath sounds.  Psychiatric:        Mood and Affect: Mood normal.     Lab Results Lab Results  Component Value Date   WBC 3.4 (L) 08/09/2023   HGB 11.9 (L) 08/09/2023   HCT 33.3 (L) 08/09/2023   MCV 82.0 08/09/2023   PLT 76 (L) 08/09/2023    Lab Results  Component Value Date   CREATININE 1.10 08/09/2023   BUN 15 08/09/2023   NA 139 08/09/2023   K 4.1 08/09/2023   CL 105 08/09/2023   CO2 27 08/09/2023    Lab Results  Component Value Date   ALT 33 08/09/2023   AST 26 08/09/2023   ALKPHOS 61 08/09/2023   BILITOT 1.0 08/09/2023    Lab Results  Component Value Date   CHOL 120 08/15/2023   HDL 30 (L) 08/15/2023   LDLCALC  08/15/2023     Comment:     . LDL cholesterol not calculated. Triglyceride levels greater than 400 mg/dL invalidate calculated LDL results. . Reference range: <100 . Desirable range <100 mg/dL for primary prevention;   <70 mg/dL for patients with CHD or diabetic patients  with > or = 2 CHD risk factors. Brett Sims LDL-C is now calculated using the  Brett Sims  calculation, which is a validated novel method providing  better accuracy than the Friedewald equation in the  estimation of LDL-C.  Brett Sims et al. Brett Sims. 1610;960(45): 2061-2068  (http://education.[facility].com/faq/FAQ164)    TRIG 572 (H) 08/15/2023   CHOLHDL 4.0 08/15/2023   Lab Results  Component Value Date   LABRPR NON-REACTIVE 08/15/2023   HIV 1 RNA Quant (Copies/mL)  Date Value  08/15/2023 Not Detected  08/09/2022 <20 (H)  08/18/2021 Not Detected   CD4 T Cell Abs (/uL)  Date Value  08/15/2023 319 (L)  08/09/2022 309 (L)  08/18/2021 283 (L)   08/2023 34%   Problem List Items Addressed This Visit   None Visit Diagnoses       Screening-pulmonary TB    -  Primary     HIV disease (HCC)         Need for hepatitis B screening test           #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

## 2024-02-13 NOTE — Patient Instructions (Signed)
 Will do anal pap today and blood test next visit   Pleasure seeing you again. Will see you in 6 months

## 2024-02-15 ENCOUNTER — Encounter: Payer: Self-pay | Admitting: Internal Medicine

## 2024-02-15 LAB — CYTOLOGY - NON PAP

## 2024-02-26 DIAGNOSIS — N200 Calculus of kidney: Secondary | ICD-10-CM | POA: Diagnosis not present

## 2024-02-26 DIAGNOSIS — R3912 Poor urinary stream: Secondary | ICD-10-CM | POA: Diagnosis not present

## 2024-02-26 DIAGNOSIS — D3002 Benign neoplasm of left kidney: Secondary | ICD-10-CM | POA: Diagnosis not present

## 2024-02-26 DIAGNOSIS — N401 Enlarged prostate with lower urinary tract symptoms: Secondary | ICD-10-CM | POA: Diagnosis not present

## 2024-02-27 ENCOUNTER — Encounter: Payer: Self-pay | Admitting: Medical Oncology

## 2024-02-27 ENCOUNTER — Ambulatory Visit: Payer: Self-pay | Admitting: Medical Oncology

## 2024-02-27 ENCOUNTER — Inpatient Hospital Stay: Attending: Hematology & Oncology

## 2024-02-27 ENCOUNTER — Inpatient Hospital Stay: Admitting: Medical Oncology

## 2024-02-27 VITALS — BP 123/65 | HR 72 | Temp 98.2°F | Resp 18 | Ht 70.0 in | Wt 185.0 lb

## 2024-02-27 DIAGNOSIS — D61818 Other pancytopenia: Secondary | ICD-10-CM

## 2024-02-27 DIAGNOSIS — N183 Chronic kidney disease, stage 3 unspecified: Secondary | ICD-10-CM | POA: Insufficient documentation

## 2024-02-27 DIAGNOSIS — K760 Fatty (change of) liver, not elsewhere classified: Secondary | ICD-10-CM | POA: Insufficient documentation

## 2024-02-27 DIAGNOSIS — Z21 Asymptomatic human immunodeficiency virus [HIV] infection status: Secondary | ICD-10-CM

## 2024-02-27 LAB — CBC WITH DIFFERENTIAL (CANCER CENTER ONLY)
Abs Immature Granulocytes: 0.02 10*3/uL (ref 0.00–0.07)
Basophils Absolute: 0 10*3/uL (ref 0.0–0.1)
Basophils Relative: 0 %
Eosinophils Absolute: 0.1 10*3/uL (ref 0.0–0.5)
Eosinophils Relative: 3 %
HCT: 34.7 % — ABNORMAL LOW (ref 39.0–52.0)
Hemoglobin: 12.5 g/dL — ABNORMAL LOW (ref 13.0–17.0)
Immature Granulocytes: 1 %
Lymphocytes Relative: 25 %
Lymphs Abs: 0.7 10*3/uL (ref 0.7–4.0)
MCH: 29.1 pg (ref 26.0–34.0)
MCHC: 36 g/dL (ref 30.0–36.0)
MCV: 80.9 fL (ref 80.0–100.0)
Monocytes Absolute: 0.2 10*3/uL (ref 0.1–1.0)
Monocytes Relative: 5 %
Neutro Abs: 2 10*3/uL (ref 1.7–7.7)
Neutrophils Relative %: 66 %
Platelet Count: 86 10*3/uL — ABNORMAL LOW (ref 150–400)
RBC: 4.29 MIL/uL (ref 4.22–5.81)
RDW: 14 % (ref 11.5–15.5)
WBC Count: 2.9 10*3/uL — ABNORMAL LOW (ref 4.0–10.5)
nRBC: 0 % (ref 0.0–0.2)

## 2024-02-27 LAB — CMP (CANCER CENTER ONLY)
ALT: 17 U/L (ref 0–44)
AST: 18 U/L (ref 15–41)
Albumin: 4.5 g/dL (ref 3.5–5.0)
Alkaline Phosphatase: 56 U/L (ref 38–126)
Anion gap: 7 (ref 5–15)
BUN: 17 mg/dL (ref 8–23)
CO2: 27 mmol/L (ref 22–32)
Calcium: 9.1 mg/dL (ref 8.9–10.3)
Chloride: 106 mmol/L (ref 98–111)
Creatinine: 1.16 mg/dL (ref 0.61–1.24)
GFR, Estimated: >60 mL/min (ref >60)
Glucose, Bld: 163 mg/dL — ABNORMAL HIGH (ref 70–99)
Potassium: 3.9 mmol/L (ref 3.5–5.1)
Sodium: 140 mmol/L (ref 135–145)
Total Bilirubin: 0.8 mg/dL (ref 0.0–1.2)
Total Protein: 6.5 g/dL (ref 6.5–8.1)

## 2024-02-27 LAB — LACTATE DEHYDROGENASE: LDH: 175 U/L (ref 98–192)

## 2024-02-27 LAB — VITAMIN B12: Vitamin B-12: 463 pg/mL (ref 180–914)

## 2024-02-27 LAB — FERRITIN: Ferritin: 21 ng/mL — ABNORMAL LOW (ref 24–336)

## 2024-02-27 LAB — FOLATE: Folate: 37.9 ng/mL (ref >5.9)

## 2024-02-27 NOTE — Progress Notes (Signed)
 Hematology and Oncology Follow Up Visit  Brett Sims [id number] Sep 29, 1946 78 y.o. 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 is back for follow-up.    Today he reports that he has been doing well. He has no concerns. He does ask to be discharged from the practice as he wishes to focus his time and medical care with his PCP and infectious disease provider.   He sees his infectious disease doctor in the fall for his yearly follow up. His HIV levels have been stable and undetectable for the past 6 months  He has a history of mild splenomegaly and hepatic steatosis. Tylenol  use is rare. ETOH use is rare. Has history of elevated triglycerides. No abdominal pain, swelling or yellowing of the skin.   He is good about getting his colonoscopy performed every 5 years as recommended by GI.   He has had no problems with bleeding.  There is been no rashes.  He has had no fever.  He has had no nausea or vomiting.  There is been no change in bowel or bladder habits.  He has had no swollen lymph nodes.  Overall, I was his performance status is probably ECOG 0.   Wt Readings from Last 3 Encounters:  02/27/24 185 lb 0.6 oz (83.9 kg)  02/13/24 185 lb (83.9 kg)  08/22/23 192 lb (87.1 kg)    Medications:  Current Outpatient Medications:    allopurinol  (ZYLOPRIM ) 100 MG tablet, Take 200 mg by mouth daily., Disp: , Rfl: 3   brimonidine -timolol  (COMBIGAN ) 0.2-0.5 % ophthalmic solution, Apply 1 drop to eye 2 (two) times daily., Disp: , Rfl:    carboxymethylcellulose (REFRESH PLUS) 0.5 % SOLN, Place 1 drop into both eyes 3 (three) times daily as needed (dry eyes)., Disp: , Rfl:    CLARAVIS 20 MG capsule, Take 20 mg by mouth 3 (three) times a week., Disp: , Rfl:    cyanocobalamin  (VITAMIN B12) 1000 MCG tablet, 1 tablet Orally Once a day for 30 day(s), Disp: , Rfl:     elvitegravir-cobicistat-emtricitabine-tenofovir (GENVOYA ) 150-150-200-10 MG TABS tablet, TAKE ONE TABLET BY MOUTH ONCE DAILY WITH FOOD. STORE IN ORIGINAL CONTAINER AT ROOM TEMPERATURE., Disp: 30 tablet, Rfl: 7   ibuprofen  (ADVIL ) 800 MG tablet, Take by mouth., Disp: , Rfl:    ketoconazole (NIZORAL) 2 % cream, Apply 1 application  topically daily as needed for irritation., Disp: , Rfl:    loperamide (IMODIUM A-D) 2 MG tablet, Take 2 mg by mouth every morning. , Disp: , Rfl:    Multiple Vitamin (MULTIVITAMIN) tablet, Take 1 tablet by mouth every morning. , Disp: , Rfl:    omeprazole  (PRILOSEC) 40 MG capsule, Take 40 mg by mouth every morning., Disp: , Rfl:    pregabalin  (LYRICA ) 50 MG capsule, Take 50 mg by mouth 2 (two) times daily., Disp: , Rfl:    ROCKLATAN 0.02-0.005 % SOLN, Apply 1 drop to eye at bedtime., Disp: , Rfl:    tadalafil (CIALIS) 5 MG tablet, 1 tablets daily and an additional up 3 tablets as needed for sexual intercourse Orally Once a day for 30 days, Disp: , Rfl:    tamsulosin  (FLOMAX ) 0.4 MG CAPS capsule, Take 0.4 mg by mouth 2 (two) times daily., Disp: , Rfl:    telmisartan (MICARDIS) 20 MG tablet, 1 Orally Once a day, prior to bedtime for 90 days,  Disp: , Rfl:    colchicine 0.6 MG tablet, Take 0.6 mg by mouth 2 (two) times daily. (Patient not taking: Reported on 02/27/2024), Disp: , Rfl:    latanoprost  (XALATAN ) 0.005 % ophthalmic solution, Place 1 drop into both eyes at bedtime.  (Patient not taking: Reported on 02/13/2024), Disp: , Rfl:    Polyvinyl Alcohol-Povidone PF 1.4-0.6 % SOLN, 1 drop into affected eye as needed Ophthalmic 24 time(s) a day (Patient not taking: Reported on 02/27/2024), Disp: , Rfl:    predniSONE (STERAPRED UNI-PAK 21 TAB) 10 MG (21) TBPK tablet, Take by mouth as directed. (Patient not taking: Reported on 08/09/2023), Disp: , Rfl:   Allergies:  Allergies  Allergen Reactions   Erythromycin Base Other (See Comments)    Other Reaction(s): Angioedema    Ciprofloxacin  Nausea And Vomiting   Codeine Other (See Comments)    Mood changes    Erythromycin Nausea And Vomiting   Sulfonamide Derivatives Nausea And Vomiting   Sulfamethoxazole Rash    Past Medical History, Surgical history, Social history, and Family History were reviewed and updated.  Review of Systems: Review of Systems  Constitutional: Negative.   HENT:  Negative.    Eyes: Negative.   Respiratory: Negative.    Cardiovascular: Negative.   Gastrointestinal: Negative.   Endocrine: Negative.   Genitourinary: Negative.    Musculoskeletal: Negative.   Skin: Negative.   Neurological: Negative.   Hematological: Negative.   Psychiatric/Behavioral: Negative.      Physical Exam:  height is 5\' 10"  (1.778 m) and weight is 185 lb 0.6 oz (83.9 kg). His oral temperature is 98.2 F (36.8 C). His blood pressure is 123/65 and his pulse is 72. His respiration is 18 and oxygen saturation is 97%.   Wt Readings from Last 3 Encounters:  02/27/24 185 lb 0.6 oz (83.9 kg)  02/13/24 185 lb (83.9 kg)  08/22/23 192 lb (87.1 kg)    Physical Exam Vitals reviewed.  HENT:     Head: Normocephalic and atraumatic.  Eyes:     Pupils: Pupils are equal, round, and reactive to light.  Cardiovascular:     Rate and Rhythm: Normal rate and regular rhythm.     Heart sounds: Normal heart sounds.  Pulmonary:     Effort: Pulmonary effort is normal.     Breath sounds: Normal breath sounds.  Abdominal:     General: Bowel sounds are normal.     Palpations: Abdomen is soft.  Musculoskeletal:        General: No tenderness or deformity. Normal range of motion.     Cervical back: Normal range of motion.  Lymphadenopathy:     Cervical: No cervical adenopathy.  Skin:    General: Skin is warm and dry.     Findings: No erythema or rash.  Neurological:     Mental Status: He is alert and oriented to person, place, and time.  Psychiatric:        Behavior: Behavior normal.        Thought Content: Thought  content normal.        Judgment: Judgment normal.      Lab Results  Component Value Date   WBC 2.9 (L) 02/27/2024   HGB 12.5 (L) 02/27/2024   HCT 34.7 (L) 02/27/2024   MCV 80.9 02/27/2024   PLT 86 (L) 02/27/2024     Chemistry      Component Value Date/Time   NA 139 08/09/2023 0929   K 4.1 08/09/2023 0929   CL 105  08/09/2023 0929   CO2 27 08/09/2023 0929   BUN 15 08/09/2023 0929   CREATININE 1.10 08/09/2023 0929   CREATININE 1.11 08/09/2022 0940      Component Value Date/Time   CALCIUM 8.9 08/09/2023 0929   ALKPHOS 61 08/09/2023 0929   AST 26 08/09/2023 0929   ALT 33 08/09/2023 0929   BILITOT 1.0 08/09/2023 2952     Encounter Diagnoses  Name Primary?   Pancytopenia (HCC) Yes   Hepatic steatosis    Asymptomatic HIV infection, with no history of HIV-related illness (HCC)    Stage 3 chronic kidney disease, unspecified whether stage 3a or 3b CKD (HCC)    Impression and Plan: Mr. Galan is a very nice 78 year old white male.  He has mild pancytopenia.  He does have HIV but this appears to be very well controlled.  He does have one of the mutations that we can see with myelodysplasia.  Per Dr. Lenard Quam he may have a "pre" myelodysplastic conditions.  Again today his labs are overall fairly stable.  Platelets 86 WBC 2.9 ANC 2.0 Hgb12.5  No changes to plan. Continue specialist follow up   Disposition: Pt to schedule   Sharla Davis, PA-C 5/20/20251:40 PM

## 2024-02-28 ENCOUNTER — Ambulatory Visit: Payer: Medicare PPO | Admitting: Internal Medicine

## 2024-02-28 LAB — IRON AND IRON BINDING CAPACITY (CC-WL,HP ONLY)
Iron: 60 ug/dL (ref 45–182)
Saturation Ratios: 14 % — ABNORMAL LOW (ref 17.9–39.5)
TIBC: 441 ug/dL (ref 250–450)
UIBC: 381 ug/dL — ABNORMAL HIGH (ref 117–376)

## 2024-04-02 DIAGNOSIS — L570 Actinic keratosis: Secondary | ICD-10-CM | POA: Diagnosis not present

## 2024-04-02 DIAGNOSIS — L738 Other specified follicular disorders: Secondary | ICD-10-CM | POA: Diagnosis not present

## 2024-04-02 DIAGNOSIS — Z85828 Personal history of other malignant neoplasm of skin: Secondary | ICD-10-CM | POA: Diagnosis not present

## 2024-04-15 ENCOUNTER — Other Ambulatory Visit: Payer: Self-pay | Admitting: Internal Medicine

## 2024-04-15 DIAGNOSIS — B20 Human immunodeficiency virus [HIV] disease: Secondary | ICD-10-CM

## 2024-06-17 DIAGNOSIS — H401123 Primary open-angle glaucoma, left eye, severe stage: Secondary | ICD-10-CM | POA: Diagnosis not present

## 2024-06-17 DIAGNOSIS — H0102B Squamous blepharitis left eye, upper and lower eyelids: Secondary | ICD-10-CM | POA: Diagnosis not present

## 2024-06-17 DIAGNOSIS — H0102A Squamous blepharitis right eye, upper and lower eyelids: Secondary | ICD-10-CM | POA: Diagnosis not present

## 2024-06-17 DIAGNOSIS — H401111 Primary open-angle glaucoma, right eye, mild stage: Secondary | ICD-10-CM | POA: Diagnosis not present

## 2024-07-30 DIAGNOSIS — L738 Other specified follicular disorders: Secondary | ICD-10-CM | POA: Diagnosis not present

## 2024-07-30 DIAGNOSIS — L821 Other seborrheic keratosis: Secondary | ICD-10-CM | POA: Diagnosis not present

## 2024-07-30 DIAGNOSIS — L570 Actinic keratosis: Secondary | ICD-10-CM | POA: Diagnosis not present

## 2024-07-30 DIAGNOSIS — Z85828 Personal history of other malignant neoplasm of skin: Secondary | ICD-10-CM | POA: Diagnosis not present

## 2024-08-01 DIAGNOSIS — Z79899 Other long term (current) drug therapy: Secondary | ICD-10-CM | POA: Diagnosis not present

## 2024-08-01 DIAGNOSIS — M109 Gout, unspecified: Secondary | ICD-10-CM | POA: Diagnosis not present

## 2024-08-01 DIAGNOSIS — N529 Male erectile dysfunction, unspecified: Secondary | ICD-10-CM | POA: Diagnosis not present

## 2024-08-01 DIAGNOSIS — Z0000 Encounter for general adult medical examination without abnormal findings: Secondary | ICD-10-CM | POA: Diagnosis not present

## 2024-08-01 DIAGNOSIS — E781 Pure hyperglyceridemia: Secondary | ICD-10-CM | POA: Diagnosis not present

## 2024-08-01 DIAGNOSIS — Z1331 Encounter for screening for depression: Secondary | ICD-10-CM | POA: Diagnosis not present

## 2024-08-01 DIAGNOSIS — K219 Gastro-esophageal reflux disease without esophagitis: Secondary | ICD-10-CM | POA: Diagnosis not present

## 2024-08-01 DIAGNOSIS — R7303 Prediabetes: Secondary | ICD-10-CM | POA: Diagnosis not present

## 2024-08-01 DIAGNOSIS — N400 Enlarged prostate without lower urinary tract symptoms: Secondary | ICD-10-CM | POA: Diagnosis not present

## 2024-08-01 DIAGNOSIS — D510 Vitamin B12 deficiency anemia due to intrinsic factor deficiency: Secondary | ICD-10-CM | POA: Diagnosis not present

## 2024-08-01 DIAGNOSIS — I10 Essential (primary) hypertension: Secondary | ICD-10-CM | POA: Diagnosis not present

## 2024-08-01 DIAGNOSIS — Z21 Asymptomatic human immunodeficiency virus [HIV] infection status: Secondary | ICD-10-CM | POA: Diagnosis not present

## 2024-08-21 DIAGNOSIS — J208 Acute bronchitis due to other specified organisms: Secondary | ICD-10-CM | POA: Diagnosis not present

## 2024-10-01 ENCOUNTER — Other Ambulatory Visit: Payer: Self-pay | Admitting: Internal Medicine

## 2024-10-01 DIAGNOSIS — B20 Human immunodeficiency virus [HIV] disease: Secondary | ICD-10-CM

## 2024-10-08 ENCOUNTER — Other Ambulatory Visit: Payer: Self-pay

## 2024-10-08 DIAGNOSIS — Z79899 Other long term (current) drug therapy: Secondary | ICD-10-CM

## 2024-10-08 DIAGNOSIS — B20 Human immunodeficiency virus [HIV] disease: Secondary | ICD-10-CM

## 2024-10-09 ENCOUNTER — Other Ambulatory Visit

## 2024-10-09 ENCOUNTER — Other Ambulatory Visit: Payer: Self-pay

## 2024-10-09 DIAGNOSIS — Z79899 Other long term (current) drug therapy: Secondary | ICD-10-CM

## 2024-10-09 DIAGNOSIS — B20 Human immunodeficiency virus [HIV] disease: Secondary | ICD-10-CM

## 2024-10-13 LAB — LIPID PANEL
Cholesterol: 113 mg/dL
HDL: 32 mg/dL — ABNORMAL LOW
LDL Cholesterol (Calc): 49 mg/dL
Non-HDL Cholesterol (Calc): 81 mg/dL
Total CHOL/HDL Ratio: 3.5 (calc)
Triglycerides: 273 mg/dL — ABNORMAL HIGH

## 2024-10-13 LAB — CBC WITH DIFFERENTIAL/PLATELET
Absolute Lymphocytes: 632 {cells}/uL — ABNORMAL LOW (ref 850–3900)
Absolute Monocytes: 180 {cells}/uL — ABNORMAL LOW (ref 200–950)
Basophils Absolute: 9 {cells}/uL (ref 0–200)
Basophils Relative: 0.3 %
Eosinophils Absolute: 81 {cells}/uL (ref 15–500)
Eosinophils Relative: 2.6 %
HCT: 35.8 % — ABNORMAL LOW (ref 39.4–51.1)
Hemoglobin: 11.5 g/dL — ABNORMAL LOW (ref 13.2–17.1)
MCH: 27.6 pg (ref 27.0–33.0)
MCHC: 32.1 g/dL (ref 31.6–35.4)
MCV: 86.1 fL (ref 81.4–101.7)
MPV: 10.5 fL (ref 7.5–12.5)
Monocytes Relative: 5.8 %
Neutro Abs: 2198 {cells}/uL (ref 1500–7800)
Neutrophils Relative %: 70.9 %
Platelets: 88 Thousand/uL — ABNORMAL LOW (ref 140–400)
RBC: 4.16 Million/uL — ABNORMAL LOW (ref 4.20–5.80)
RDW: 15.5 % — ABNORMAL HIGH (ref 11.0–15.0)
Total Lymphocyte: 20.4 %
WBC: 3.1 Thousand/uL — ABNORMAL LOW (ref 3.8–10.8)

## 2024-10-13 LAB — T-HELPER CELLS (CD4) COUNT (NOT AT ARMC)
Absolute CD4: 206 {cells}/uL — ABNORMAL LOW (ref 490–1740)
CD4 T Helper %: 30 % (ref 30–61)
Total lymphocyte count: 690 {cells}/uL — ABNORMAL LOW (ref 850–3900)

## 2024-10-13 LAB — COMPLETE METABOLIC PANEL WITHOUT GFR
AG Ratio: 2 (calc) (ref 1.0–2.5)
ALT: 16 U/L (ref 9–46)
AST: 15 U/L (ref 10–35)
Albumin: 3.8 g/dL (ref 3.6–5.1)
Alkaline phosphatase (APISO): 56 U/L (ref 35–144)
BUN: 16 mg/dL (ref 7–25)
CO2: 27 mmol/L (ref 20–32)
Calcium: 8.6 mg/dL (ref 8.6–10.3)
Chloride: 105 mmol/L (ref 98–110)
Creat: 1.08 mg/dL (ref 0.70–1.28)
Globulin: 1.9 g/dL (ref 1.9–3.7)
Glucose, Bld: 231 mg/dL — ABNORMAL HIGH (ref 65–99)
Potassium: 4.5 mmol/L (ref 3.5–5.3)
Sodium: 140 mmol/L (ref 135–146)
Total Bilirubin: 0.7 mg/dL (ref 0.2–1.2)
Total Protein: 5.7 g/dL — ABNORMAL LOW (ref 6.1–8.1)

## 2024-10-13 LAB — HIV-1 RNA QUANT-NO REFLEX-BLD
HIV 1 RNA Quant: NOT DETECTED {copies}/mL
HIV-1 RNA Quant, Log: NOT DETECTED {Log_copies}/mL

## 2024-10-17 ENCOUNTER — Ambulatory Visit: Payer: Self-pay | Admitting: Internal Medicine

## 2024-11-06 ENCOUNTER — Encounter: Payer: Self-pay | Admitting: Internal Medicine

## 2024-11-06 ENCOUNTER — Ambulatory Visit: Admitting: Internal Medicine

## 2024-11-06 ENCOUNTER — Other Ambulatory Visit: Payer: Self-pay

## 2024-11-06 VITALS — BP 146/75 | HR 68 | Temp 98.4°F | Ht 70.0 in | Wt 192.0 lb

## 2024-11-06 DIAGNOSIS — E785 Hyperlipidemia, unspecified: Secondary | ICD-10-CM

## 2024-11-06 DIAGNOSIS — B20 Human immunodeficiency virus [HIV] disease: Secondary | ICD-10-CM | POA: Diagnosis not present

## 2024-11-06 MED ORDER — GENVOYA 150-150-200-10 MG PO TABS: 1.0000 | ORAL_TABLET | Freq: Every day | ORAL | 11 refills | Status: AC

## 2024-11-06 NOTE — Patient Instructions (Signed)
 Please review this: Statin medication (atoravastatin, pivastatin, rosuvastatin) The study is reprieve  Google reprieve hiv nejm will give you a study to see why we recommend statin medication for my hiv patient above age 77  Labs look great   See me again 11 months

## 2024-11-06 NOTE — Progress Notes (Signed)
 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   CKD (chronic kidney disease), stage III (HCC) 07/04/2018   Elevated liver enzymes 07/04/2018   Glaucoma 07/06/2017   Peripheral neuropathy 01/05/2016   Gout 07/07/2015   Erectile dysfunction 05/20/2014   Cholecystitis with cholelithiasis 04/24/2014   HYPERGLYCEMIA 04/09/2009   HYPERLIPIDEMIA 12/31/2007   HIV (human immunodeficiency virus infection) (HCC) 06/12/2007   GERD 06/12/2007   BENIGN PROSTATIC HYPERTROPHY 06/12/2007   History of respiratory system disease 06/12/2007   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   BRIMONIDINE -TIMOLOL  (COMBIGAN) 0.2-0.5 % OPHTHALMIC SOLUTION    Apply 1 drop to eye 2 (two) times daily.   CARBOXYMETHYLCELLULOSE (REFRESH PLUS) 0.5 % SOLN    Place 1 drop into both eyes 3 (three) times daily as needed (dry eyes).   CLARAVIS 20 MG CAPSULE    Take 20 mg by mouth 3 (three) times a week.   COLCHICINE 0.6 MG TABLET    Take 0.6 mg by mouth 2 (two) times daily.   CYANOCOBALAMIN  (VITAMIN B12) 1000 MCG TABLET    1 tablet Orally Once a day for 30 day(s)   GENVOYA  150-150-200-10 MG TABS TABLET    TAKE 1 TABLET BY MOUTH ONCE DAILY WITH FOOD. STORE IN ORIGINAL CONTAINER AT ROOM TEMPERATURE.   IBUPROFEN  (ADVIL ) 800 MG TABLET    Take by mouth.   KETOCONAZOLE (NIZORAL) 2 % CREAM    Apply 1 application  topically daily as needed for irritation.   LATANOPROST  (XALATAN ) 0.005 % OPHTHALMIC SOLUTION    Place 1 drop into both eyes at bedtime.    LOPERAMIDE (IMODIUM A-D) 2 MG TABLET    Take 2 mg by mouth every morning.    MULTIPLE VITAMIN (MULTIVITAMIN) TABLET    Take 1 tablet by mouth every morning.    OMEPRAZOLE (PRILOSEC) 40 MG CAPSULE    Take 40 mg by mouth every  morning.   POLYVINYL ALCOHOL-POVIDONE PF 1.4-0.6 % SOLN    1 drop into affected eye as needed Ophthalmic 24 time(s) a day   PREDNISONE (STERAPRED UNI-PAK 21 TAB) 10 MG (21) TBPK TABLET    Take by mouth as directed.   PREGABALIN  (LYRICA ) 50 MG CAPSULE    Take 50 mg by mouth 2 (two) times daily.   ROCKLATAN 0.02-0.005 % SOLN    Apply 1 drop to eye at bedtime.   TADALAFIL (CIALIS) 5 MG TABLET    1 tablets daily and an additional up 3 tablets as needed for sexual intercourse Orally Once a day for 30 days   TAMSULOSIN  (FLOMAX ) 0.4 MG CAPS CAPSULE    Take 0.4 mg by mouth 2 (two) times daily.   TELMISARTAN (MICARDIS) 20 MG TABLET    1 Orally Once a day, prior to bedtime for 90 days  Modified Medications   No medications on file  Discontinued Medications   No medications on file    Subjective: Zell is in for his routine HIV follow-up visit.  He denies any problems obtaining, taking or tolerating his Genvoya  and has not been missing doses.  He is feeling well.  He has had an annual influenza vaccine and the updated COVID vaccines.  He is currently  seeing Dr. Jeralyn Crease at the cancer center for his pancytopenia.  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  He is going to be seen in 6 months.  He has continued to follow-up with his urologist for his left renal oncocytoma.  He tells me that it has not been increasing in size and his oncologist has said that he can follow-up in 5 years.  He continues to be bothered by intermittent acid reflux symptoms but has no difficulty swallowing, specifically he does not have any difficulty getting his Genvoya  down.   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 Compliant with genvoya  no missed dose last 4 weeks  Open angle glaucoma Gout Erectyle  dysfunction Bph Gerd Hypothyroidism Peripheral neuropathy B12 deficiency on oral replacement; 01/2022 normal level   Per patient he is seeing urology every other year - he is not taking anything for it  He is seeing dr Crease every 6 months for myelodysplasia  He needs his shingrix 2nd shot next month with pcp    02/13/24 id clinic f/u Doing well Just had a ct scan for a renal lesion with his urologist He has been seeing dr frank for pancytopenia  Reviewed labs last visit He would like anal pap today   11/06/24 id clinic f/u Followed by oncology for thrombocytopenia Stable moderately depressed platelet Feels well Wants to stay on genvoya  and compliant No complaint today    Review of Systems: ROS No complaint today   Past Medical History:  Diagnosis Date   Cholecystolithiasis    Complication of anesthesia    GERD (gastroesophageal reflux disease)    HIV disease (HCC)    PONV (postoperative nausea and vomiting)     Social History   Tobacco Use   Smoking status: Never   Smokeless tobacco: Never  Vaping Use   Vaping status: Never Used  Substance Use Topics   Alcohol use: Not Currently    Comment: occassionally   Drug use: No    No family history on file.  Allergies  Allergen Reactions   Erythromycin Base Other (See Comments)    Other Reaction(s): Angioedema   Ciprofloxacin Nausea And Vomiting   Codeine Other (See Comments)    Mood changes    Erythromycin Nausea And Vomiting   Sulfonamide Derivatives Nausea And Vomiting   Sulfamethoxazole Rash    Health Maintenance  Topic Date Due   Zoster Vaccines- Shingrix (1 of 2) 04/23/1965   COVID-19 Vaccine (8 - Pfizer risk 2025-26 season) 01/21/2025   Medicare Annual Wellness (AWV)  08/01/2025   DTaP/Tdap/Td (2 - Td or Tdap) 06/05/2033   Pneumococcal Vaccine: 50+ Years  Completed   Influenza Vaccine  Completed   Hepatitis C Screening  Completed   Meningococcal B Vaccine  Aged Out   Hepatitis B  Vaccines 19-59 Average Risk  Discontinued   Colonoscopy  Discontinued    Objective:  Vitals:   11/06/24 1115  BP: (!) 146/75  Pulse: 68  Temp: 98.4 F (36.9 C)  TempSrc: Oral  SpO2: 98%  Weight: 192 lb (87.1 kg)  Height: 5' 10 (1.778 m)   Body mass index is 27.55 kg/m.  Physical Exam Constitutional:      Comments: His spirits are good as usual.  Cardiovascular:     Rate and Rhythm: Normal rate and regular rhythm.  Heart sounds: No murmur heard. Pulmonary:     Effort: Pulmonary effort is normal.     Breath sounds: Normal breath sounds.  Psychiatric:        Mood and Affect: Mood normal.     Lab Results Lab Results  Component Value Date   WBC 3.1 (L) 10/09/2024   HGB 11.5 (L) 10/09/2024   HCT 35.8 (L) 10/09/2024   MCV 86.1 10/09/2024   PLT 88 (L) 10/09/2024    Lab Results  Component Value Date   CREATININE 1.08 10/09/2024   BUN 16 10/09/2024   NA 140 10/09/2024   K 4.5 10/09/2024   CL 105 10/09/2024   CO2 27 10/09/2024    Lab Results  Component Value Date   ALT 16 10/09/2024   AST 15 10/09/2024   ALKPHOS 56 02/27/2024   BILITOT 0.7 10/09/2024    Lab Results  Component Value Date   CHOL 113 10/09/2024   HDL 32 (L) 10/09/2024   LDLCALC 49 10/09/2024   TRIG 273 (H) 10/09/2024   CHOLHDL 3.5 10/09/2024   Lab Results  Component Value Date   LABRPR NON-REACTIVE 08/15/2023   HIV 1 RNA Quant  Date Value  10/09/2024 NOT DETECTED copies/mL  08/15/2023 Not Detected Copies/mL  08/09/2022 <20 Copies/mL (H)   CD4 T Cell Abs (/uL)  Date Value  08/15/2023 319 (L)  08/09/2022 309 (L)  08/18/2021 283 (L)   08/2023 34%   Problem List Items Addressed This Visit   None Visit Diagnoses       HIV disease (HCC)    -  Primary   Relevant Medications   elvitegravir-cobicistat-emtricitabine-tenofovir (GENVOYA ) 150-150-200-10 MG TABS tablet     Hyperlipidemia, unspecified hyperlipidemia type            #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  Discussed ddi with genvoya  and other potentials -- he wants to continue on it now rather than biktarvy   11/06/24 cd4 percentage a little low. Pancytopenia has anything to do with this? 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

## 2025-09-11 ENCOUNTER — Other Ambulatory Visit: Payer: Self-pay

## 2025-09-25 ENCOUNTER — Ambulatory Visit: Payer: Self-pay | Admitting: Internal Medicine
